# Patient Record
Sex: Female | Born: 1945 | Race: White | Hispanic: No | Marital: Married | Smoking: Never smoker
Health system: Southern US, Community
[De-identification: ages and names within clinical notes are randomized; demographics above are authoritative.]

## PROBLEM LIST (undated history)

## (undated) DIAGNOSIS — M1990 Unspecified osteoarthritis, unspecified site: Secondary | ICD-10-CM

## (undated) DIAGNOSIS — M545 Low back pain, unspecified: Secondary | ICD-10-CM

## (undated) DIAGNOSIS — T4145XA Adverse effect of unspecified anesthetic, initial encounter: Secondary | ICD-10-CM

## (undated) DIAGNOSIS — I10 Essential (primary) hypertension: Secondary | ICD-10-CM

## (undated) DIAGNOSIS — N19 Unspecified kidney failure: Secondary | ICD-10-CM

## (undated) DIAGNOSIS — Z8709 Personal history of other diseases of the respiratory system: Secondary | ICD-10-CM

## (undated) DIAGNOSIS — K429 Umbilical hernia without obstruction or gangrene: Secondary | ICD-10-CM

## (undated) DIAGNOSIS — R600 Localized edema: Secondary | ICD-10-CM

## (undated) DIAGNOSIS — T8859XA Other complications of anesthesia, initial encounter: Secondary | ICD-10-CM

## (undated) DIAGNOSIS — Z8674 Personal history of sudden cardiac arrest: Secondary | ICD-10-CM

## (undated) DIAGNOSIS — K219 Gastro-esophageal reflux disease without esophagitis: Secondary | ICD-10-CM

## (undated) DIAGNOSIS — T849XXA Unspecified complication of internal orthopedic prosthetic device, implant and graft, initial encounter: Secondary | ICD-10-CM

## (undated) DIAGNOSIS — J189 Pneumonia, unspecified organism: Secondary | ICD-10-CM

## (undated) DIAGNOSIS — G629 Polyneuropathy, unspecified: Secondary | ICD-10-CM

## (undated) DIAGNOSIS — Z8719 Personal history of other diseases of the digestive system: Secondary | ICD-10-CM

## (undated) DIAGNOSIS — F32A Depression, unspecified: Secondary | ICD-10-CM

## (undated) DIAGNOSIS — F329 Major depressive disorder, single episode, unspecified: Secondary | ICD-10-CM

## (undated) DIAGNOSIS — I639 Cerebral infarction, unspecified: Secondary | ICD-10-CM

## (undated) DIAGNOSIS — Z8619 Personal history of other infectious and parasitic diseases: Secondary | ICD-10-CM

## (undated) HISTORY — DX: Depression, unspecified: F32.A

## (undated) HISTORY — DX: Essential (primary) hypertension: I10

## (undated) HISTORY — PX: KNEE ARTHROSCOPY: SUR90

## (undated) HISTORY — DX: Unspecified osteoarthritis, unspecified site: M19.90

## (undated) HISTORY — DX: Localized edema: R60.0

## (undated) HISTORY — DX: Unspecified kidney failure: N19

## (undated) HISTORY — DX: Gastro-esophageal reflux disease without esophagitis: K21.9

## (undated) HISTORY — DX: Major depressive disorder, single episode, unspecified: F32.9

## (undated) HISTORY — PX: TONSILLECTOMY: SUR1361

## (undated) HISTORY — PX: GASTRIC BYPASS: SHX52

---

## 1991-01-30 HISTORY — PX: ABDOMINAL HYSTERECTOMY: SHX81

## 1999-05-09 ENCOUNTER — Other Ambulatory Visit: Admission: RE | Admit: 1999-05-09 | Discharge: 1999-05-09 | Payer: Self-pay | Admitting: Obstetrics & Gynecology

## 1999-05-10 ENCOUNTER — Encounter: Payer: Self-pay | Admitting: Obstetrics & Gynecology

## 1999-05-10 ENCOUNTER — Ambulatory Visit (HOSPITAL_COMMUNITY): Admission: RE | Admit: 1999-05-10 | Discharge: 1999-05-10 | Payer: Self-pay | Admitting: Obstetrics & Gynecology

## 2000-07-04 ENCOUNTER — Encounter: Admission: RE | Admit: 2000-07-04 | Discharge: 2000-10-02 | Payer: Self-pay | Admitting: Internal Medicine

## 2000-10-10 ENCOUNTER — Encounter: Payer: Self-pay | Admitting: Internal Medicine

## 2000-10-10 ENCOUNTER — Ambulatory Visit (HOSPITAL_COMMUNITY): Admission: RE | Admit: 2000-10-10 | Discharge: 2000-10-10 | Payer: Self-pay | Admitting: Internal Medicine

## 2001-01-20 ENCOUNTER — Ambulatory Visit (HOSPITAL_BASED_OUTPATIENT_CLINIC_OR_DEPARTMENT_OTHER): Admission: RE | Admit: 2001-01-20 | Discharge: 2001-01-20 | Payer: Self-pay | Admitting: *Deleted

## 2001-06-02 ENCOUNTER — Ambulatory Visit (HOSPITAL_BASED_OUTPATIENT_CLINIC_OR_DEPARTMENT_OTHER): Admission: RE | Admit: 2001-06-02 | Discharge: 2001-06-02 | Payer: Self-pay | Admitting: Orthopedic Surgery

## 2001-10-22 ENCOUNTER — Encounter: Payer: Self-pay | Admitting: Internal Medicine

## 2001-10-22 ENCOUNTER — Ambulatory Visit (HOSPITAL_COMMUNITY): Admission: RE | Admit: 2001-10-22 | Discharge: 2001-10-22 | Payer: Self-pay | Admitting: Internal Medicine

## 2003-09-08 ENCOUNTER — Encounter: Admission: RE | Admit: 2003-09-08 | Discharge: 2003-09-08 | Payer: Self-pay | Admitting: Internal Medicine

## 2004-03-22 ENCOUNTER — Encounter: Admission: RE | Admit: 2004-03-22 | Discharge: 2004-03-22 | Payer: Self-pay | Admitting: Internal Medicine

## 2004-03-22 ENCOUNTER — Ambulatory Visit: Payer: Self-pay | Admitting: Internal Medicine

## 2004-04-15 ENCOUNTER — Ambulatory Visit: Payer: Self-pay | Admitting: Family Medicine

## 2004-05-24 ENCOUNTER — Ambulatory Visit: Payer: Self-pay | Admitting: Internal Medicine

## 2004-05-29 HISTORY — PX: INCONTINENCE SURGERY: SHX676

## 2004-05-31 ENCOUNTER — Inpatient Hospital Stay (HOSPITAL_COMMUNITY): Admission: RE | Admit: 2004-05-31 | Discharge: 2004-06-02 | Payer: Self-pay | Admitting: Urology

## 2004-07-06 ENCOUNTER — Ambulatory Visit: Payer: Self-pay | Admitting: Internal Medicine

## 2004-09-08 ENCOUNTER — Ambulatory Visit: Payer: Self-pay | Admitting: Internal Medicine

## 2004-10-07 ENCOUNTER — Ambulatory Visit: Payer: Self-pay | Admitting: Endocrinology

## 2004-10-07 ENCOUNTER — Inpatient Hospital Stay (HOSPITAL_COMMUNITY): Admission: EM | Admit: 2004-10-07 | Discharge: 2004-10-11 | Payer: Self-pay | Admitting: Emergency Medicine

## 2004-10-10 ENCOUNTER — Ambulatory Visit: Payer: Self-pay | Admitting: Internal Medicine

## 2004-10-16 ENCOUNTER — Ambulatory Visit: Payer: Self-pay | Admitting: Internal Medicine

## 2004-10-29 HISTORY — PX: REPLACEMENT TOTAL KNEE: SUR1224

## 2004-11-27 ENCOUNTER — Inpatient Hospital Stay (HOSPITAL_COMMUNITY): Admission: RE | Admit: 2004-11-27 | Discharge: 2004-12-12 | Payer: Self-pay | Admitting: Orthopedic Surgery

## 2004-11-27 ENCOUNTER — Ambulatory Visit: Payer: Self-pay | Admitting: Internal Medicine

## 2004-11-29 ENCOUNTER — Ambulatory Visit: Payer: Self-pay | Admitting: Pulmonary Disease

## 2004-11-29 ENCOUNTER — Ambulatory Visit: Payer: Self-pay | Admitting: Cardiology

## 2004-11-29 ENCOUNTER — Encounter: Payer: Self-pay | Admitting: Cardiology

## 2004-12-01 ENCOUNTER — Ambulatory Visit: Payer: Self-pay | Admitting: Physical Medicine & Rehabilitation

## 2004-12-05 ENCOUNTER — Encounter: Payer: Self-pay | Admitting: Nephrology

## 2004-12-18 ENCOUNTER — Ambulatory Visit: Payer: Self-pay | Admitting: Internal Medicine

## 2004-12-22 ENCOUNTER — Ambulatory Visit: Payer: Self-pay | Admitting: Internal Medicine

## 2004-12-25 ENCOUNTER — Ambulatory Visit: Payer: Self-pay | Admitting: Internal Medicine

## 2004-12-29 ENCOUNTER — Ambulatory Visit: Payer: Self-pay | Admitting: Internal Medicine

## 2005-01-09 ENCOUNTER — Ambulatory Visit: Payer: Self-pay | Admitting: Internal Medicine

## 2005-01-18 ENCOUNTER — Ambulatory Visit: Payer: Self-pay | Admitting: Internal Medicine

## 2005-02-09 ENCOUNTER — Ambulatory Visit: Payer: Self-pay | Admitting: Internal Medicine

## 2005-02-16 ENCOUNTER — Ambulatory Visit: Payer: Self-pay | Admitting: Internal Medicine

## 2005-02-19 ENCOUNTER — Ambulatory Visit: Payer: Self-pay | Admitting: Internal Medicine

## 2005-04-12 ENCOUNTER — Ambulatory Visit: Payer: Self-pay | Admitting: Internal Medicine

## 2005-05-15 ENCOUNTER — Ambulatory Visit: Payer: Self-pay | Admitting: Internal Medicine

## 2005-07-03 ENCOUNTER — Ambulatory Visit: Payer: Self-pay | Admitting: Internal Medicine

## 2005-08-13 ENCOUNTER — Encounter: Admission: RE | Admit: 2005-08-13 | Discharge: 2005-08-13 | Payer: Self-pay | Admitting: Internal Medicine

## 2005-08-13 ENCOUNTER — Ambulatory Visit: Payer: Self-pay | Admitting: Internal Medicine

## 2005-11-01 ENCOUNTER — Ambulatory Visit: Payer: Self-pay | Admitting: Internal Medicine

## 2005-11-01 ENCOUNTER — Encounter: Payer: Self-pay | Admitting: Internal Medicine

## 2005-11-01 DIAGNOSIS — I10 Essential (primary) hypertension: Secondary | ICD-10-CM

## 2005-11-01 DIAGNOSIS — F329 Major depressive disorder, single episode, unspecified: Secondary | ICD-10-CM

## 2005-11-01 DIAGNOSIS — E1149 Type 2 diabetes mellitus with other diabetic neurological complication: Secondary | ICD-10-CM | POA: Insufficient documentation

## 2005-11-06 ENCOUNTER — Ambulatory Visit: Payer: Self-pay | Admitting: Internal Medicine

## 2006-01-29 DIAGNOSIS — I639 Cerebral infarction, unspecified: Secondary | ICD-10-CM

## 2006-01-29 HISTORY — DX: Cerebral infarction, unspecified: I63.9

## 2006-02-20 ENCOUNTER — Ambulatory Visit: Payer: Self-pay | Admitting: Internal Medicine

## 2006-02-20 LAB — CONVERTED CEMR LAB
ALT: 44 units/L — ABNORMAL HIGH (ref 0–40)
AST: 53 units/L — ABNORMAL HIGH (ref 0–37)
Albumin: 3.5 g/dL (ref 3.5–5.2)
Alkaline Phosphatase: 97 units/L (ref 39–117)
BUN: 14 mg/dL (ref 6–23)
Bilirubin, Direct: 0.2 mg/dL (ref 0.0–0.3)
CO2: 30 meq/L (ref 19–32)
Calcium: 9.3 mg/dL (ref 8.4–10.5)
Chloride: 103 meq/L (ref 96–112)
Cholesterol: 93 mg/dL (ref 0–200)
Creatinine, Ser: 1.1 mg/dL (ref 0.4–1.2)
GFR calc Af Amer: 65 mL/min
GFR calc non Af Amer: 54 mL/min
Glucose, Bld: 261 mg/dL — ABNORMAL HIGH (ref 70–99)
HDL: 31 mg/dL — ABNORMAL LOW (ref >39.0)
Hgb A1c MFr Bld: 8.6 % — ABNORMAL HIGH (ref 4.6–6.0)
LDL Cholesterol: 45 mg/dL (ref 0–99)
Potassium: 5.2 meq/L — ABNORMAL HIGH (ref 3.5–5.1)
Sodium: 137 meq/L (ref 135–145)
Total Bilirubin: 0.6 mg/dL (ref 0.3–1.2)
Total CHOL/HDL Ratio: 3
Total Protein: 6.7 g/dL (ref 6.0–8.3)
Triglycerides: 84 mg/dL (ref 0–149)
VLDL: 17 mg/dL (ref 0–40)

## 2006-04-16 ENCOUNTER — Ambulatory Visit: Payer: Self-pay | Admitting: Internal Medicine

## 2006-05-06 ENCOUNTER — Ambulatory Visit: Payer: Self-pay | Admitting: Internal Medicine

## 2006-05-28 ENCOUNTER — Ambulatory Visit: Payer: Self-pay | Admitting: Internal Medicine

## 2006-07-03 ENCOUNTER — Ambulatory Visit: Payer: Self-pay | Admitting: Internal Medicine

## 2006-07-03 LAB — CONVERTED CEMR LAB
ALT: 38 units/L (ref 0–40)
AST: 45 units/L — ABNORMAL HIGH (ref 0–37)
Albumin: 3.4 g/dL — ABNORMAL LOW (ref 3.5–5.2)
Alkaline Phosphatase: 104 units/L (ref 39–117)
BUN: 16 mg/dL (ref 6–23)
Bilirubin, Direct: 0.2 mg/dL (ref 0.0–0.3)
CO2: 30 meq/L (ref 19–32)
Calcium: 9.5 mg/dL (ref 8.4–10.5)
Chloride: 100 meq/L (ref 96–112)
Creatinine, Ser: 0.9 mg/dL (ref 0.4–1.2)
GFR calc Af Amer: 82 mL/min
GFR calc non Af Amer: 68 mL/min
Glucose, Bld: 245 mg/dL — ABNORMAL HIGH (ref 70–99)
Potassium: 4.4 meq/L (ref 3.5–5.1)
Sodium: 137 meq/L (ref 135–145)
Total Bilirubin: 0.8 mg/dL (ref 0.3–1.2)
Total Protein: 7 g/dL (ref 6.0–8.3)

## 2006-07-27 ENCOUNTER — Ambulatory Visit: Payer: Self-pay | Admitting: Internal Medicine

## 2006-08-05 ENCOUNTER — Ambulatory Visit: Payer: Self-pay | Admitting: Internal Medicine

## 2006-10-23 ENCOUNTER — Ambulatory Visit: Payer: Self-pay | Admitting: Internal Medicine

## 2006-10-24 LAB — CONVERTED CEMR LAB
ALT: 45 units/L — ABNORMAL HIGH (ref 0–35)
AST: 55 units/L — ABNORMAL HIGH (ref 0–37)
Albumin: 3.2 g/dL — ABNORMAL LOW (ref 3.5–5.2)
Alkaline Phosphatase: 122 units/L — ABNORMAL HIGH (ref 39–117)
BUN: 14 mg/dL (ref 6–23)
Bilirubin, Direct: 0.2 mg/dL (ref 0.0–0.3)
CO2: 31 meq/L (ref 19–32)
Calcium: 8.9 mg/dL (ref 8.4–10.5)
Chloride: 100 meq/L (ref 96–112)
Cholesterol: 89 mg/dL (ref 0–200)
Creatinine, Ser: 1 mg/dL (ref 0.4–1.2)
GFR calc Af Amer: 73 mL/min
GFR calc non Af Amer: 60 mL/min
Glucose, Bld: 493 mg/dL — ABNORMAL HIGH (ref 70–99)
HDL: 24.1 mg/dL — ABNORMAL LOW (ref >39.0)
Hgb A1c MFr Bld: 11.4 % — ABNORMAL HIGH (ref 4.6–6.0)
LDL Cholesterol: 37 mg/dL (ref 0–99)
Potassium: 4.6 meq/L (ref 3.5–5.1)
Sodium: 136 meq/L (ref 135–145)
Total Bilirubin: 0.7 mg/dL (ref 0.3–1.2)
Total CHOL/HDL Ratio: 3.7
Total Protein: 6.9 g/dL (ref 6.0–8.3)
Triglycerides: 139 mg/dL (ref 0–149)
VLDL: 28 mg/dL (ref 0–40)

## 2006-10-29 ENCOUNTER — Telehealth: Payer: Self-pay | Admitting: Internal Medicine

## 2006-10-29 ENCOUNTER — Ambulatory Visit: Payer: Self-pay | Admitting: Internal Medicine

## 2006-11-12 ENCOUNTER — Ambulatory Visit: Payer: Self-pay | Admitting: Internal Medicine

## 2006-11-27 ENCOUNTER — Ambulatory Visit: Payer: Self-pay | Admitting: Internal Medicine

## 2007-01-09 ENCOUNTER — Ambulatory Visit: Payer: Self-pay | Admitting: Internal Medicine

## 2007-01-24 ENCOUNTER — Telehealth: Payer: Self-pay | Admitting: Internal Medicine

## 2007-02-11 ENCOUNTER — Encounter: Payer: Self-pay | Admitting: Internal Medicine

## 2007-02-28 ENCOUNTER — Ambulatory Visit: Payer: Self-pay | Admitting: Internal Medicine

## 2007-03-03 LAB — CONVERTED CEMR LAB
ALT: 50 units/L — ABNORMAL HIGH (ref 0–35)
AST: 60 units/L — ABNORMAL HIGH (ref 0–37)
Albumin: 3.5 g/dL (ref 3.5–5.2)
BUN: 13 mg/dL (ref 6–23)
Bilirubin, Direct: 0.3 mg/dL (ref 0.0–0.3)
CO2: 29 meq/L (ref 19–32)
Calcium: 9.5 mg/dL (ref 8.4–10.5)
Chloride: 101 meq/L (ref 96–112)
Cholesterol: 92 mg/dL (ref 0–200)
GFR calc Af Amer: 82 mL/min
GFR calc non Af Amer: 68 mL/min
Glucose, Bld: 177 mg/dL — ABNORMAL HIGH (ref 70–99)
HDL: 26.6 mg/dL — ABNORMAL LOW (ref >39.0)
Hgb A1c MFr Bld: 9.4 % — ABNORMAL HIGH (ref 4.6–6.0)
LDL Cholesterol: 52 mg/dL (ref 0–99)
Potassium: 4.2 meq/L (ref 3.5–5.1)
Sodium: 138 meq/L (ref 135–145)
Total Bilirubin: 0.9 mg/dL (ref 0.3–1.2)
Total CHOL/HDL Ratio: 3.5
Total Protein: 7.1 g/dL (ref 6.0–8.3)
Triglycerides: 65 mg/dL (ref 0–149)
VLDL: 13 mg/dL (ref 0–40)

## 2007-03-07 ENCOUNTER — Ambulatory Visit: Payer: Self-pay | Admitting: Internal Medicine

## 2007-05-06 ENCOUNTER — Ambulatory Visit: Payer: Self-pay | Admitting: Internal Medicine

## 2007-06-03 ENCOUNTER — Ambulatory Visit: Payer: Self-pay | Admitting: Internal Medicine

## 2007-06-05 LAB — CONVERTED CEMR LAB
BUN: 13 mg/dL (ref 6–23)
Basophils Absolute: 0 10*3/uL (ref 0.0–0.1)
Basophils Relative: 0.2 % (ref 0.0–1.0)
CO2: 31 meq/L (ref 19–32)
Calcium: 9 mg/dL (ref 8.4–10.5)
Chloride: 103 meq/L (ref 96–112)
Cholesterol: 90 mg/dL (ref 0–200)
Creatinine, Ser: 1 mg/dL (ref 0.4–1.2)
Eosinophils Absolute: 0.3 10*3/uL (ref 0.0–0.7)
Eosinophils Relative: 2.8 % (ref 0.0–5.0)
GFR calc Af Amer: 73 mL/min
GFR calc non Af Amer: 60 mL/min
Glucose, Bld: 153 mg/dL — ABNORMAL HIGH (ref 70–99)
HCT: 36.6 % (ref 36.0–46.0)
HDL: 26.1 mg/dL — ABNORMAL LOW (ref >39.0)
Hemoglobin: 12.5 g/dL (ref 12.0–15.0)
Hgb A1c MFr Bld: 8 % — ABNORMAL HIGH (ref 4.6–6.0)
LDL Cholesterol: 47 mg/dL (ref 0–99)
Lymphocytes Relative: 29.3 % (ref 12.0–46.0)
MCHC: 34.2 g/dL (ref 30.0–36.0)
MCV: 86.2 fL (ref 78.0–100.0)
Monocytes Absolute: 0.4 10*3/uL (ref 0.1–1.0)
Monocytes Relative: 3.6 % (ref 3.0–12.0)
Neutro Abs: 6.2 10*3/uL (ref 1.4–7.7)
Neutrophils Relative %: 64.1 % (ref 43.0–77.0)
Platelets: 430 10*3/uL — ABNORMAL HIGH (ref 150–400)
Potassium: 4.2 meq/L (ref 3.5–5.1)
RBC: 4.24 M/uL (ref 3.87–5.11)
RDW: 13.8 % (ref 11.5–14.6)
Sodium: 139 meq/L (ref 135–145)
Total CHOL/HDL Ratio: 3.4
Triglycerides: 86 mg/dL (ref 0–149)
VLDL: 17 mg/dL (ref 0–40)
WBC: 9.8 10*3/uL (ref 4.5–10.5)

## 2007-06-30 ENCOUNTER — Ambulatory Visit: Payer: Self-pay | Admitting: Internal Medicine

## 2007-07-14 ENCOUNTER — Telehealth: Payer: Self-pay | Admitting: Internal Medicine

## 2007-10-23 ENCOUNTER — Ambulatory Visit: Payer: Self-pay | Admitting: Internal Medicine

## 2007-10-23 DIAGNOSIS — E669 Obesity, unspecified: Secondary | ICD-10-CM

## 2007-10-24 LAB — CONVERTED CEMR LAB
ALT: 42 units/L — ABNORMAL HIGH (ref 0–35)
AST: 44 units/L — ABNORMAL HIGH (ref 0–37)
CO2: 25 meq/L (ref 19–32)
Calcium: 9.6 mg/dL (ref 8.4–10.5)
Chloride: 105 meq/L (ref 96–112)
Cholesterol: 86 mg/dL (ref 0–200)
Creatinine, Ser: 1.1 mg/dL (ref 0.4–1.2)
GFR calc Af Amer: 65 mL/min
GFR calc non Af Amer: 54 mL/min
HDL: 27 mg/dL — ABNORMAL LOW (ref >39.0)
Hgb A1c MFr Bld: 6.7 % — ABNORMAL HIGH (ref 4.6–6.0)
Potassium: 4.1 meq/L (ref 3.5–5.1)
Sodium: 139 meq/L (ref 135–145)
Total CHOL/HDL Ratio: 3.2
Triglycerides: 56 mg/dL (ref 0–149)
VLDL: 11 mg/dL (ref 0–40)

## 2007-10-30 ENCOUNTER — Telehealth: Payer: Self-pay | Admitting: Internal Medicine

## 2008-01-02 ENCOUNTER — Inpatient Hospital Stay (HOSPITAL_COMMUNITY): Admission: EM | Admit: 2008-01-02 | Discharge: 2008-01-03 | Payer: Self-pay | Admitting: Emergency Medicine

## 2008-01-02 ENCOUNTER — Ambulatory Visit: Payer: Self-pay | Admitting: Internal Medicine

## 2008-01-02 ENCOUNTER — Telehealth: Payer: Self-pay | Admitting: Internal Medicine

## 2008-01-05 ENCOUNTER — Encounter (INDEPENDENT_AMBULATORY_CARE_PROVIDER_SITE_OTHER): Payer: Self-pay | Admitting: *Deleted

## 2008-01-05 ENCOUNTER — Ambulatory Visit: Payer: Self-pay | Admitting: Internal Medicine

## 2008-01-05 DIAGNOSIS — I639 Cerebral infarction, unspecified: Secondary | ICD-10-CM | POA: Insufficient documentation

## 2008-01-09 ENCOUNTER — Ambulatory Visit: Payer: Self-pay

## 2008-01-09 ENCOUNTER — Encounter: Payer: Self-pay | Admitting: Internal Medicine

## 2008-02-04 ENCOUNTER — Ambulatory Visit: Payer: Self-pay | Admitting: Internal Medicine

## 2008-02-04 LAB — CONVERTED CEMR LAB
ALT: 36 units/L — ABNORMAL HIGH (ref 0–35)
AST: 44 units/L — ABNORMAL HIGH (ref 0–37)
Albumin: 3.3 g/dL — ABNORMAL LOW (ref 3.5–5.2)
Alkaline Phosphatase: 76 units/L (ref 39–117)
Bilirubin, Direct: 0.2 mg/dL (ref 0.0–0.3)
Cholesterol: 64 mg/dL (ref 0–200)
HDL: 23.1 mg/dL — ABNORMAL LOW (ref >39.0)
Total CHOL/HDL Ratio: 2.8
Total Protein: 6.9 g/dL (ref 6.0–8.3)
Triglycerides: 43 mg/dL (ref 0–149)
VLDL: 9 mg/dL (ref 0–40)

## 2008-02-10 ENCOUNTER — Ambulatory Visit: Payer: Self-pay | Admitting: Internal Medicine

## 2008-02-10 DIAGNOSIS — E785 Hyperlipidemia, unspecified: Secondary | ICD-10-CM

## 2008-03-15 ENCOUNTER — Telehealth: Payer: Self-pay | Admitting: Internal Medicine

## 2008-03-16 ENCOUNTER — Encounter: Payer: Self-pay | Admitting: Internal Medicine

## 2008-04-01 ENCOUNTER — Telehealth: Payer: Self-pay | Admitting: Internal Medicine

## 2008-04-08 ENCOUNTER — Telehealth: Payer: Self-pay | Admitting: Internal Medicine

## 2008-05-18 ENCOUNTER — Ambulatory Visit: Payer: Self-pay | Admitting: Internal Medicine

## 2008-05-18 LAB — CONVERTED CEMR LAB
ALT: 37 units/L — ABNORMAL HIGH (ref 0–35)
Albumin: 3.5 g/dL (ref 3.5–5.2)
BUN: 20 mg/dL (ref 6–23)
Bilirubin, Direct: 0.1 mg/dL (ref 0.0–0.3)
CO2: 28 meq/L (ref 19–32)
Calcium: 9.5 mg/dL (ref 8.4–10.5)
Chloride: 103 meq/L (ref 96–112)
Creatinine, Ser: 1.1 mg/dL (ref 0.4–1.2)
GFR calc non Af Amer: 53.58 mL/min (ref >60)
Glucose, Bld: 172 mg/dL — ABNORMAL HIGH (ref 70–99)
HDL: 30.1 mg/dL — ABNORMAL LOW (ref >39.00)
Potassium: 4.5 meq/L (ref 3.5–5.1)
Total Bilirubin: 0.6 mg/dL (ref 0.3–1.2)
Total CHOL/HDL Ratio: 3
Total Protein: 7.3 g/dL (ref 6.0–8.3)
Triglycerides: 80 mg/dL (ref 0.0–149.0)
VLDL: 16 mg/dL (ref 0.0–40.0)

## 2008-05-25 ENCOUNTER — Ambulatory Visit: Payer: Self-pay | Admitting: Internal Medicine

## 2008-09-15 ENCOUNTER — Ambulatory Visit: Payer: Self-pay | Admitting: Internal Medicine

## 2008-09-15 LAB — CONVERTED CEMR LAB
ALT: 36 units/L — ABNORMAL HIGH (ref 0–35)
AST: 38 units/L — ABNORMAL HIGH (ref 0–37)
Albumin: 3.3 g/dL — ABNORMAL LOW (ref 3.5–5.2)
Alkaline Phosphatase: 78 units/L (ref 39–117)
BUN: 15 mg/dL (ref 6–23)
Bilirubin, Direct: 0.2 mg/dL (ref 0.0–0.3)
CO2: 30 meq/L (ref 19–32)
Calcium: 9.1 mg/dL (ref 8.4–10.5)
Chloride: 108 meq/L (ref 96–112)
Cholesterol: 78 mg/dL (ref 0–200)
GFR calc non Af Amer: 77.29 mL/min (ref >60)
Glucose, Bld: 171 mg/dL — ABNORMAL HIGH (ref 70–99)
HDL: 30.4 mg/dL — ABNORMAL LOW (ref >39.00)
Hgb A1c MFr Bld: 7.5 % — ABNORMAL HIGH (ref 4.6–6.5)
LDL Cholesterol: 33 mg/dL (ref 0–99)
Potassium: 4.4 meq/L (ref 3.5–5.1)
Sodium: 142 meq/L (ref 135–145)
Total Bilirubin: 0.7 mg/dL (ref 0.3–1.2)
Total CHOL/HDL Ratio: 3
Total Protein: 7.1 g/dL (ref 6.0–8.3)
Triglycerides: 75 mg/dL (ref 0.0–149.0)
VLDL: 15 mg/dL (ref 0.0–40.0)

## 2008-09-22 ENCOUNTER — Ambulatory Visit: Payer: Self-pay | Admitting: Internal Medicine

## 2008-10-05 ENCOUNTER — Telehealth: Payer: Self-pay | Admitting: Internal Medicine

## 2008-11-23 ENCOUNTER — Ambulatory Visit: Payer: Self-pay | Admitting: Internal Medicine

## 2008-12-31 ENCOUNTER — Telehealth: Payer: Self-pay | Admitting: Internal Medicine

## 2009-01-03 ENCOUNTER — Ambulatory Visit: Payer: Self-pay | Admitting: Internal Medicine

## 2009-01-05 LAB — CONVERTED CEMR LAB
ALT: 46 units/L — ABNORMAL HIGH (ref 0–35)
AST: 50 units/L — ABNORMAL HIGH (ref 0–37)
Albumin: 3.6 g/dL (ref 3.5–5.2)
Alkaline Phosphatase: 92 units/L (ref 39–117)
BUN: 16 mg/dL (ref 6–23)
Basophils Absolute: 0.1 10*3/uL (ref 0.0–0.1)
Basophils Relative: 0.8 % (ref 0.0–3.0)
Bilirubin, Direct: 0.1 mg/dL (ref 0.0–0.3)
CO2: 29 meq/L (ref 19–32)
Calcium: 9.5 mg/dL (ref 8.4–10.5)
Chloride: 98 meq/L (ref 96–112)
Cholesterol: 91 mg/dL (ref 0–200)
Creatinine, Ser: 1 mg/dL (ref 0.4–1.2)
Eosinophils Absolute: 0.2 10*3/uL (ref 0.0–0.7)
Eosinophils Relative: 1.7 % (ref 0.0–5.0)
GFR calc non Af Amer: 59.69 mL/min (ref >60)
Glucose, Bld: 230 mg/dL — ABNORMAL HIGH (ref 70–99)
HCT: 37.8 % (ref 36.0–46.0)
HDL: 32.2 mg/dL — ABNORMAL LOW (ref >39.00)
Hemoglobin: 12.7 g/dL (ref 12.0–15.0)
Hgb A1c MFr Bld: 10.2 % — ABNORMAL HIGH (ref 4.6–6.5)
LDL Cholesterol: 42 mg/dL (ref 0–99)
Lymphocytes Relative: 29.5 % (ref 12.0–46.0)
Lymphs Abs: 2.8 10*3/uL (ref 0.7–4.0)
MCHC: 33.6 g/dL (ref 30.0–36.0)
MCV: 85.9 fL (ref 78.0–100.0)
Monocytes Absolute: 0.7 10*3/uL (ref 0.1–1.0)
Monocytes Relative: 6.9 % (ref 3.0–12.0)
Neutro Abs: 5.8 10*3/uL (ref 1.4–7.7)
Neutrophils Relative %: 61.1 % (ref 43.0–77.0)
Platelets: 405 10*3/uL — ABNORMAL HIGH (ref 150.0–400.0)
Potassium: 4.4 meq/L (ref 3.5–5.1)
RBC: 4.41 M/uL (ref 3.87–5.11)
RDW: 15.3 % — ABNORMAL HIGH (ref 11.5–14.6)
Sodium: 137 meq/L (ref 135–145)
TSH: 4.08 microintl units/mL (ref 0.35–5.50)
Total Bilirubin: 0.7 mg/dL (ref 0.3–1.2)
Total CHOL/HDL Ratio: 3
Total Protein: 8.3 g/dL (ref 6.0–8.3)
Triglycerides: 84 mg/dL (ref 0.0–149.0)
VLDL: 16.8 mg/dL (ref 0.0–40.0)
WBC: 9.6 10*3/uL (ref 4.5–10.5)

## 2009-01-10 ENCOUNTER — Telehealth: Payer: Self-pay | Admitting: Internal Medicine

## 2009-01-29 LAB — HM DIABETES EYE EXAM: HM Diabetic Eye Exam: NORMAL

## 2009-03-01 ENCOUNTER — Ambulatory Visit: Payer: Self-pay | Admitting: Internal Medicine

## 2009-04-12 DIAGNOSIS — K219 Gastro-esophageal reflux disease without esophagitis: Secondary | ICD-10-CM | POA: Insufficient documentation

## 2009-04-13 ENCOUNTER — Ambulatory Visit: Payer: Self-pay | Admitting: Internal Medicine

## 2009-04-13 LAB — CONVERTED CEMR LAB
ALT: 27 units/L (ref 0–35)
AST: 27 units/L (ref 0–37)
Alkaline Phosphatase: 89 units/L (ref 39–117)
Bilirubin, Direct: 0.2 mg/dL (ref 0.0–0.3)
CO2: 30 meq/L (ref 19–32)
Chloride: 106 meq/L (ref 96–112)
GFR calc non Af Amer: 67.34 mL/min (ref >60)
Glucose, Bld: 227 mg/dL — ABNORMAL HIGH (ref 70–99)
HDL: 37.3 mg/dL — ABNORMAL LOW (ref >39.00)
Hgb A1c MFr Bld: 7.8 % — ABNORMAL HIGH (ref 4.6–6.5)
Sodium: 140 meq/L (ref 135–145)
Total Bilirubin: 0.5 mg/dL (ref 0.3–1.2)
Total CHOL/HDL Ratio: 2
Total Protein: 7.1 g/dL (ref 6.0–8.3)
Triglycerides: 74 mg/dL (ref 0.0–149.0)
VLDL: 14.8 mg/dL (ref 0.0–40.0)

## 2009-04-20 ENCOUNTER — Ambulatory Visit: Payer: Self-pay | Admitting: Internal Medicine

## 2009-04-20 DIAGNOSIS — G894 Chronic pain syndrome: Secondary | ICD-10-CM | POA: Insufficient documentation

## 2009-05-13 ENCOUNTER — Ambulatory Visit: Payer: Self-pay | Admitting: Internal Medicine

## 2009-05-13 DIAGNOSIS — R609 Edema, unspecified: Secondary | ICD-10-CM | POA: Insufficient documentation

## 2009-05-27 ENCOUNTER — Ambulatory Visit: Payer: Self-pay | Admitting: Internal Medicine

## 2009-06-09 ENCOUNTER — Telehealth: Payer: Self-pay | Admitting: Internal Medicine

## 2009-07-15 ENCOUNTER — Ambulatory Visit: Payer: Self-pay | Admitting: Internal Medicine

## 2009-07-26 ENCOUNTER — Telehealth: Payer: Self-pay | Admitting: Internal Medicine

## 2009-08-03 ENCOUNTER — Telehealth: Payer: Self-pay | Admitting: Internal Medicine

## 2009-10-11 ENCOUNTER — Ambulatory Visit: Payer: Self-pay | Admitting: Internal Medicine

## 2009-10-14 LAB — CONVERTED CEMR LAB
AST: 38 units/L — ABNORMAL HIGH (ref 0–37)
Albumin: 3.7 g/dL (ref 3.5–5.2)
Alkaline Phosphatase: 83 units/L (ref 39–117)
BUN: 15 mg/dL (ref 6–23)
Bilirubin, Direct: 0.2 mg/dL (ref 0.0–0.3)
CO2: 29 meq/L (ref 19–32)
Calcium: 9.6 mg/dL (ref 8.4–10.5)
Chloride: 100 meq/L (ref 96–112)
GFR calc non Af Amer: 64.74 mL/min (ref >60)
Glucose, Bld: 180 mg/dL — ABNORMAL HIGH (ref 70–99)
HDL: 30.1 mg/dL — ABNORMAL LOW (ref >39.00)
Hgb A1c MFr Bld: 9.6 % — ABNORMAL HIGH (ref 4.6–6.5)
LDL Cholesterol: 47 mg/dL (ref 0–99)
Potassium: 4.3 meq/L (ref 3.5–5.1)
Sodium: 140 meq/L (ref 135–145)
Total Bilirubin: 0.7 mg/dL (ref 0.3–1.2)
Total CHOL/HDL Ratio: 3
Total CK: 70 units/L (ref 7–177)
Total Protein: 7.3 g/dL (ref 6.0–8.3)
Triglycerides: 94 mg/dL (ref 0.0–149.0)
VLDL: 18.8 mg/dL (ref 0.0–40.0)

## 2009-10-28 ENCOUNTER — Ambulatory Visit: Payer: Self-pay | Admitting: Internal Medicine

## 2010-01-02 ENCOUNTER — Ambulatory Visit: Payer: Self-pay | Admitting: Internal Medicine

## 2010-01-11 DIAGNOSIS — N183 Chronic kidney disease, stage 3 (moderate): Secondary | ICD-10-CM

## 2010-01-17 ENCOUNTER — Ambulatory Visit: Payer: Self-pay | Admitting: Internal Medicine

## 2010-01-17 DIAGNOSIS — K439 Ventral hernia without obstruction or gangrene: Secondary | ICD-10-CM

## 2010-02-10 ENCOUNTER — Ambulatory Visit
Admission: RE | Admit: 2010-02-10 | Discharge: 2010-02-10 | Payer: Self-pay | Source: Home / Self Care | Attending: Internal Medicine | Admitting: Internal Medicine

## 2010-02-10 ENCOUNTER — Other Ambulatory Visit: Payer: Self-pay | Admitting: Internal Medicine

## 2010-02-10 LAB — HEPATIC FUNCTION PANEL
ALT: 39 U/L — ABNORMAL HIGH (ref 0–35)
AST: 43 U/L — ABNORMAL HIGH (ref 0–37)
Albumin: 3.4 g/dL — ABNORMAL LOW (ref 3.5–5.2)
Alkaline Phosphatase: 92 U/L (ref 39–117)
Bilirubin, Direct: 0.1 mg/dL (ref 0.0–0.3)
Total Bilirubin: 0.8 mg/dL (ref 0.3–1.2)
Total Protein: 7.2 g/dL (ref 6.0–8.3)

## 2010-02-10 LAB — BASIC METABOLIC PANEL
BUN: 14 mg/dL (ref 6–23)
CO2: 30 mEq/L (ref 19–32)
Calcium: 9.3 mg/dL (ref 8.4–10.5)
Chloride: 98 mEq/L (ref 96–112)
Creatinine, Ser: 1 mg/dL (ref 0.4–1.2)
GFR: 57.48 mL/min — ABNORMAL LOW (ref >60.00)
Glucose, Bld: 277 mg/dL — ABNORMAL HIGH (ref 70–99)
Potassium: 4.5 mEq/L (ref 3.5–5.1)
Sodium: 138 mEq/L (ref 135–145)

## 2010-02-10 LAB — LIPID PANEL
Cholesterol: 92 mg/dL (ref 0–200)
HDL: 29.8 mg/dL — ABNORMAL LOW (ref >39.00)
LDL Cholesterol: 46 mg/dL (ref 0–99)
Total CHOL/HDL Ratio: 3
Triglycerides: 82 mg/dL (ref 0.0–149.0)
VLDL: 16.4 mg/dL (ref 0.0–40.0)

## 2010-02-10 LAB — HEMOGLOBIN A1C: Hgb A1c MFr Bld: 9.2 % — ABNORMAL HIGH (ref 4.6–6.5)

## 2010-02-17 ENCOUNTER — Ambulatory Visit
Admission: RE | Admit: 2010-02-17 | Discharge: 2010-02-17 | Payer: Self-pay | Source: Home / Self Care | Attending: Internal Medicine | Admitting: Internal Medicine

## 2010-02-19 ENCOUNTER — Encounter: Payer: Self-pay | Admitting: Internal Medicine

## 2010-02-28 NOTE — Assessment & Plan Note (Signed)
Summary: 2 wk rov/njr   Vital Signs:  Patient profile:   65 year old female Pulse rate:   80 / minute Pulse rhythm:   regular Resp:     12 per minute BP sitting:   120 / 76  (left arm) Cuff size:   large  Vitals Entered By: Gladis Riffle, RN (May 27, 2009 11:33 AM)  Procedure Note  Mole Biopsy/Removal:    Size (in cm): 0.5 x 0.5    Location: face    Comment: verbal consent liquid ntrogen    Size (in cm): 0.6 x 0.7    Location: anterior thigh    Comment: liquid nitrogen  Skin Tag Removal:  Procedure # 1: skin tag(s) removed    Region: anterior    Location: axilla    # lesions removed: 1    Comment: verbal consent  CC: skintag removal right chest, also facial lesion left face--has stopped byetta and feeling better--CBGs 100-165 at home--continues edema Is Patient Diabetic? Yes Did you bring your meter with you today? No   CC:  skintag removal right chest and also facial lesion left face--has stopped byetta and feeling better--CBGs 100-165 at home--continues edema.  Preventive Screening-Counseling & Management  Alcohol-Tobacco     Smoking Status: never     Smoking Cessation Counseling: no  Current Medications (verified): 1)  Micardis 80 Mg  Tabs (Telmisartan) .... One By Mouth Daily 2)  Glipizide 10 Mg Tabs (Glipizide) .... Take 1 Tablet By Mouth Once A Day 3)  Potassium Chloride Crys Cr 20 Meq Cr-Tabs (Potassium Chloride Crys Cr) .... Take 1 Tablet By Mouth Two Times A Day 4)  Furosemide 40 Mg Tabs (Furosemide) .... Take 1 Tablet By Mouth Twice A Day 5)  Vicodin 5-500 Mg Tabs (Hydrocodone-Acetaminophen) .... Take 1 Tablet By Mouth Two Times A Day As Needed 6)  Freestyle Test   Strp (Glucose Blood) .... Use Qid or As Directed 7)  Freestyle Lancets   Misc (Lancets) .... Use As Directed 8)  Colace 100 Mg Caps (Docusate Sodium) .... Once Daily 9)  Metronidazole 0.75 % Gel (Metronidazole) .... Apply At Bedtime 10)  Flexeril 10 Mg Tabs (Cyclobenzaprine Hcl) .... Take 1  Tablet By Mouth Two Times A Day As Needed Back Spasms 11)  Anusol-Hc 2.5 % Crea (Hydrocortisone) .... Apply To Rectum As Needed For Hemmorhoidal Discomfort 12)  Humulin 70/30 70-30 % Susp (Insulin Isophane & Regular) .... Inject Subcutaneously 15 Units With Breakfast and 10 Units With Dinner and Increase  As Directed 13)  Bd Insulin Syringe Microfine 28g X 1/2" 0.5 Ml Misc (Insulin Syringe-Needle U-100) .... Use As Directed With Insulin Two Times A Day 14)  Omeprazole 20 Mg Cpdr (Omeprazole) .... One By Mouth Daily  Allergies (verified): No Known Drug Allergies   Complete Medication List: 1)  Micardis 80 Mg Tabs (Telmisartan) .... One by mouth daily 2)  Glipizide 10 Mg Tabs (Glipizide) .... Take 1 tablet by mouth once a day 3)  Potassium Chloride Crys Cr 20 Meq Cr-tabs (Potassium chloride crys cr) .... Take 1 tablet by mouth two times a day 4)  Furosemide 40 Mg Tabs (Furosemide) .... Take 1 tablet by mouth twice a day 5)  Vicodin 5-500 Mg Tabs (Hydrocodone-acetaminophen) .... Take 1 tablet by mouth two times a day as needed 6)  Freestyle Test Strp (Glucose blood) .... Use qid or as directed 7)  Freestyle Lancets Misc (Lancets) .... Use as directed 8)  Colace 100 Mg Caps (Docusate sodium) .... Once  daily 9)  Metronidazole 0.75 % Gel (Metronidazole) .... Apply at bedtime 10)  Flexeril 10 Mg Tabs (Cyclobenzaprine hcl) .... Take 1 tablet by mouth two times a day as needed back spasms 11)  Anusol-hc 2.5 % Crea (Hydrocortisone) .... Apply to rectum as needed for hemmorhoidal discomfort 12)  Humulin 70/30 70-30 % Susp (Insulin isophane & regular) .... Inject subcutaneously 15 units with breakfast and 10 units with dinner and increase  as directed 13)  Bd Insulin Syringe Microfine 28g X 1/2" 0.5 Ml Misc (Insulin syringe-needle u-100) .... Use as directed with insulin two times a day 14)  Omeprazole 20 Mg Cpdr (Omeprazole) .... One by mouth daily  Other Orders: Cryotherapy/Destruction benign or  premalignant lesion (1st lesion)  (17000) Cryotherapy/Destruction benign or premalignant lesion (2nd-14th lesions) (17003) Skin Tags (up to 15) - [facility] (11200)

## 2010-02-28 NOTE — Assessment & Plan Note (Signed)
Summary: CONSULT RE: RIGH KNEE PAIN/CJR   Vital Signs:  Patient profile:   65 year old female Temp:     98.7 degrees F oral BP sitting:   150 / 92  (left arm) Cuff size:   large  Vitals Entered By: Sid Falcon LPN (October 28, 2009 10:52 AM)  Procedure Note Last Tetanus: Historical (06/01/2004)  Injections: Duration of symptoms: 1 week  Procedure # 1: joint aspiration & injection    Location: right knee    Technique: 20 g needle    Medication: 40 mg depomedrol    Anesthesia: 1% lidocaine w/o epinephrine    Comment: verbal consent   History of Present Illness: Knee pain x 8 days woke up with knee pain pain was severe treated with ice and rest gradually improved over 3 days pain recurred yesterday 10/10 pain ice with some relief  All other systems reviewed and were negative   Allergies (verified): 1)  Ibuprofen (Ibuprofen)  Past History:  Past Medical History: Last updated: 04/12/2009 Depression Diabetes mellitus, type II Hypertension Renal failure post op osteo arthritis shingles 2008 Hyperlipidemia GERD  Past Surgical History: Last updated: 11/01/2005 knee arthroscopies (bilateral) gastric banding bladder sling Total knee replacement  Social History: Last updated: 06/30/2007 Married Never Smoked Regular exercise-no a lot of stress related to bipolar daughter  Risk Factors: Exercise: no (05/06/2006)  Risk Factors: Smoking Status: never (07/15/2009)  Physical Exam  General:  morbidly obese female in no acute distress. HEENT exam atraumatic, normocephalic. She is walking with a cane. She has full flexion and extension of both knees. Full flexion causes significant pain of right knee. No obvious effusion but her knees her quite large and an effusion would be difficult to identify.   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 She had significant trouble with knee  replacement of the other knee. Not a surgical candidate at this time.  Discussed risks and benefits of further treatment. She agrees to proceed with aspiration and injection of knee. Side effects discussed. She will watch out for hyperglycemia. Her updated medication list for this problem includes:    Vicodin 5-500 Mg Tabs (Hydrocodone-acetaminophen) .Marland Kitchen... Take 1 tablet by mouth two times a day as needed    Flexeril 10 Mg Tabs (Cyclobenzaprine hcl) .Marland Kitchen... Take 1 tablet by mouth two times a day as needed back spasms  Complete Medication List: 1)  Micardis 80 Mg Tabs (Telmisartan) .... One by mouth daily 2)  Glipizide 10 Mg Tabs (Glipizide) .... Take 1 tablet by mouth once a day 3)  Potassium Chloride Crys Cr 20 Meq Cr-tabs (Potassium chloride crys cr) .... Take 1 tablet by mouth two times a day 4)  Furosemide 40 Mg Tabs (Furosemide) .... Take 1 tablet by mouth twice a day 5)  Vicodin 5-500 Mg Tabs (Hydrocodone-acetaminophen) .... Take 1 tablet by mouth two times a day as needed 6)  Freestyle Test Strp (Glucose blood) .... Use qid or as directed 7)  Freestyle Lancets Misc (Lancets) .... Use as directed 8)  Colace 100 Mg Caps (Docusate sodium) .... Once daily 9)  Metronidazole 0.75 % Gel (Metronidazole) .... Apply at bedtime 10)  Flexeril 10 Mg Tabs (Cyclobenzaprine hcl) .... Take 1 tablet by mouth two times a day as needed back spasms 11)  Anusol-hc 2.5 % Crea (Hydrocortisone) .... Apply to rectum as needed for hemmorhoidal discomfort 12)  Humulin 70/30 70-30 % Susp (Insulin isophane & regular) .... Inject  subcutaneously 15 units with breakfast and 10 units with dinner and increase  as directed 13)  Bd Insulin Syringe Microfine 28g X 1/2" 0.5 Ml Misc (Insulin syringe-needle u-100) .... Use as directed with insulin two times a day 14)  Prilosec Otc 20 Mg Tbec (Omeprazole magnesium) .... Once daily  Other Orders: Depo- Medrol 40mg  (J1030) Joint Aspirate / Injection, Large (20610)

## 2010-02-28 NOTE — Progress Notes (Signed)
Summary: bronchitis?  Phone Note Call from Patient   Caller: Patient Call For: Birdie Sons MD Summary of Call: Son is being treated with antibiotics by Dr. Fabian Sharp for sore throat......? strept.  Pt is running fever 100.2, coughing up (green), extremely weak, and feels like bronchitis.  Would like antibiotic called to [facility]. [phone] Initial call taken by: Lynann Beaver CMA,  Jun 09, 2009 10:51 AM  Follow-up for Phone Call        see Rx Follow-up by: Birdie Sons MD,  Jun 09, 2009 12:26 PM    New/Updated Medications: DOXYCYCLINE HYCLATE 100 MG CAPS (DOXYCYCLINE HYCLATE) Take 1 tab twice a day Prescriptions: DOXYCYCLINE HYCLATE 100 MG CAPS (DOXYCYCLINE HYCLATE) Take 1 tab twice a day  #20 x 0   Entered and Authorized by:   Birdie Sons MD   Signed by:   Birdie Sons MD on 06/09/2009   Method used:   Electronically to        [facility]* (retail)       [address]       [place], [place]  [id number]       Ph: [phone]       Fax: [phone]   RxID:   [id number]  Pt. notified.

## 2010-02-28 NOTE — Assessment & Plan Note (Signed)
Summary: fup//ccm   Vital Signs:  Patient profile:   65 year old female Pulse rate:   76 / minute Pulse rhythm:   regular Resp:     12 per minute BP sitting:   148 / 74  (left arm) Cuff size:   large  Vitals Entered By: Gladis Riffle, RN (July 15, 2009 11:48 AM) CC: FU-- Is Patient Diabetic? Yes Did you bring your meter with you today? No  Does patient need assistance? Functional Status Cook/clean, Shopping Comments CBGs 140-185 in AM, >100 in afternoon   CC:  FU--.  History of Present Illness:  Follow-Up Visit      This is a 65 year old woman who presents for Follow-up visit.  The patient denies chest pain and palpitations.  Since the last visit the patient notes no new problems or concerns except chronic back pain---takes hydrocodone one daily.  The patient reports taking meds as prescribed.  When questioned about possible medication side effects, the patient notes none.    All other systems reviewed and were negative   Preventive Screening-Counseling & Management  Alcohol-Tobacco     Smoking Status: never     Smoking Cessation Counseling: no  Current Medications (verified): 1)  Micardis 80 Mg  Tabs (Telmisartan) .... One By Mouth Daily 2)  Glipizide 10 Mg Tabs (Glipizide) .... Take 1 Tablet By Mouth Once A Day 3)  Potassium Chloride Crys Cr 20 Meq Cr-Tabs (Potassium Chloride Crys Cr) .... Take 1 Tablet By Mouth Two Times A Day 4)  Furosemide 40 Mg Tabs (Furosemide) .... Take 1 Tablet By Mouth Twice A Day 5)  Vicodin 5-500 Mg Tabs (Hydrocodone-Acetaminophen) .... Take 1 Tablet By Mouth Two Times A Day As Needed 6)  Freestyle Test   Strp (Glucose Blood) .... Use Qid or As Directed 7)  Freestyle Lancets   Misc (Lancets) .... Use As Directed 8)  Colace 100 Mg Caps (Docusate Sodium) .... Once Daily 9)  Metronidazole 0.75 % Gel (Metronidazole) .... Apply At Bedtime 10)  Flexeril 10 Mg Tabs (Cyclobenzaprine Hcl) .... Take 1 Tablet By Mouth Two Times A Day As Needed Back  Spasms 11)  Anusol-Hc 2.5 % Crea (Hydrocortisone) .... Apply To Rectum As Needed For Hemmorhoidal Discomfort 12)  Humulin 70/30 70-30 % Susp (Insulin Isophane & Regular) .... Inject Subcutaneously 15 Units With Breakfast and 10 Units With Dinner and Increase  As Directed 13)  Bd Insulin Syringe Microfine 28g X 1/2" 0.5 Ml Misc (Insulin Syringe-Needle U-100) .... Use As Directed With Insulin Two Times A Day 14)  Prilosec Otc 20 Mg Tbec (Omeprazole Magnesium) .... 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   Head:  normocephalic and atraumatic.   Eyes:  pupils equal and pupils round.   Neck:  No deformities, masses, or tenderness noted. Lungs:  Normal respiratory effort, chest expands symmetrically. Lungs are clear to auscultation, no crackles or wheezes. Heart:  normal rate and regular rhythm.   Abdomen:  Bowel sounds positive,abdomen soft and non-tender  without masses, organomegaly or hernias noted.  obese Extremities:  trace left pedal edema and trace right pedal edema.   Neurologic:  cranial nerves II-XII intact.  walks with a cane   Impression & Recommendations:  Problem # 1:  OBESITY (ICD-278.00) this is clearly her main problem advised aggressive weight loss  Problem # 2:  OTHER CHRONIC PAIN (ICD-338.29) especially back pain ok to use as needed  hydrocodone  Problem # 3:  DIABETES MELLITUS, TYPE II (ICD-250.00) needs labs  Her updated medication list for this problem includes:    Micardis 80 Mg Tabs (Telmisartan) ..... One by mouth daily    Glipizide 10 Mg Tabs (Glipizide) .Marland Kitchen... Take 1 tablet by mouth once a day    Humulin 70/30 70-30 % Susp (Insulin isophane & regular) ..... Inject subcutaneously 15 units with breakfast and 10 units with dinner and increase  as directed  Labs Reviewed: Creat: 0.9 (04/13/2009)     Last Eye Exam: normal-pt's report (01/29/2009) Reviewed HgBA1c results: 7.8 (04/13/2009)  10.2 (01/03/2009)  Problem # 4:  HYPERLIPIDEMIA (ICD-272.4) she has a dylipidemia---note low cholesterol no need for statin Labs Reviewed: SGOT: 27 (04/13/2009)   SGPT: 27 (04/13/2009)  Prior 10 Yr Risk Heart Disease: Not enough information (11/01/2005)   HDL:37.30 (04/13/2009), 32.20 (01/03/2009)  LDL:30 (04/13/2009), 42 (01/03/2009)  Chol:82 (04/13/2009), 91 (01/03/2009)  Trig:74.0 (04/13/2009), 84.0 (01/03/2009)  Problem # 5:  DEPRESSION (ICD-311) doing reasonably well and not on any medications.   Complete Medication List: 1)  Micardis 80 Mg Tabs (Telmisartan) .... One by mouth daily 2)  Glipizide 10 Mg Tabs (Glipizide) .... Take 1 tablet by mouth once a day 3)  Potassium Chloride Crys Cr 20 Meq Cr-tabs (Potassium chloride crys cr) .... Take 1 tablet by mouth two times a day 4)  Furosemide 40 Mg Tabs (Furosemide) .... Take 1 tablet by mouth twice a day 5)  Vicodin 5-500 Mg Tabs (Hydrocodone-acetaminophen) .... Take 1 tablet by mouth two times a day as needed 6)  Freestyle Test Strp (Glucose blood) .... Use qid or as directed 7)  Freestyle Lancets Misc (Lancets) .... Use as directed 8)  Colace 100 Mg Caps (Docusate sodium) .... Once daily 9)  Metronidazole 0.75 % Gel (Metronidazole) .... Apply at bedtime 10)  Flexeril 10 Mg Tabs (Cyclobenzaprine hcl) .... Take 1 tablet by mouth two times a day as needed back  spasms 11)  Anusol-hc 2.5 % Crea (Hydrocortisone) .... Apply to rectum as needed for hemmorhoidal discomfort 12)  Humulin 70/30 70-30 % Susp (Insulin isophane & regular) .... Inject subcutaneously 15 units with breakfast and 10 units with dinner and increase  as directed 13)  Bd Insulin Syringe Microfine 28g X 1/2" 0.5 Ml Misc (Insulin syringe-needle u-100) .... Use as directed with insulin two times a day 14)  Prilosec Otc 20 Mg Tbec (Omeprazole magnesium) .... Once daily  Other Orders: Radiology Referral (Radiology)  Patient Instructions: 1)  make appt for July--labs at that time Prescriptions: POTASSIUM CHLORIDE CRYS CR 20 MEQ CR-TABS (POTASSIUM CHLORIDE CRYS CR) Take 1 tablet by mouth two times a day  #60 x 6   Entered by:   Gladis Riffle, RN   Authorized by:   Birdie Sons MD   Signed by:   Gladis Riffle, RN on 07/15/2009   Method used:   Electronically to        [facility]* (retail)       [address]       [place], [place]  [id number]       Ph: [phone]       Fax: [phone]   RxID:   [id number]

## 2010-02-28 NOTE — Progress Notes (Signed)
Summary: Precertification Required  Phone Note From Other Clinic Call back at [phone]   Caller: Receptionist - Alvino Chapel @ [facility] Call For: Terri Summary of Call: Received order for virtual colonoscopy, however pre cert is required through [employer], CPT code is [phone] Initial call taken by: Trixie Dredge,  July 26, 2009 4:09 PM  Follow-up for Phone Call        No pre-cert required ... per Dara H. @ [employer].  Faxed info to [facility]. Follow-up by: Corky Mull,  July 27, 2009 8:33 AM

## 2010-02-28 NOTE — Progress Notes (Signed)
Summary: FYI  Phone Note Call from Patient   Caller: Patient live phone Call For: Birdie Sons MD Summary of Call: Insurance has changed what they will cover and colonoscopy now has to be paid up front by pt.  She has cancelled for time being and wanted you to know. Wants to be sure this was routine order and not emergent.  Is in [place] at present.  Also last tetanus was May 2006. Initial call taken by: Gladis Riffle, RN,  August 03, 2009 3:54 PM      Immunization History:  Tetanus/Td Immunization History:    Tetanus/Td:  historical (06/01/2004)

## 2010-02-28 NOTE — Assessment & Plan Note (Signed)
Summary: fu on dm/njr   Vital Signs:  Patient profile:   65 year old female Temp:     99 degrees F Pulse rhythm:   regular Resp:     16 per minute BP sitting:   128 / 80  (left arm) Cuff size:   large  Vitals Entered By: Kern Reap CMA Duncan Dull) (April 20, 2009 12:12 PM)  CC: follow-up visit Is Patient Diabetic? Yes Did you bring your meter with you today? No   CC:  follow-up visit.  History of Present Illness:  Follow-Up Visit      This is a 65 year old woman who presents for Follow-up visit.  The patient denies chest pain and palpitations.  Since the last visit the patient notes no new problems or concerns except noted some dizziness and nausea with cymbalta (increased dose). She reports having a SEIZURE on the cymbalta---actually whateer happened occured 2 days after stopping cymbalta (she reports having bit her tongue at night, also urinary incontinence---unwitnessed). She was able to do all of her normal activities that same day.   The patient reports taking meds as prescribed.  When questioned about possible medication side effects, the patient notes none.    insurance will not pay for protonix (changed to omeprazole)  All other systems reviewed and were negative   Current Problems (verified): 1)  Gerd  (ICD-530.81) 2)  Hyperlipidemia  (ICD-272.4) 3)  Cva  (ICD-434.91) 4)  Obesity  (ICD-278.00) 5)  Postherpetic Neuralgia  (ICD-053.19) 6)  Renal Failure  (ICD-586) 7)  Hypertension  (ICD-401.9) 8)  Diabetes Mellitus, Type II  (ICD-250.00) 9)  Depression  (ICD-311)  Current Medications (verified): 1)  Micardis 80 Mg  Tabs (Telmisartan) .... One By Mouth Daily 2)  Glipizide 10 Mg Tabs (Glipizide) .... Take 1 Tablet By Mouth Once A Day 3)  Potassium Chloride Crys Cr 20 Meq Cr-Tabs (Potassium Chloride Crys Cr) .... Take 1 Tablet By Mouth Two Times A Day 4)  Furosemide 40 Mg Tabs (Furosemide) .... Take 1 Tablet By Mouth Twice A Day 5)  Vicodin 5-500 Mg Tabs  (Hydrocodone-Acetaminophen) .... Take 1 Tablet By Mouth Twice A Day As Needed 6)  Freestyle Test   Strp (Glucose Blood) .... Use Qid or As Directed 7)  Freestyle Lancets   Misc (Lancets) .... Use As Directed 8)  Byetta 5 Mcg Pen 5 Mcg/0.45ml  Soln (Exenatide) .... Inject 5 Mcg Subcutaneously Two Times A Day 9)  Colace 100 Mg Caps (Docusate Sodium) .... Once Daily 10)  Metronidazole 0.75 % Gel (Metronidazole) .... Apply At Bedtime 11)  Flexeril 10 Mg Tabs (Cyclobenzaprine Hcl) .... Take 1 Tablet By Mouth Two Times A Day As Needed Back Spasms 12)  Anusol-Hc 2.5 % Crea (Hydrocortisone) .... Apply To Rectum As Needed For Hemmorhoidal Discomfort 13)  Pen Needles 31g X 8 Mm Misc (Insulin Pen Needle) .... Use As Directed 14)  Humulin 70/30 70-30 % Susp (Insulin Isophane & Regular) .... Inject Subcutaneously 15 Units With Breakfast and 10 Units With Dinner and Increase  As Directed 15)  Bd Insulin Syringe Microfine 28g X 1/2" 0.5 Ml Misc (Insulin Syringe-Needle U-100) .... Use As Directed With Insulin Two Times A Day  Allergies (verified): No Known Drug Allergies  Past History:  Past Medical History: Last updated: 04/12/2009 Depression Diabetes mellitus, type II Hypertension Renal failure post op osteo arthritis shingles 2008 Hyperlipidemia GERD  Past Surgical History: Last updated: 11/01/2005 knee arthroscopies (bilateral) gastric banding bladder sling Total knee replacement  Social  History: Last updated: 06/30/2007 Married Never Smoked Regular exercise-no a lot of stress related to bipolar daughter  Risk Factors: Exercise: no (05/06/2006)  Risk Factors: Smoking Status: never (03/01/2009)  Review of Systems       All other systems reviewed and were negative   Physical Exam  General:  alert and well-developed.   Head:  normocephalic and atraumatic.   Eyes:  pupils equal and pupils round.   Neck:  No deformities, masses, or tenderness noted. Chest Wall:  No  deformities, masses, or tenderness noted. Lungs:  Normal respiratory effort, chest expands symmetrically. Lungs are clear to auscultation, no crackles or wheezes. Heart:  Normal rate and regular rhythm. S1 and S2 normal without gallop, murmur, click, rub or other extra sounds. Abdomen:  Bowel sounds positive,abdomen soft and non-tender without masses, organomegaly or hernias noted.  obese Msk:  normal ROM and no joint tenderness.   Neurologic:  cranial nerves II-XII intact and gait normal.     Impression & Recommendations:  Problem # 1:  OTHER CHRONIC PAIN (ICD-338.29) unclear syndrome---? related to postherpetic neuralgia trial cymbalta---self dc cymbalta 60---  Problem # 2:  OBESITY (ICD-278.00) this is clearly her main problem have encouraged her to stop over eating she needs to eat 1200 calories daily and exercise every day at least for one hour.   Problem # 3:  POSTHERPETIC NEURALGIA (ICD-053.19) ? related to chronic pain  Problem # 4:  HYPERTENSION (ICD-401.9) reasonble control Her updated medication list for this problem includes:    Micardis 80 Mg Tabs (Telmisartan) ..... One by mouth daily    Furosemide 40 Mg Tabs (Furosemide) .Marland Kitchen... Take 1 tablet by mouth twice a day  BP today: 128/80 Prior BP: 132/72 (03/01/2009)  Prior 10 Yr Risk Heart Disease: Not enough information (11/01/2005)  Labs Reviewed: K+: 4.1 (04/13/2009) Creat: : 0.9 (04/13/2009)   Chol: 82 (04/13/2009)   HDL: 37.30 (04/13/2009)   LDL: 30 (04/13/2009)   TG: 74.0 (04/13/2009)  Problem # 5:  DIABETES MELLITUS, TYPE II (ICD-250.00) not controlled but much improved ---clearly related to obesity.  diet exercise and weight loss Her updated medication list for this problem includes:    Micardis 80 Mg Tabs (Telmisartan) ..... One by mouth daily    Glipizide 10 Mg Tabs (Glipizide) .Marland Kitchen... Take 1 tablet by mouth once a day    Byetta 5 Mcg Pen 5 Mcg/0.29ml Soln (Exenatide) ..... Inject 5 mcg subcutaneously two  times a day    Humulin 70/30 70-30 % Susp (Insulin isophane & regular) ..... Inject subcutaneously 15 units with breakfast and 10 units with dinner and increase  as directed  Labs Reviewed: Creat: 0.9 (04/13/2009)     Last Eye Exam: normal-pt's report (01/29/2009) Reviewed HgBA1c results: 7.8 (04/13/2009)  10.2 (01/03/2009)  Complete Medication List: 1)  Micardis 80 Mg Tabs (Telmisartan) .... One by mouth daily 2)  Glipizide 10 Mg Tabs (Glipizide) .... Take 1 tablet by mouth once a day 3)  Potassium Chloride Crys Cr 20 Meq Cr-tabs (Potassium chloride crys cr) .... Take 1 tablet by mouth two times a day 4)  Furosemide 40 Mg Tabs (Furosemide) .... Take 1 tablet by mouth twice a day 5)  Vicodin 5-500 Mg Tabs (Hydrocodone-acetaminophen) .... Take 1 tablet by mouth twice a day as needed 6)  Freestyle Test Strp (Glucose blood) .... Use qid or as directed 7)  Freestyle Lancets Misc (Lancets) .... Use as directed 8)  Byetta 5 Mcg Pen 5 Mcg/0.84ml Soln (Exenatide) .... Inject 5  mcg subcutaneously two times a day 9)  Colace 100 Mg Caps (Docusate sodium) .... Once daily 10)  Metronidazole 0.75 % Gel (Metronidazole) .... Apply at bedtime 11)  Flexeril 10 Mg Tabs (Cyclobenzaprine hcl) .... Take 1 tablet by mouth two times a day as needed back spasms 12)  Anusol-hc 2.5 % Crea (Hydrocortisone) .... Apply to rectum as needed for hemmorhoidal discomfort 13)  Pen Needles 31g X 8 Mm Misc (Insulin pen needle) .... Use as directed 14)  Humulin 70/30 70-30 % Susp (Insulin isophane & regular) .... Inject subcutaneously 15 units with breakfast and 10 units with dinner and increase  as directed 15)  Bd Insulin Syringe Microfine 28g X 1/2" 0.5 Ml Misc (Insulin syringe-needle u-100) .... Use as directed with insulin two times a day 16)  Omeprazole 20 Mg Cpdr (Omeprazole) .... One by mouth daily  Patient Instructions: 1)     2)  Please schedule a follow-up appointment in 1 month. Prescriptions: OMEPRAZOLE 20 MG  CPDR (OMEPRAZOLE) one by mouth daily  #90 x 3   Entered and Authorized by:   Birdie Sons MD   Signed by:   Birdie Sons MD on 04/20/2009   Method used:   Electronically to        [facility]* (retail)       [address]       [place], [place]  [id number]       Ph: [phone]       Fax: [phone]   RxID:   [id number]

## 2010-02-28 NOTE — Assessment & Plan Note (Signed)
Summary: 1 MONTH ROV/NJR/pt rsc/cjr/pt rsc/cjr   Vital Signs:  Patient profile:   65 year old female Height:      64.5 inches Weight:      270 pounds BMI:     45.79 Temp:     98.4 degrees F Pulse rate:   76 / minute Resp:     12 per minute BP sitting:   132 / 72  (left arm) Cuff size:   large  Vitals Entered By: Gladis Riffle, RN (March 01, 2009 11:21 AM)  Procedure Note  Skin Tag Removal: Onset of lesion: years Indication: enlarging, irritating  Procedure # 1: skin tag(s) removed    Location: neck-anterior    # lesions removed: 6    Instrument used: scissors    Anesthesia: 1% lidocaine w/epinephrine    Comment: verbal consent after risks discussed  CC: 1 month rov; CBGs 90-145 at home since on insulin Is Patient Diabetic? Yes Did you bring your meter with you today? No   CC:  1 month rov; CBGs 90-145 at home since on insulin.  History of Present Illness:  Follow-Up Visit      This is a 65 year old woman who presents for Follow-up visit.  The patient denies chest pain and palpitations.  Since the last visit the patient notes no new problems or concerns.  The patient reports taking meds as prescribed.  When questioned about possible medication side effects, the patient notes none.  she is absolutely noncompliant with exercise/diet she is taking insulin: tolerating meds  All other systems reviewed and were negative   Preventive Screening-Counseling & Management  Alcohol-Tobacco     Smoking Status: never     Smoking Cessation Counseling: no  Current Problems (verified): 1)  Hyperlipidemia  (ICD-272.4) 2)  Cva  (ICD-434.91) 3)  Obesity  (ICD-278.00) 4)  Postherpetic Neuralgia  (ICD-053.19) 5)  Renal Failure  (ICD-586) 6)  Hypertension  (ICD-401.9) 7)  Diabetes Mellitus, Type II  (ICD-250.00) 8)  Depression  (ICD-311)  Medications Prior to Update: 1)  Protonix 40 Mg Tbec (Pantoprazole Sodium) .Marland Kitchen.. 1 By Mouth Once Daily 2)  Micardis 80 Mg  Tabs (Telmisartan)  .... One By Mouth Daily 3)  Glipizide 10 Mg Tabs (Glipizide) .... One By Mouth Bid 4)  Potassium Chloride Crys Cr 20 Meq Cr-Tabs (Potassium Chloride Crys Cr) .... Take 1 Tablet By Mouth Two Times A Day 5)  Furosemide 40 Mg Tabs (Furosemide) .... Take 1 Tablet By Mouth Twice A Day 6)  Vicodin 5-500 Mg Tabs (Hydrocodone-Acetaminophen) .... Take 1 Tablet By Mouth Twice A Day As Needed 7)  Freestyle Test   Strp (Glucose Blood) .... Use Qid or As Directed 8)  Freestyle Lancets   Misc (Lancets) .... Use As Directed 9)  Byetta 5 Mcg Pen 5 Mcg/0.39ml  Soln (Exenatide) .... Inject 5 Mcg Subcutaneously Two Times A Day 10)  Colace 100 Mg Caps (Docusate Sodium) .... Once Daily 11)  Metronidazole 0.75 % Gel (Metronidazole) .... Apply At Bedtime 12)  Flexeril 10 Mg Tabs (Cyclobenzaprine Hcl) .... Take 1 Tablet By Mouth Two Times A Day As Needed Back Spasms 13)  Anusol-Hc 2.5 % Crea (Hydrocortisone) .... Apply To Rectum As Needed For Hemmorhoidal Discomfort 14)  Pen Needles 31g X 8 Mm Misc (Insulin Pen Needle) .... Use As Directed 15)  Humulin 70/30 70-30 % Susp (Insulin Isophane & Regular) .... Inject Subcutaneously 15 Units With Breakfast and 10 Units With Dinner and Increase  As Directed 16)  Bd Insulin Syringe Microfine 28g X 1/2" 0.5 Ml Misc (Insulin Syringe-Needle U-100) .... Use As Directed With Insulin Two Times A Day 17)  Cymbalta 30 Mg Cpep (Duloxetine Hcl) .... Take 1 Tablet By Mouth Once A Day  Current Medications (verified): 1)  Protonix 40 Mg Tbec (Pantoprazole Sodium) .Marland Kitchen.. 1 By Mouth Once Daily 2)  Micardis 80 Mg  Tabs (Telmisartan) .... One By Mouth Daily 3)  Glipizide 10 Mg Tabs (Glipizide) .... One By Mouth Bid 4)  Potassium Chloride Crys Cr 20 Meq Cr-Tabs (Potassium Chloride Crys Cr) .... Take 1 Tablet By Mouth Two Times A Day 5)  Furosemide 40 Mg Tabs (Furosemide) .... Take 1 Tablet By Mouth Twice A Day 6)  Vicodin 5-500 Mg Tabs (Hydrocodone-Acetaminophen) .... Take 1 Tablet By Mouth  Twice A Day As Needed 7)  Freestyle Test   Strp (Glucose Blood) .... Use Qid or As Directed 8)  Freestyle Lancets   Misc (Lancets) .... Use As Directed 9)  Byetta 5 Mcg Pen 5 Mcg/0.75ml  Soln (Exenatide) .... Inject 5 Mcg Subcutaneously Two Times A Day 10)  Colace 100 Mg Caps (Docusate Sodium) .... Once Daily 11)  Metronidazole 0.75 % Gel (Metronidazole) .... Apply At Bedtime 12)  Flexeril 10 Mg Tabs (Cyclobenzaprine Hcl) .... Take 1 Tablet By Mouth Two Times A Day As Needed Back Spasms 13)  Anusol-Hc 2.5 % Crea (Hydrocortisone) .... Apply To Rectum As Needed For Hemmorhoidal Discomfort 14)  Pen Needles 31g X 8 Mm Misc (Insulin Pen Needle) .... Use As Directed 15)  Humulin 70/30 70-30 % Susp (Insulin Isophane & Regular) .... Inject Subcutaneously 15 Units With Breakfast and 10 Units With Dinner and Increase  As Directed 16)  Bd Insulin Syringe Microfine 28g X 1/2" 0.5 Ml Misc (Insulin Syringe-Needle U-100) .... Use As Directed With Insulin Two Times A Day 17)  Cymbalta 30 Mg Cpep (Duloxetine Hcl) .... Take 1 Tablet By Mouth Once A Day  Allergies (verified): No Known Drug Allergies  Comments:  Nurse/Medical Assistant: 1 month rov, CBGs 90-145 at home--c/o shingles return right upper arm  The patient's medications and allergies were reviewed with the patient and were updated in the Medication and Allergy Lists. Gladis Riffle, RN (March 01, 2009 11:25 AM)  Past History:  Past Medical History: Last updated: 02/10/2008 Depression Diabetes mellitus, type II Hypertension Renal failure post op osteo arthritis shingles 2008 Hyperlipidemia  Past Surgical History: Last updated: 11/01/2005 knee arthroscopies (bilateral) gastric banding bladder sling Total knee replacement  Social History: Last updated: 06/30/2007 Married Never Smoked Regular exercise-no a lot of stress related to bipolar daughter  Risk Factors: Exercise: no (05/06/2006)  Risk Factors: Smoking Status: never  (03/01/2009)  Physical Exam  General:  Well-developed,well-nourished,in no acute distress; alert,appropriate and cooperative throughout examinationalert and well-developed.   Head:  normocephalic and atraumatic.   Eyes:  pupils equal and pupils round.   Ears:  R ear normal and L ear normal.   Nose:  no external deformity and no external erythema.   Neck:  No deformities, masses, or tenderness noted. Chest Wall:  No deformities, masses, or tenderness noted. Lungs:  Normal respiratory effort, chest expands symmetrically. Lungs are clear to auscultation, no crackles or wheezes. Heart:  Normal rate and regular rhythm. S1 and S2 normal without gallop, murmur, click, rub or other extra sounds. Abdomen:  Bowel sounds positive,abdomen soft and non-tender without masses, organomegaly or hernias noted.  obese Msk:  normal ROM and no joint tenderness.   Pulses:  R radial normal and L radial normal.   Neurologic:  cranial nerves II-XII intact and gait normal.   strength in all extremities normal using cane for ambulations Skin:  turgor normal and color normal.   Psych:  good eye contact and not anxious appearing.    Diabetes Management Exam:    Eye Exam:       Eye Exam done elsewhere          Date: 01/29/2009          Results: normal-pt's report          Done by: Manning Charity   Impression & Recommendations:  Problem # 1:  POSTHERPETIC NEURALGIA (ICD-053.19) on cymbalta---increse to 60 mg side effects discussed  Problem # 2:  CVA (ICD-434.91) no recurrence need to address risk factors: obesity, DM, lipids, htn  daily exercise  Problem # 3:  HYPERTENSION (ICD-401.9) reasonable control given weight Her updated medication list for this problem includes:    Micardis 80 Mg Tabs (Telmisartan) ..... One by mouth daily    Furosemide 40 Mg Tabs (Furosemide) .Marland Kitchen... Take 1 tablet by mouth twice a day  BP today: 132/72 Prior BP: 124/70 (01/03/2009)  Prior 10 Yr Risk Heart Disease: Not  enough information (11/01/2005)  Labs Reviewed: K+: 4.4 (01/03/2009) Creat: : 1.0 (01/03/2009)   Chol: 91 (01/03/2009)   HDL: 32.20 (01/03/2009)   LDL: 42 (01/03/2009)   TG: 84.0 (01/03/2009)  Problem # 4:  HYPERLIPIDEMIA (ICD-272.4)  Labs Reviewed: SGOT: 50 (01/03/2009)   SGPT: 46 (01/03/2009)  Prior 10 Yr Risk Heart Disease: Not enough information (11/01/2005)   HDL:32.20 (01/03/2009), 30.40 (09/15/2008)  LDL:42 (01/03/2009), 33 (09/15/2008)  Chol:91 (01/03/2009), 78 (09/15/2008)  Trig:84.0 (01/03/2009), 75.0 (09/15/2008)  Problem # 5:  DEPRESSION (ICD-311) reasonable control continue current medications  Her updated medication list for this problem includes:    Cymbalta 60 Mg Cpep (Duloxetine hcl) .Marland Kitchen... Take one capsule daily  Complete Medication List: 1)  Protonix 40 Mg Tbec (Pantoprazole sodium) .Marland Kitchen.. 1 by mouth once daily 2)  Micardis 80 Mg Tabs (Telmisartan) .... One by mouth daily 3)  Glipizide 10 Mg Tabs (Glipizide) .... Take 1 tablet by mouth once a day 4)  Potassium Chloride Crys Cr 20 Meq Cr-tabs (Potassium chloride crys cr) .... Take 1 tablet by mouth two times a day 5)  Furosemide 40 Mg Tabs (Furosemide) .... Take 1 tablet by mouth twice a day 6)  Vicodin 5-500 Mg Tabs (Hydrocodone-acetaminophen) .... Take 1 tablet by mouth twice a day as needed 7)  Freestyle Test Strp (Glucose blood) .... Use qid or as directed 8)  Freestyle Lancets Misc (Lancets) .... Use as directed 9)  Byetta 5 Mcg Pen 5 Mcg/0.56ml Soln (Exenatide) .... Inject 5 mcg subcutaneously two times a day 10)  Colace 100 Mg Caps (Docusate sodium) .... Once daily 11)  Metronidazole 0.75 % Gel (Metronidazole) .... Apply at bedtime 12)  Flexeril 10 Mg Tabs (Cyclobenzaprine hcl) .... Take 1 tablet by mouth two times a day as needed back spasms 13)  Anusol-hc 2.5 % Crea (Hydrocortisone) .... Apply to rectum as needed for hemmorhoidal discomfort 14)  Pen Needles 31g X 8 Mm Misc (Insulin pen needle) .... Use as  directed 15)  Humulin 70/30 70-30 % Susp (Insulin isophane & regular) .... Inject subcutaneously 15 units with breakfast and 10 units with dinner and increase  as directed 16)  Bd Insulin Syringe Microfine 28g X 1/2" 0.5 Ml Misc (Insulin syringe-needle u-100) .... Use as directed with  insulin two times a day 17)  Cymbalta 60 Mg Cpep (Duloxetine hcl) .... Take one capsule daily  Other Orders: Removal of Skin Tags up to 15 Lesions (11200)  Patient Instructions: 1)  Please schedule a follow-up appointment in 2 months. 2)  labs one week prior to visit 3)  lipids---272.4 4)  lfts-995.2 5)  bmet-995.2 6)  A1C-250.02 7)     Prescriptions: CYMBALTA 60 MG CPEP (DULOXETINE HCL) take one capsule daily  #90 x 3   Entered and Authorized by:   Birdie Sons MD   Signed by:   Birdie Sons MD on 03/01/2009   Method used:   Electronically to        [facility]* (retail)       [address]       [place], [place]  [id number]       Ph: [phone]       Fax: [phone]   RxID:   [id number]

## 2010-02-28 NOTE — Assessment & Plan Note (Signed)
Summary: 3 MTH ROV // RS   Vital Signs:  Patient profile:   65 year old female BP sitting:   130 / 80  (left arm) Cuff size:   large  Vitals Entered By: Kern Reap CMA Duncan Dull) (October 11, 2009 11:31 AM) CC: follow-up visit Is Patient Diabetic? Yes Did you bring your meter with you today? No Pain Assessment Patient in pain? yes         CC:  follow-up visit.  History of Present Illness:  Follow-Up Visit      This is a 66 year old woman who presents for Follow-up visit.  The patient denies chest pain and palpitations.  Since the last visit the patient notes no new problems or concerns.  The patient reports not taking meds as prescribed, not monitoring BP, and monitoring blood sugars.  When questioned about possible medication side effects, the patient notes none.  she is self adjusting insulin because of a.m. sugars being > 200  All other systems reviewed and were negative except she notes some dysphagia---dificulty swallowing at times says she had shingles last week----vesicles and some nerve pain---much better also complains of generalized muscle weakness--"i just feel weak" . no specific complaints.   All other systems reviewed and were negative   Current Problems (verified): 1)  Preventive Health Care  (ICD-V70.0) 2)  Edema  (ICD-782.3) 3)  Other Chronic Pain  (ICD-338.29) 4)  Gerd  (ICD-530.81) 5)  Hyperlipidemia  (ICD-272.4) 6)  Cva  (ICD-434.91) 7)  Obesity  (ICD-278.00) 8)  Postherpetic Neuralgia  (ICD-053.19) 9)  Renal Failure  (ICD-586) 10)  Hypertension  (ICD-401.9) 11)  Diabetes Mellitus, Type II  (ICD-250.00) 12)  Depression  (ICD-311)  Current Medications (verified): 1)  Micardis 80 Mg  Tabs (Telmisartan) .... One By Mouth Daily 2)  Glipizide 10 Mg Tabs (Glipizide) .... Take 1 Tablet By Mouth Once A Day 3)  Potassium Chloride Crys Cr 20 Meq Cr-Tabs (Potassium Chloride Crys Cr) .... Take 1 Tablet By Mouth Two Times A Day 4)  Furosemide 40 Mg Tabs  (Furosemide) .... Take 1 Tablet By Mouth Twice A Day 5)  Vicodin 5-500 Mg Tabs (Hydrocodone-Acetaminophen) .... Take 1 Tablet By Mouth Two Times A Day As Needed 6)  Freestyle Test   Strp (Glucose Blood) .... Use Qid or As Directed 7)  Freestyle Lancets   Misc (Lancets) .... Use As Directed 8)  Colace 100 Mg Caps (Docusate Sodium) .... Once Daily 9)  Metronidazole 0.75 % Gel (Metronidazole) .... Apply At Bedtime 10)  Flexeril 10 Mg Tabs (Cyclobenzaprine Hcl) .... Take 1 Tablet By Mouth Two Times A Day As Needed Back Spasms 11)  Anusol-Hc 2.5 % Crea (Hydrocortisone) .... Apply To Rectum As Needed For Hemmorhoidal Discomfort 12)  Humulin 70/30 70-30 % Susp (Insulin Isophane & Regular) .... Inject Subcutaneously 15 Units With Breakfast and 10 Units With Dinner and Increase  As Directed 13)  Bd Insulin Syringe Microfine 28g X 1/2" 0.5 Ml Misc (Insulin Syringe-Needle U-100) .... Use As Directed With Insulin Two Times A Day 14)  Prilosec Otc 20 Mg Tbec (Omeprazole Magnesium) .... 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 no    Any prior history of allergic reactions to egg and/or gelatin? no    Do you have a sensitivity to the preservative Thimersol? no    Do you have a past history of Guillan-Barre  Syndrome? no    Do you currently have an acute febrile illness? no    Have you ever had a severe reaction to latex? no    Vaccine information given and explained to patient? yes    Are you currently pregnant? no    Lot Number:[id number]   Exp Date:07/29/2010   Site Given  Right  Deltoid IM   Physical Exam  General:  alert and well-developed.   Head:  normocephalic and atraumatic.   Eyes:  pupils equal and pupils round.   Ears:  R ear normal and L ear normal.   Neck:  No deformities, masses, or tenderness noted. Chest Wall:  No deformities, masses, or tenderness noted. Lungs:  normal respiratory effort and no intercostal retractions.   Heart:  normal rate and regular rhythm.   Abdomen:  soft and non-tender.  overweight Neurologic:  she has give-away weakness in all muscle groups uper and lower extremities  DTRs at knees are normal---absent ankles DTRs at biceps and brachioradialis are normal Skin:  turgor normal and color normal.   Cervical Nodes:  no anterior cervical adenopathy and no posterior cervical adenopathy.   Psych:  normally interactive and good eye contact.     Impression & Recommendations:  Problem # 1:  OTHER CHRONIC PAIN (ICD-338.29) doing reasonably well  Problem # 2:  DIABETES MELLITUS, TYPE II (ICD-250.00) This is clearly related to weight she needs desperate weight loss  Her updated medication list for this problem includes:    Micardis 80 Mg Tabs (Telmisartan) ..... One by mouth daily    Glipizide 10 Mg Tabs (Glipizide) .Marland Kitchen... Take 1 tablet by mouth once a day    Humulin 70/30 70-30 % Susp (Insulin isophane & regular) ..... Inject subcutaneously 15 units with breakfast and 10 units with dinner and increase  as directed  Labs Reviewed: Creat: 0.9 (04/13/2009)     Last Eye Exam: normal-pt's report (01/29/2009) Reviewed HgBA1c results: 7.8 (04/13/2009)  10.2 (01/03/2009)  Orders: Specimen Handling (14782) TLB-Lipid Panel (80061-LIPID) TLB-BMP (Basic  Metabolic Panel-BMET) (80048-METABOL) TLB-Hepatic/Liver Function Pnl (80076-HEPATIC) TLB-A1C / Hgb A1C (Glycohemoglobin) (83036-A1C)  Problem # 3:  MUSCLE WEAKNESS (GENERALIZED) (ICD-728.87) suspcet related to deconditioning advised daily exercise Orders: Venipuncture (95621) T-Aldolase (30865-78469) Specimen Handling (62952) TLB-CK Total Only(Creatine Kinase/CPK) (82550-CK) TLB-Sedimentation Rate (ESR) (85652-ESR)  Complete Medication List: 1)  Micardis 80 Mg Tabs (Telmisartan) .... One by mouth daily 2)  Glipizide 10 Mg Tabs (Glipizide) .... Take 1 tablet by mouth once a day 3)  Potassium Chloride Crys Cr 20 Meq Cr-tabs (Potassium chloride crys cr) .... Take 1 tablet by mouth two times a day 4)  Furosemide 40 Mg Tabs (Furosemide) .... Take 1 tablet by mouth twice a day 5)  Vicodin 5-500 Mg Tabs (Hydrocodone-acetaminophen) .... Take 1 tablet by mouth two times a day as needed 6)  Freestyle Test Strp (Glucose blood) .... Use qid or as directed 7)  Freestyle Lancets Misc (Lancets) .... Use as directed 8)  Colace 100 Mg  Caps (Docusate sodium) .... Once daily 9)  Metronidazole 0.75 % Gel (Metronidazole) .... Apply at bedtime 10)  Flexeril 10 Mg Tabs (Cyclobenzaprine hcl) .... Take 1 tablet by mouth two times a day as needed back spasms 11)  Anusol-hc 2.5 % Crea (Hydrocortisone) .... Apply to rectum as needed for hemmorhoidal discomfort 12)  Humulin 70/30 70-30 % Susp (Insulin isophane & regular) .... Inject subcutaneously 15 units with breakfast and 10 units with dinner and increase  as directed 13)  Bd Insulin Syringe Microfine 28g X 1/2" 0.5 Ml Misc (Insulin syringe-needle u-100) .... Use as directed with insulin two times a day 14)  Prilosec Otc 20 Mg Tbec (Omeprazole magnesium) .... Once daily  Other Orders: Admin 1st Vaccine (01027) Flu Vaccine 41yrs + [phone])

## 2010-02-28 NOTE — Assessment & Plan Note (Signed)
Summary: fu per pt/njr   Vital Signs:  Patient profile:   65 year old female Pulse rate:   80 / minute Pulse rhythm:   regular Resp:     14 per minute BP sitting:   122 / 78  (left arm) Cuff size:   large  Vitals Entered By: Gladis Riffle, RN (May 13, 2009 10:04 AM) CC: c/o edema--CBGs fasting 164 at highest at home Is Patient Diabetic? Yes Did you bring your meter with you today? No   CC:  c/o edema--CBGs fasting 164 at highest at home.  History of Present Illness: she is concerned with edema: she reports dependent edema sxs resolved in a.m., worse in the afternoon she eats minimal salt she is taking a lot of herbal supplements  All other systems reviewed and were negative   Preventive Screening-Counseling & Management  Alcohol-Tobacco     Smoking Status: never     Smoking Cessation Counseling: no  Current Problems (verified): 1)  Other Chronic Pain  (ICD-338.29) 2)  Gerd  (ICD-530.81) 3)  Hyperlipidemia  (ICD-272.4) 4)  Cva  (ICD-434.91) 5)  Obesity  (ICD-278.00) 6)  Postherpetic Neuralgia  (ICD-053.19) 7)  Renal Failure  (ICD-586) 8)  Hypertension  (ICD-401.9) 9)  Diabetes Mellitus, Type II  (ICD-250.00) 10)  Depression  (ICD-311)  Current Medications (verified): 1)  Micardis 80 Mg  Tabs (Telmisartan) .... One By Mouth Daily 2)  Glipizide 10 Mg Tabs (Glipizide) .... Take 1 Tablet By Mouth Once A Day 3)  Potassium Chloride Crys Cr 20 Meq Cr-Tabs (Potassium Chloride Crys Cr) .... Take 1 Tablet By Mouth Two Times A Day 4)  Furosemide 40 Mg Tabs (Furosemide) .... Take 1 Tablet By Mouth Twice A Day 5)  Vicodin 5-500 Mg Tabs (Hydrocodone-Acetaminophen) .... Take 1 Tablet By Mouth Twice A Day As Needed 6)  Freestyle Test   Strp (Glucose Blood) .... Use Qid or As Directed 7)  Freestyle Lancets   Misc (Lancets) .... Use As Directed 8)  Byetta 5 Mcg Pen 5 Mcg/0.27ml  Soln (Exenatide) .... Inject 5 Mcg Subcutaneously Two Times A Day 9)  Colace 100 Mg Caps (Docusate  Sodium) .... Once Daily 10)  Metronidazole 0.75 % Gel (Metronidazole) .... Apply At Bedtime 11)  Flexeril 10 Mg Tabs (Cyclobenzaprine Hcl) .... Take 1 Tablet By Mouth Two Times A Day As Needed Back Spasms 12)  Anusol-Hc 2.5 % Crea (Hydrocortisone) .... Apply To Rectum As Needed For Hemmorhoidal Discomfort 13)  Pen Needles 31g X 8 Mm Misc (Insulin Pen Needle) .... Use As Directed 14)  Humulin 70/30 70-30 % Susp (Insulin Isophane & Regular) .... Inject Subcutaneously 15 Units With Breakfast and 10 Units With Dinner and Increase  As Directed 15)  Bd Insulin Syringe Microfine 28g X 1/2" 0.5 Ml Misc (Insulin Syringe-Needle U-100) .... Use As Directed With Insulin Two Times A Day 16)  Omeprazole 20 Mg Cpdr (Omeprazole) .... 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   Pulses:  R radial  normal and L radial normal.   Extremities:  trace left pedal edema and trace right pedal edema.     Impression & Recommendations:  Problem # 1:  EDEMA (ICD-782.3) related to obesity most likely will try to stop byetta for 2 weeks. (will not remove from med list yet) Her updated medication list for this problem includes:    Furosemide 40 Mg Tabs (Furosemide) .Marland Kitchen... Take 1 tablet by mouth twice a day  Complete Medication List: 1)  Micardis 80 Mg Tabs (Telmisartan) .... One by mouth  daily 2)  Glipizide 10 Mg Tabs (Glipizide) .... Take 1 tablet by mouth once a day 3)  Potassium Chloride Crys Cr 20 Meq Cr-tabs (Potassium chloride crys cr) .... Take 1 tablet by mouth two times a day 4)  Furosemide 40 Mg Tabs (Furosemide) .... Take 1 tablet by mouth twice a day 5)  Vicodin 5-500 Mg Tabs (Hydrocodone-acetaminophen) .... Take 1 tablet by mouth twice a day as needed 6)  Freestyle Test Strp (Glucose blood) .... Use qid or as directed 7)  Freestyle Lancets Misc (Lancets) .... Use as directed 8)  Byetta 5 Mcg Pen 5 Mcg/0.64ml Soln (Exenatide) .... Inject 5 mcg subcutaneously two times a day 9)  Colace 100 Mg Caps (Docusate sodium) .... Once daily 10)  Metronidazole 0.75 % Gel (Metronidazole) .... Apply at bedtime 11)  Flexeril 10 Mg Tabs (Cyclobenzaprine hcl) .... Take 1 tablet by mouth two times a day as needed back spasms 12)  Anusol-hc 2.5 % Crea (Hydrocortisone) .... Apply to rectum as needed for hemmorhoidal discomfort 13)  Pen Needles 31g X 8 Mm Misc (Insulin pen needle) .... Use as directed 14)  Humulin 70/30 70-30 % Susp (Insulin isophane & regular) .... Inject subcutaneously 15 units with breakfast and 10 units with dinner and increase  as directed 15)  Bd Insulin Syringe Microfine 28g X 1/2" 0.5 Ml Misc (Insulin syringe-needle u-100) .... Use as directed with insulin two times a day 16)  Omeprazole 20 Mg Cpdr (Omeprazole) .... One by mouth daily  Patient Instructions: 1)  make appt for mole removal ONLY 2weeks

## 2010-03-01 ENCOUNTER — Other Ambulatory Visit: Payer: Self-pay | Admitting: *Deleted

## 2010-03-01 NOTE — Telephone Encounter (Signed)
She has had poorly controlled DM for years and has had renal failure previously. Stay on insulin

## 2010-03-01 NOTE — Telephone Encounter (Signed)
Pt given Dr. Cato Mulligan recommendations.

## 2010-03-01 NOTE — Telephone Encounter (Signed)
Pt call requesting Dr. Cato [name] to change her Insulin back to Metformin.  Does not like the Insulin, and feels she can control her BS with oral meds.

## 2010-03-02 NOTE — Assessment & Plan Note (Signed)
Summary: 3 MTH ROV // RS   Vital Signs:  Patient profile:   65 year old female Height:      64.5 inches Temp:     98.2 degrees F oral Pulse rate:   80 / minute BP sitting:   160 / 86  (left arm)  Vitals Entered By: Jeremy Johann CMA (January 02, 2010 11:22 AM) CC: 3 month f/u , not fasting Comments decline to weight   CC:  3 month f/u  and not fasting.  History of Present Illness:  Follow-Up Visit      This is a 65 year old woman who presents for Follow-up visit.  The patient denies chest pain and palpitations.  Since the last visit the patient notes no new problems or concerns.  The patient reports taking meds as prescribed, not monitoring BP, monitoring blood sugars, dietary noncompliance, and not exercising.  When questioned about possible medication side effects, the patient notes none.    CBGs can be as high as 250  Current Medications (verified): 1)  Micardis 80 Mg  Tabs (Telmisartan) .... One By Mouth Daily 2)  Glipizide 10 Mg Tabs (Glipizide) .... Take 1 Tablet By Mouth Once A Day 3)  Potassium Chloride Crys Cr 20 Meq Cr-Tabs (Potassium Chloride Crys Cr) .... Take 1 Tablet By Mouth Two Times A Day 4)  Furosemide 40 Mg Tabs (Furosemide) .... Take 1 Tablet By Mouth Twice A Day 5)  Vicodin 5-500 Mg Tabs (Hydrocodone-Acetaminophen) .... Take 1 Tablet By Mouth Two Times A Day As Needed 6)  Freestyle Test   Strp (Glucose Blood) .... Use Qid or As Directed 7)  Freestyle Lancets   Misc (Lancets) .... Use As Directed 8)  Colace 100 Mg Caps (Docusate Sodium) .... Once Daily 9)  Metronidazole 0.75 % Gel (Metronidazole) .... Apply At Bedtime 10)  Flexeril 10 Mg Tabs (Cyclobenzaprine Hcl) .... Take 1 Tablet By Mouth Two Times A Day As Needed Back Spasms 11)  Anusol-Hc 2.5 % Crea (Hydrocortisone) .... Apply To Rectum As Needed For Hemmorhoidal Discomfort 12)  Humulin 70/30 70-30 % Susp (Insulin Isophane & Regular) .... Inject Subcutaneously 15 Units With Breakfast and 10 Units With  Dinner and Increase  As Directed 13)  Bd Insulin Syringe Microfine 28g X 1/2" 0.5 Ml Misc (Insulin Syringe-Needle U-100) .... Use As Directed With Insulin Two Times A Day 14)  Prilosec Otc 20 Mg Tbec (Omeprazole Magnesium) .... 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 HEENT exam atraumatic, normocephalic. She is walking with a cane. 1-2+ edema of LE bilaterally   Impression & Recommendations:  Problem # 1:  OTHER CHRONIC PAIN (ICD-338.29) has had some back pain addressing with back exercises  Problem # 2:  GERD (ICD-530.81) controlled continue current medications  Her updated medication list for this problem includes:    Prilosec Otc 20 Mg Tbec (Omeprazole magnesium) ..... Once daily  Problem # 3:  DIABETES MELLITUS, TYPE II (ICD-250.00)  will check labs in  January Her updated medication list for this problem includes:    Micardis 80 Mg Tabs (Telmisartan) ..... One by mouth daily    Glipizide 10 Mg Tabs (Glipizide) .Marland Kitchen... Take 1 tablet by mouth once a day    Humulin 70/30 70-30 % Susp (Insulin isophane & regular) ..... Inject subcutaneously 15 units with breakfast and 10 units with dinner and increase  as directed  Labs Reviewed: Creat: 0.9 (10/11/2009)     Last Eye Exam: normal-pt's report (01/29/2009) Reviewed HgBA1c results: 9.6  (10/11/2009)  7.8 (04/13/2009)  Complete Medication List: 1)  Micardis 80 Mg Tabs (Telmisartan) .... One by mouth daily 2)  Glipizide 10 Mg Tabs (Glipizide) .... Take 1 tablet by mouth once a day 3)  Potassium Chloride Crys Cr 20 Meq Cr-tabs (Potassium chloride crys cr) .... Take 1 tablet by mouth two times a day 4)  Furosemide 40 Mg Tabs (Furosemide) .... Take 1 tablet by mouth twice a day 5)  Vicodin 5-500 Mg Tabs (Hydrocodone-acetaminophen) .... Take 1 tablet by mouth two times a day as needed 6)  Freestyle Test Strp (Glucose blood) .... Use qid or as directed 7)  Freestyle Lancets Misc (Lancets) .... Use as directed 8)  Colace 100 Mg Caps (Docusate sodium) .... Once daily 9)  Metronidazole 0.75 % Gel (Metronidazole) .... Apply at bedtime 10)  Flexeril 10 Mg Tabs (Cyclobenzaprine hcl) .... Take 1 tablet by mouth two times a day as needed back spasms 11)  Anusol-hc 2.5 % Crea (Hydrocortisone) .... Apply to rectum as needed for hemmorhoidal discomfort 12)  Humulin 70/30 70-30 % Susp (Insulin isophane & regular) .... Inject subcutaneously 15 units with breakfast and 10 units with dinner and increase  as directed 13)  Bd Insulin Syringe Microfine 28g X 1/2" 0.5 Ml Misc (Insulin syringe-needle u-100) .... Use as directed with insulin two times a day 14)  Prilosec Otc 20 Mg Tbec (Omeprazole magnesium) .... Once daily  Patient Instructions: 1)  Please schedule a follow-up appointment in 6 weeks  2)  labs one week prior to visit 3)  lipids---272.4 4)  lfts-995.2 5)  bmet-995.2 6)  A1C-250.02 7)       Orders Added: 1)  Est. Patient Level IV [04540]

## 2010-03-02 NOTE — Assessment & Plan Note (Signed)
Summary: painful umb. hernia/dm   Vital Signs:  Patient profile:   65 year old female Weight:      284 pounds BMI:     48.17 Temp:     98.0 degrees F oral BP sitting:   132 / 84  (left arm) Cuff size:   large  Vitals Entered By: Alfred Levins, CMA (January 17, 2010 10:13 AM) CC: hiatal hernia   CC:  hiatal hernia.  History of Present Illness: has a ventral hernia--thinks a bit more painful and concerned because she is leaving to [facility]. no nausea, vomiting. having normal BMs appetiete normal.   All other systems reviewed and were negative except for usual aches and pain  Current Problems (verified): 1)  Preventive Health Care  (ICD-V70.0) 2)  Edema  (ICD-782.3) 3)  Other Chronic Pain  (ICD-338.29) 4)  Gerd  (ICD-530.81) 5)  Hyperlipidemia  (ICD-272.4) 6)  Cva  (ICD-434.91) 7)  Obesity  (ICD-278.00) 8)  Renal Failure  (ICD-586) 9)  Hypertension  (ICD-401.9) 10)  Diabetes Mellitus, Type II  (ICD-250.00) 11)  Depression  (ICD-311)  Current Medications (verified): 1)  Micardis 80 Mg  Tabs (Telmisartan) .... One By Mouth Daily 2)  Glipizide 10 Mg Tabs (Glipizide) .... Take 1 Tablet By Mouth Once A Day 3)  Potassium Chloride Crys Cr 20 Meq Cr-Tabs (Potassium Chloride Crys Cr) .... Take 1 Tablet By Mouth Two Times A Day 4)  Furosemide 40 Mg Tabs (Furosemide) .... Take 1 Tablet By Mouth Twice A Day 5)  Vicodin 5-500 Mg Tabs (Hydrocodone-Acetaminophen) .... Take 1 Tablet By Mouth Two Times A Day As Needed 6)  Freestyle Test   Strp (Glucose Blood) .... Use Qid or As Directed 7)  Freestyle Lancets   Misc (Lancets) .... Use As Directed 8)  Colace 100 Mg Caps (Docusate Sodium) .... Once Daily 9)  Flexeril 10 Mg Tabs (Cyclobenzaprine Hcl) .... Take 1 Tablet By Mouth Two Times A Day As Needed Back Spasms 10)  Anusol-Hc 2.5 % Crea (Hydrocortisone) .... Apply To Rectum As Needed For Hemmorhoidal Discomfort 11)  Humulin 70/30 70-30 % Susp (Insulin Isophane & Regular) .... Inject  Subcutaneously 15 Units With Breakfast and 10 Units With Dinner and Increase  As Directed 12)  Bd Insulin Syringe Microfine 28g X 1/2" 0.5 Ml Misc (Insulin Syringe-Needle U-100) .... Use As Directed With Insulin Two Times A Day 13)  Prilosec Otc 20 Mg Tbec (Omeprazole Magnesium) .... Once Daily  Allergies (verified): 1)  Ibuprofen (Ibuprofen)  Physical Exam  General:  these female in no acute distress. HEENT exam atraumatic, normocephalic no icterus. Abdominal exam orbital obesity, active bowel sounds, soft. She does have a ventral hernia when she stands bruits easily reducible when she lies flat.   Impression & Recommendations:  Problem # 1:  VENTRAL HERNIA (ICD-553.20) large ventral hernia. It's reducible. I don't think she is at any significant risk of strangulation (above-average). She understands the risks of traveling with a ventral hernia. She will be with her daughter. I don't see that there is a significant contraindication to traveling. She will call me if any concerns.  Complete Medication List: 1)  Micardis 80 Mg Tabs (Telmisartan) .... One by mouth daily 2)  Glipizide 10 Mg Tabs (Glipizide) .... Take 1 tablet by mouth once a day 3)  Potassium Chloride Crys Cr 20 Meq Cr-tabs (Potassium chloride crys cr) .... Take 1 tablet by mouth two times a day 4)  Furosemide 40 Mg Tabs (Furosemide) .... Take 1 tablet by  mouth twice a day 5)  Vicodin 5-500 Mg Tabs (Hydrocodone-acetaminophen) .... Take 1 tablet by mouth two times a day as needed 6)  Freestyle Test Strp (Glucose blood) .... Use qid or as directed 7)  Freestyle Lancets Misc (Lancets) .... Use as directed 8)  Colace 100 Mg Caps (Docusate sodium) .... Once daily 9)  Flexeril 10 Mg Tabs (Cyclobenzaprine hcl) .... Take 1 tablet by mouth two times a day as needed back spasms 10)  Anusol-hc 2.5 % Crea (Hydrocortisone) .... Apply to rectum as needed for hemmorhoidal discomfort 11)  Humulin 70/30 70-30 % Susp (Insulin isophane &  regular) .... Inject subcutaneously 15 units with breakfast and 10 units with dinner and increase  as directed 12)  Bd Insulin Syringe Microfine 28g X 1/2" 0.5 Ml Misc (Insulin syringe-needle u-100) .... Use as directed with insulin two times a day 13)  Prilosec Otc 20 Mg Tbec (Omeprazole magnesium) .... Once daily   Orders Added: 1)  Est. Patient Level II [16109]

## 2010-03-07 ENCOUNTER — Other Ambulatory Visit: Payer: Self-pay | Admitting: Internal Medicine

## 2010-03-07 DIAGNOSIS — E119 Type 2 diabetes mellitus without complications: Secondary | ICD-10-CM

## 2010-03-08 NOTE — Assessment & Plan Note (Signed)
Summary: 6 WK ROV//SLM   Vital Signs:  Patient profile:   65 year old female Temp:     98.2 degrees F oral Pulse rate:   94 / minute Pulse rhythm:   regular BP sitting:   146 / 86  (left arm) Cuff size:   large  Vitals Entered By: Alfred Levins, CMA (February 17, 2010 11:50 AM) CC: discuss labs   CC:  discuss labs.  History of Present Illness:  Follow-Up Visit      This is a 65 year old woman who presents for Follow-up visit.  The patient denies chest pain and palpitations.  Since the last visit the patient notes no new problems or concerns.  The patient reports taking meds as prescribed, not monitoring BP, not monitoring blood sugars, dietary noncompliance, and not exercising.  When questioned about possible medication side effects, the patient notes none.    All other systems reviewed and were negative except for chronic fatigue, chronic back pain  Current Medications (verified): 1)  Micardis 80 Mg  Tabs (Telmisartan) .... One By Mouth Daily 2)  Glipizide 10 Mg Tabs (Glipizide) .... Take 1 Tablet By Mouth Once A Day 3)  Potassium Chloride Crys Cr 20 Meq Cr-Tabs (Potassium Chloride Crys Cr) .... Take 1 Tablet By Mouth Two Times A Day 4)  Furosemide 40 Mg Tabs (Furosemide) .... Take 1 Tablet By Mouth Twice A Day 5)  Vicodin 5-500 Mg Tabs (Hydrocodone-Acetaminophen) .... Take 1 Tablet By Mouth Two Times A Day As Needed 6)  Freestyle Test   Strp (Glucose Blood) .... Use Qid or As Directed 7)  Freestyle Lancets   Misc (Lancets) .... Use As Directed 8)  Colace 100 Mg Caps (Docusate Sodium) .... Once Daily 9)  Flexeril 10 Mg Tabs (Cyclobenzaprine Hcl) .... Take 1 Tablet By Mouth Two Times A Day As Needed Back Spasms 10)  Anusol-Hc 2.5 % Crea (Hydrocortisone) .... Apply To Rectum As Needed For Hemmorhoidal Discomfort 11)  Humulin 70/30 70-30 % Susp (Insulin Isophane & Regular) .... Inject Subcutaneously 15 Units With Breakfast and 15 Units With Dinner and Increase  As Directed 12)  Bd  Insulin Syringe Microfine 28g X 1/2" 0.5 Ml Misc (Insulin Syringe-Needle U-100) .... Use As Directed With Insulin Two Times A Day 13)  Prilosec Otc 20 Mg Tbec (Omeprazole Magnesium) .... Once Daily 14)  Multivitamins  Tabs (Multiple Vitamin) .Marland Kitchen.. 1 By Mouth Once Daily  Allergies (verified): 1)  Ibuprofen (Ibuprofen)  Past History:  Past Medical History: Last updated: 04/12/2009 Depression Diabetes mellitus, type II Hypertension Renal failure post op osteo arthritis shingles 2008 Hyperlipidemia GERD  Past Surgical History: Last updated: 11/01/2005 knee arthroscopies (bilateral) gastric banding bladder sling Total knee replacement  Social History: Last updated: 06/30/2007 Married Never Smoked Regular exercise-no a lot of stress related to bipolar daughter  Risk Factors: Exercise: no (05/06/2006)  Risk Factors: Smoking Status: never (07/15/2009)  Physical Exam  General:  alert and well-developed.   Head:  normocephalic and atraumatic.   Eyes:  pupils equal and pupils round.   Neck:  No deformities, masses, or tenderness noted. Chest Wall:  No deformities, masses, or tenderness noted. Lungs:   clear to auscultation Heart:   regular rate Abdomen:   morbidly obese active bowel sounds Extremities:   2+ edema to the midcalf bilaterally.   Impression & Recommendations:  Problem # 1:  DIABETES MELLITUS, TYPE II (ICD-250.00) discussed needs  Her updated medication list for this problem includes:    Micardis 80  Mg Tabs (Telmisartan) ..... One by mouth daily    Glipizide 10 Mg Tabs (Glipizide) .Marland Kitchen... Take 1 tablet by mouth once a day    Humulin 70/30 70-30 % Susp (Insulin isophane & regular) ..... Inject subcutaneously 20units with breakfast and 20 units with dinner and increase  as directed  Problem # 2:  HYPERLIPIDEMIA (ICD-272.4)  Labs Reviewed: SGOT: 43 (02/10/2010)   SGPT: 39 (02/10/2010)  Prior 10 Yr Risk Heart Disease: Not enough information (11/01/2005)    HDL:29.80 (02/10/2010), 30.10 (10/11/2009)  LDL:46 (02/10/2010), 47 (10/11/2009)  Chol:92 (02/10/2010), 96 (10/11/2009)  Trig:82.0 (02/10/2010), 94.0 (10/11/2009)  Problem # 3:  HYPERTENSION (ICD-401.9) not as well controlled desperately needs to lose weight.  Her updated medication list for this problem includes:    Micardis 80 Mg Tabs (Telmisartan) ..... One by mouth daily    Furosemide 40 Mg Tabs (Furosemide) .Marland Kitchen... Take 1 tablet by mouth twice a day  BP today: 146/86 Prior BP: 132/84 (01/17/2010)  Prior 10 Yr Risk Heart Disease: Not enough information (11/01/2005)  Labs Reviewed: K+: 4.5 (02/10/2010) Creat: : 1.0 (02/10/2010)   Chol: 92 (02/10/2010)   HDL: 29.80 (02/10/2010)   LDL: 46 (02/10/2010)   TG: 82.0 (02/10/2010)  Problem # 4:  OBESITY (ICD-278.00)  I have again discussed with her that her obesity is the cause of essentially all of her medical problems. I've advised aggressive weight loss. Low calorie diet. Regular exercise program.  Complete Medication List: 1)  Micardis 80 Mg Tabs (Telmisartan) .... One by mouth daily 2)  Glipizide 10 Mg Tabs (Glipizide) .... Take 1 tablet by mouth once a day 3)  Potassium Chloride Crys Cr 20 Meq Cr-tabs (Potassium chloride crys cr) .... Take 1 tablet by mouth two times a day 4)  Furosemide 40 Mg Tabs (Furosemide) .... Take 1 tablet by mouth twice a day 5)  Vicodin 5-500 Mg Tabs (Hydrocodone-acetaminophen) .... Take 1 tablet by mouth two times a day as needed 6)  Freestyle Test Strp (Glucose blood) .... Use qid or as directed 7)  Freestyle Lancets Misc (Lancets) .... Use as directed 8)  Colace 100 Mg Caps (Docusate sodium) .... Once daily 9)  Flexeril 10 Mg Tabs (Cyclobenzaprine hcl) .... Take 1 tablet by mouth two times a day as needed back spasms 10)  Anusol-hc 2.5 % Crea (Hydrocortisone) .... Apply to rectum as needed for hemmorhoidal discomfort 11)  Humulin 70/30 70-30 % Susp (Insulin isophane & regular) .... Inject subcutaneously  20units with breakfast and 20 units with dinner and increase  as directed 12)  Bd Insulin Syringe Microfine 28g X 1/2" 0.5 Ml Misc (Insulin syringe-needle u-100) .... Use as directed with insulin two times a day 13)  Prilosec Otc 20 Mg Tbec (Omeprazole magnesium) .... Once daily 14)  Multivitamins Tabs (Multiple vitamin) .Marland Kitchen.. 1 by mouth once daily  Patient Instructions: 1)  Please schedule a follow-up appointment in 4 months. 2)  labs one week prior to visit 3)  lipids---272.4 4)  lfts-995.2 5)  bmet-995.2 6)  A1C-250.02 7)       Orders Added: 1)  Est. Patient Level IV [16109]

## 2010-03-10 ENCOUNTER — Other Ambulatory Visit: Payer: Self-pay | Admitting: *Deleted

## 2010-03-10 MED ORDER — INSULIN NPH ISOPHANE & REGULAR (70-30) 100 UNIT/ML ~~LOC~~ SUSP: SUBCUTANEOUS | Status: DC

## 2010-06-09 ENCOUNTER — Other Ambulatory Visit (INDEPENDENT_AMBULATORY_CARE_PROVIDER_SITE_OTHER): Payer: BC Managed Care – PPO | Admitting: Internal Medicine

## 2010-06-09 DIAGNOSIS — T887XXA Unspecified adverse effect of drug or medicament, initial encounter: Secondary | ICD-10-CM

## 2010-06-09 LAB — HEPATIC FUNCTION PANEL
ALT: 35 U/L (ref 0–35)
Alkaline Phosphatase: 81 U/L (ref 39–117)
Bilirubin, Direct: 0.2 mg/dL (ref 0.0–0.3)
Total Bilirubin: 0.5 mg/dL (ref 0.3–1.2)

## 2010-06-09 LAB — BASIC METABOLIC PANEL
BUN: 14 mg/dL (ref 6–23)
CO2: 27 mEq/L (ref 19–32)
Chloride: 101 mEq/L (ref 96–112)
GFR: 67.96 mL/min (ref >60.00)
Glucose, Bld: 238 mg/dL — ABNORMAL HIGH (ref 70–99)
Potassium: 4.5 mEq/L (ref 3.5–5.1)

## 2010-06-09 LAB — LIPID PANEL: VLDL: 15.8 mg/dL (ref 0.0–40.0)

## 2010-06-13 NOTE — Discharge Summary (Signed)
NAMECORDELL, COKE                 ACCOUNT NO.:  [id number]   MEDICAL RECORD NO.:  [id number]          PATIENT TYPE:  INP   LOCATION:  3706                         FACILITY:  [facility]   PHYSICIAN:  Gordy Savers, MDDATE OF BIRTH:  1945-11-18   DATE OF ADMISSION:  01/02/2008  DATE OF DISCHARGE:  01/03/2008                               DISCHARGE SUMMARY   FINAL DIAGNOSIS:  Right-sided transient ischemic attack.   ADDITIONAL DIAGNOSES:  1. Diabetes mellitus 2.  2. Hypertension.  3. Gastroesophageal reflux disease.   DISCHARGE MEDICATIONS:  1. Aspirin 325 mg daily.  2. Simvastatin 20 mg daily.  3. Protonix 40 mg daily.  4. Micardis 80 mg daily.  5. Glipizide 5 mg b.i.d.  6. Potassium chloride 20 mEq b.i.d.  7. Metformin 1000 mg b.i.d.  8. Lasix 40 mg b.i.d.  9. Byetta 5 mcg subcutaneously b.i.d.   HISTORY OF PRESENT ILLNESS:  The patient is a 65 year old female with a  history of hypertension and diabetes.  The patient presented to the  emergency room with chief complaint of dizziness, left-sided weakness,  and a special heaviness involving her left leg when ambulating.  She  also noted a possible left-sided facial droop.  Symptoms began 2 days  prior to admission and initially started with dizziness that improved.  On the day prior to admission, the patient had some mild dysarthria,  left facial drooping, and left-sided weakness.  On the day of admission,  her only complaint was a slight heaviness involving her left leg when  ambulating.  The patient was evaluated in the emergency department where  a head CT revealed no acute abnormalities.  Laboratory studies were  noncontributory.  Blood sugar 139 on admission.  CT of the head without  contrast revealed normal ventricular size.  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  No other white matter  lesions were noted.  There was no evidence of hemorrhage.   The patient was admitted to our  telemetry setting and observed for 24  hours.  On the second hospital day, the patient had no new neurological  symptoms.  Clinical exam remained unremarkable.  Subjectively, she felt  some heaviness involving the left leg, but there was no demonstrable  weakness.  There was a suggestion of a slight limp with ambulation.  During the period of observation, her heart rhythm remained normal.  The  patient has had prior Lap-Band surgery with metallic prosthesis in place  and is unable to have MRI scanning.  During the hospital period, she was  maintained on her diabetic medications as well as her antihypertensive.  Blood pressure and blood sugars remained normal.   DISPOSITION:  The patient was discharged today on the medical regimen  listed above.  She will continue her home blood pressure medications.  Additionally, we will take aspirin 325 mg daily as well as simvastatin  20 mg daily.  The patient has been asked to follow up with her primary care Eljay Lave  within the next week and will be scheduled for outpatient carotid  Doppler evaluation.  The patient will report any new neurological  findings.   CONDITION ON DISCHARGE:  Stable.      Gordy Savers, MD  Electronically Signed     PFK/MEDQ  D:  01/03/2008  T:  01/03/2008  Job:  [id number]   cc:   Valetta Mole. Swords, MD

## 2010-06-15 ENCOUNTER — Encounter: Payer: Self-pay | Admitting: Internal Medicine

## 2010-06-16 ENCOUNTER — Ambulatory Visit (INDEPENDENT_AMBULATORY_CARE_PROVIDER_SITE_OTHER): Payer: BC Managed Care – PPO | Admitting: Internal Medicine

## 2010-06-16 VITALS — BP 120/80 | Temp 98.2°F | Wt 278.0 lb

## 2010-06-16 DIAGNOSIS — E119 Type 2 diabetes mellitus without complications: Secondary | ICD-10-CM

## 2010-06-16 DIAGNOSIS — N19 Unspecified kidney failure: Secondary | ICD-10-CM

## 2010-06-16 DIAGNOSIS — E785 Hyperlipidemia, unspecified: Secondary | ICD-10-CM

## 2010-06-16 DIAGNOSIS — E669 Obesity, unspecified: Secondary | ICD-10-CM

## 2010-06-16 DIAGNOSIS — M545 Low back pain: Secondary | ICD-10-CM

## 2010-06-16 DIAGNOSIS — I10 Essential (primary) hypertension: Secondary | ICD-10-CM

## 2010-06-16 LAB — POCT URINALYSIS DIPSTICK
Bilirubin, UA: NEGATIVE
Nitrite, UA: POSITIVE

## 2010-06-16 MED ORDER — METFORMIN HCL 500 MG PO TABS: 500.0000 mg | ORAL_TABLET | Freq: Two times a day (BID) | ORAL | Status: DC

## 2010-06-16 MED ORDER — CIPROFLOXACIN HCL 250 MG PO TABS: 250.0000 mg | ORAL_TABLET | Freq: Two times a day (BID) | ORAL | Status: AC

## 2010-06-16 NOTE — Discharge Summary (Signed)
[name], [name]                 ACCOUNT NO.:  [id number]   MEDICAL RECORD NO.:  [id number]          PATIENT TYPE:  INP   LOCATION:  0361                         [facility]ACILITY:  [facility]   PHYSICIAN:  Richardean Sale, M.D.   DATE OF BIRTH:  1945-09-03   DATE OF ADMISSION:  05/31/2004  DATE OF DISCHARGE:  06/02/2004                                 DISCHARGE SUMMARY   ADMITTING DIAGNOSES:  Symptomatic cystocele, rectocele, and stress urinary  incontinence.   DISCHARGE DIAGNOSES:  Symptomatic cystocele, rectocele, and stress urinary  incontinence.   PROCEDURE:  Anterior posterior colporrhaphy performed by Dr. Richardean Sale  on May 31, 2004, and suburethral sling placed and performed on May 31, 2004,  by Dr. Marcine Matar.   HOSPITAL COURSE/HISTORY OF PRESENT ILLNESS:  This is a 65 year old white  female, who presented on May 31, 2004, for surgical repair of symptomatic  cystocele, rectocele, and stress urinary incontinence.  The patient  underwent uncomplicated procedure on May 31, 2004.  On postoperative day #0,  the patient had a single episode of transient hypoxia with an oxygen  saturation down to 77 while she was using a PCA for medication shortly after  procedure.  The patient received 1dose of intravenous Narcan with prompt  reversal of the hypoxia.  The patient's medical history is significant for  hypertension and diet controlled diabetes.  During her hospital stay, blood  pressure decreased to the 90s/50s.  The patient maintained a normal pulse  and normal oxygenation, and her blood pressure responded to IV fluids.  The  patient's urinary output did decrease to 20-30 mL/hr but responded to IV  fluids as well.  The patient's blood sugars were found to be elevated on  admission with a sugar of approximately 200.  The patient was given sliding-  scale insulin during her hospital stay.  On postoperative day #2, the  patient was hemodynamically stable, was ambulating and voiding  without  difficulty, passing flatus, was tolerating a regular diet, and her pain was  adequately controlled with oral pain medication, and she was discharged to  home on Jun 02, 2004, in good condition.   DISPOSITION:  To home.   CONDITION:  Stable.   FOLLOW UP:  The patient will follow up in 4 weeks with Dr. Richardean Sale  for a routine postoperative visit and again in 2 weeks with Dr. Retta Diones  for a postoperative visit.  In addition, I have instructed the patient to  contact her primary care physician, Dr. Birdie Sons, for further evaluation  and management suggestions of her diabetes, as she did require insulin while  she was in the hospital.   MEDICATIONS:  1.  Percocet 1-2 tabs p.o. q.4-6h. p.r.n. pain.  2.  Tylenol and ibuprofen p.r.n.  3.  The patient is to continue on her lisinopril as well as her Paxil which      was prescribed for her prior to this procedure.   INSTRUCTIONS:  The patient is to call for any fever greater than 100.4,  vomiting, difficulty with bowel movement, difficulty with urination, pain  not  relieved with pain medications, to avoid placing anything in the vagina  (no intercourse, tampons, or douche) for 8 weeks, and she is to avoid any  heavy lifting (less than 10 pounds) for 8 weeks.   LABORATORY STUDIES:  Hemoglobin postop day #1 was 11.3, postop day #2 stable  at 11.1, white count was 10.7.  Complete metabolic panel on postop day #1  showed a creatinine of 1.4.  Repeat on postop day #2 showed creatinine  decreased to 1.1.      JW/MEDQ  D:  06/02/2004  T:  06/03/2004  Job:  [id number]   cc:   Bertram Millard. Dahlstedt, M.D.  [address]  [place]  [place] [address]  Fax: [phone]   Paula Mole. Swords, M.D. [facility]

## 2010-06-16 NOTE — Consult Note (Signed)
[name], [name]                 ACCOUNT NO.:  [id number]   MEDICAL RECORD NO.:  [id number]          PATIENT TYPE:  INP   LOCATION:  0151                         [facility]ACILITY:  [facility]   PHYSICIAN:  Cynthia B. Eliott Nine, M.D.DATE OF BIRTH:  1945/07/28   DATE OF CONSULTATION:  DATE OF DISCHARGE:                                   CONSULTATION   REASON FOR CONSULTATION:  Acute renal failure.   We are asked to see this 65 year old white female with a history of  hypertension, diabetes, and obesity secondary to postop acute renal failure  following left total knee replacement.  Preoperative labs on November 22, 2004 revealed a baseline serum creatinine of 0.9 and urinalysis which was  negative for protein.  She has a prior history of an ultrasound in September  showing normal sized kidneys.  She has had hypotension into the 80s since  surgery and at least for the last 36 hours.  A drop in hemoglobin from 13.3  to 9.7 and no urine output for 24 hours.  Creatinine has gone from 0.9 to  2.4 to 4.4.  She was on an ACE inhibitor prior to the time of surgery.   She is presently being transferred to the ICU for central line placement and  closer monitoring.  She has received Lasix 20 and 40 mg with no response.  She is presently getting IV fluids at 500 cc/hr.  Blood pressure remains  low.  She has been on an ACE inhibitor which is presently on hold.   PAST MEDICAL HISTORY:  1.  Diabetes.  2.  Hypertension.  3.  By her history, an episode of postoperative acute renal failure      following a gastric banding for obesity at [facility] several years ago.  She      responded to volume replacement and high dose diuretics.  She did not      require hemodialysis.  4.  History of laparoscopic gastric banding for obesity.  5.  Gallstones.  6.  History of cystocele and rectocele repair.  7.  History of reflux.   CURRENT MEDICATIONS:  Albuterol, HCTZ, insulin, Zestril, which is on hold,  Glucophage, which is on  hold, Paxil, Protonix, and Coumadin.  Her p.r.n.  medicines include morphine, Robaxin, insulin, Atrovent, albuterol,  Phenergan, Percocet, Reglan, Tylenol, Compazine, Zofran, Xanax, Ativan, and  Xopenex.   She is allergic to or intolerant of CONTRAST, NONSTEROIDAL ANTI-INFLAMMATORY  DRUGS, IODINE, and HYDROMORPHONE.   FAMILY HISTORY:  Noncontributory.   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  She is married and has three children.  She  does not use alcohol or tobacco.   REVIEW OF SYSTEMS:  Positive for shortness of breath, left knee pain.  No  nausea, vomiting, or diarrhea.   PHYSICAL EXAMINATION:  VITAL SIGNS:  Blood pressure 109/52, receiving 500  cc/hr of normal saline.  O2 sats are 95% on 2 liters.  Heart rate is 95-111.  GENERAL:  She is a very pleasant but overweight woman.  No  obvious distress.  I cannot visualize her neck veins due to her obesity.  LUNGS:  Decreased breath sounds bilaterally without crackles, wheezes, or  other adventitious sounds.  HEART:  Normal S1 and S2.  No S3.  ABDOMEN:  She has an obese abdomen which is nontender.  Bowel sounds are  present.  I cannot hear any abdominal bruits.  EXTREMITIES:  She has no definite edema of the lower extremities.  Her left  knee is immobilized.   Pertinent laboratory includes a hemoglobin of 9.7, down from 13.2 on  admission.  Creatinine 4.4, up from 0.9 preoperatively and a potassium of  5.4.  Total CPK is 5406.   IMPRESSION:  A 65 year old white female with diabetes, hypertension, morbid  obesity, chronic ACE inhibitor therapy, and status post total knee  replacement, who has developed acute renal failure in the setting of  surgery, hypotension, and hemoglobin variation.  Her acute renal failure is  most likely hemodynamic in origin.  I would doubt a component of  rhabdomyolysis, as usually CPKs are much higher for this diagnosis.   RECOMMENDATIONS:   1.  Agree with ICU transfer, PICC line, and CVP monitoring.  2.  Would challenge with high dose Lasix, 160 to 200 mg IV if her CVP is      adequate (greater than or equal to 15).  If CVP is low, would continue      giving her volume.  3.  Recheck potassium and if it comes down, could give her packed cells for      blood pressure and volume support.  4.  Check urine sodium and creatinine if and when she makes urine.  5.  Would get a bladder scan to make certain the Foley is in the correct      position, since she has had prior bladder surgery and the anatomy may be      abnormal.  6.  DC ACE inhibitor and Glucophage as you have done.  7.  Needs better blood pressure.  If volume is adequate, would consider      pressor support.   Thanks for asking [place] to see her.  Will follow closely with you.           ______________________________  Duke Salvia. Eliott Nine, M.D.     CBD/MEDQ  D:  11/29/2004  T:  11/29/2004  Job:  [id number]

## 2010-06-16 NOTE — Op Note (Signed)
. [facility]  Patient:    [name], SPIKES Visit Number: [id number] MRN: [id number]          Service Type: DSU Location: [facility] Attending Physician:  Twana First Dictated by:   Elana Alm Thurston Hole, M.D. Proc. Date: 01/20/01 Admit Date:  01/20/2001                             Operative Report  PREOPERATIVE DIAGNOSIS:  Left knee medial meniscus tear.  POSTOPERATIVE DIAGNOSIS:  Left knee medial meniscus tear.  PROCEDURES: 1. 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 2. Left knee chondroplasty.  SURGEON:  Elana Alm. Thurston Hole, M.D.  ASSISTANT:  Julien Girt, P.A.  ANESTHESIA:  General.  Operative time 30 minutes.  No complications.  INDICATIONS FOR PROCEDURE:  [name] is a 65 year old woman who has had significant left knee pain for the past six months increasing in nature with signs and symptoms and MRI documenting a medial meniscus tear, who has failed conservative care and is now to undergo an arthroscopy.  DESCRIPTION OF PROCEDURE:  This lady was brought to the operating room on January 20, 2001, placed on the operative table in the supine position. After an adequate level of general anesthesia was obtained, her left knee was examined under anesthesia.  She had range of motion from 0 to 125 degrees, 1-2+ crepitation, knee stable.  Ligamentous exam with normal patellar tracking.  Left leg was prepped using sterile Betadine and draped using sterile technique.  Originally through an inferolateral portal, the arthroscope with a pump attached was placed into an inferior medial portal and arthroscopic probe was placed.  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  Medial tibial plateau showed 25-30% grade 3 changes as was debrided.  Medial meniscus showed tearing of the posterior horn of which  50% was resected back to a stable rim. The intercondylar notch was inspected and the anterior/posterior cruciate ligaments were normal.  Lateral compartment inspected.  Articular cartilage, lateral femoral condyle revealed plateau shift, mild grade 1-2 chondromalacia.  Lateral meniscus was probed and this was found to be normal.  Patellofemoral joint inspected.  Mild grade 1-2 chondromalacia noted.  Patella tracked normally.  Moderate synovitis and the medial lateral gutters were debrided, otherwise they were free of pathology.  After this was done, it was felt that all pathology had been satisfactorily addressed.  The instruments were removed.  Portals were closed with 3-0 nylon suture and injected with 0.25% Marcaine with epinephrine and 4 mg of morphine.  Sterile dressing was applied and the patient awakened and taken to the recovery room in stable condition.  FOLLOW-UP CARE:  Ms. Kirtley will be followed as an outpatient on [place] NX for pain and Celebrex.  We will see her back in the office in a week for sutures out and follow-up. Dictated by:   Elana Alm Thurston Hole, M.D. Attending Physician:  Twana First DD:  01/20/01 TD:  01/20/01 Job: [id number]

## 2010-06-16 NOTE — H&P (Signed)
NAMEELIVIA, ROBOTHAM                 ACCOUNT NO.:  [id number]   MEDICAL RECORD NO.:  [id number]          PATIENT TYPE:  INP   LOCATION:  NA                           FACILITY:  [facility]   PHYSICIAN:  Richardean Sale, M.D.   DATE OF BIRTH:  13-Aug-1945   DATE OF ADMISSION:  05/31/2004  DATE OF DISCHARGE:                                HISTORY & PHYSICAL   PREOPERATIVE DIAGNOSIS:  Symptomatic cystocele, rectocele, and stress  urinary incontinence.   HISTORY OF PRESENT ILLNESS:  This is a 65 year old gravida 3, para 2, 0-1-2  obese white female who has had progressively worsening stress urinary  incontinence symptoms as well as complaints of pressure in the vagina.  The  patient was told she had a cystocele and rectocele a few years ago, and her  symptoms have progressively worsened.  She is status post abdominal  hysterectomy in 1994 and had a Burch procedure performed at the same time,  but she has continued to have frequent loss of urine with Valsalva and  cough.  She also complains of having to splint to move her bowels on  occasion, but she denies any significant constipation and denies any  involuntary loss of stool or flatus.  Patient presents today for anterior  and posterior colporrhaphy, possible perineorrhaphy, and placement of a  suburethral sling for control of stress urinary incontinence.  The  suburethral sling is to be placed by Dr. Retta Diones.   REVIEW OF SYSTEMS:  Denies chest pain, shortness of breath, abdominal pain.  Positive for pressure in the vagina.  Denies anything protruding from the  vagina.  Denies any unusual discharge, vaginal bleeding.  Positive for loss  of urine with cough or Valsalva.   PAST MEDICAL HISTORY:  1.  Hypertension.  2.  Diet-controlled diabetes.  3.  Depression.  4.  Obesity.   PAST SURGICAL HISTORY:  1.  Total abdominal hysterectomy with Burch procedure in 1994.  2.  Tonsillectomy.  3.  Bilateral knee arthroscopies.  4.  Lap band surgery  at [facility] in 2004.   OBSTETRICAL HISTORY:  Vaginal deliveries x2.  Miscarriage x1.   GYNECOLOGIC HISTORY:  Status post TAH.  Currently not on hormone replacement  therapy.  Does not recall any abnormal Pap smears or sexually transmitted  infections.  Last Pap smear less than one year ago was reported as normal,  per the patient.   SOCIAL HISTORY:  She is married.  Denies tobacco or alcohol use.  Is a  retired Engineer, civil (consulting).   FAMILY HISTORY:  Positive for stroke.  Positive for ovarian cancer in her  mother at age 32.  Positive for hypertension and heart disease, diabetes.   MEDICATIONS:  Lisinopril, daily multivitamin, paxil   ALLERGIES:  CT CONTRAST.   PHYSICAL EXAMINATION:  VITAL SIGNS:  Blood pressure 126/76.  Weight 252  pounds.  Height 5 foot 4.  GENERAL:  She is a well-developed, well-nourished, obese white female who is  in no apparent distress.  NECK:  Supple without thyromegaly or adenopathy.  LUNGS:  Clear to auscultation bilaterally.  HEART:  Regular rate  and rhythm.  ABDOMEN:  Obese, soft, nontender, nondistended with no palpable masses.  PELVIC:  External genitalia are normal.  The vagina is pink, moist, and  rugated with a moderate cystocele and a moderate rectocele.  There is good  vaginal cuff support.  There is significant loss of urine with cough or  Valsalva.  RECTAL:  There are no masses, and there is good rectal tone.  There is no  enterocele.  Adnexa are not palpable.  EXTREMITIES:  Trace edema bilaterally.  Nontender.   ASSESSMENT:  A 65 year old white female, gravida 3, para 2, 0-1-2 with  symptomatic stress urinary incontinence and symptomatic cystocele and  rectocele.   PLAN:  Will proceed with anterior and posterior repair as well as  suburethral sling placement by Dr. Retta Diones.  The risks, benefits and  alternatives have been reviewed with the patient in detail.  I reviewed the  risks of the surgery, which include but are not limited to, bleeding,   infection, fistula formation, which could require additional surgeries in  the future, the possibility of recurrent prolapse, particularly given her  morbid obesity, and the possibility of discomfort with intercourse after  procedure.  The risks of anesthesia were reviewed.  We reviewed the risk of  DVT, pulmonary embolus, and cardiac events.  The postoperative course has  been reviewed with the patient.  I reviewed that she will need a catheter in  place and have vaginal packing in place on the night after the procedure.  The patient voices understanding  of all of these above risks and desires to  proceed.  She has seen her primary physician, Dr. Cato Mulligan, preoperatively and  has been medically cleared for surgery.  We will proceed with anterior and  posterior repair at the time of her suburethral sling placement.  All  questions have been answered, and informed consent obtained.      JW/MEDQ  D:  05/30/2004  T:  05/30/2004  Job:  [id number]

## 2010-06-16 NOTE — Consult Note (Signed)
NAMEDEVINN, [name]                 ACCOUNT NO.:  [id number]   MEDICAL RECORD NO.:  [id number]          PATIENT TYPE:  INP   LOCATION:  0155                         FACILITY:  [facility]   PHYSICIAN:  Adolph Pollack, M.D.DATE OF BIRTH:  [birth year]   DATE OF CONSULTATION:  10/09/2004  DATE OF DISCHARGE:                                   CONSULTATION   REQUESTING PHYSICIAN:  Rene Paci, M.D.   REASON FOR CONSULTATION:  Probable choledocholithiasis.   HISTORY OF PRESENT ILLNESS:  [name] is a 65 year old female whose past  medical history is remarkable for having a laparoscopic gastric band placed  for obesity in March, 2003.  She has missed her followup at [facility] for it this past summer.  Over several weeks, she had some  intermittent chest pain, not necessarily exertional; however, she had a  significant episode on October 07, 2004 with some sharp substernal pain.  Was unrelenting.  Diaphoresis.  Shortness of breath.  Some nausea.  Radiation to her jaw.  She presented to the emergency department.  She was  given some morphine and nitroglycerin in the ED, and she says her pain  improved with that.  She subsequently was admitted to have a myocardial  infarction ruled out and was noted to mild elevation of SGOT and SGPT.  She  did rule out for myocardial infarction.  On an intra-abdominal ultrasound,  no obvious cholelithiasis was seen but what was seen was a borderline  dilated common bile duct and a strong suggestion of distal common bile duct  stone.  The pain now has migrated more to the right upper quadrant area and  into the subscapular region and continues to be intermittent.  I have been  asked to see her because of the choledocholithiasis and the concern that  ERCP cannot be done.  Also, because she has had laparoscopic gastric  banding.  She states that there is some metal in her gastric band, and she  has been told she can never had an MRI.   Morphine does control her pain.   PAST MEDICAL HISTORY:  1.  Type 2 diabetes.  2.  Morbid obesity.  3.  Hypertension.  4.  Degenerative joint disease.   PAST SURGICAL HISTORY:  1.  Bilateral knee arthroscopies.  2.  Hysterectomy.  3.  Laparoscopic gastric band.   DRUG ALLERGIES:  1.  DILAUDID.  2.  IV DYE.  3.  NSAIDS.   HOME MEDICATIONS:  Lisinopril, Glucophage, stool softener.  Took Paxil in  the past but no longer.  Also takes vitamins.  She is on Unasyn here in the  hospital as well.  Is N.P.O.   REVIEW OF SYSTEMS:  She is a Engineer, civil (consulting).  Denies any tobacco or alcohol use at  the current time.  She is married.   FAMILY HISTORY:  Positive for heart disease.   REVIEW OF SYSTEMS:  CARDIOVASCULAR:  She denies a myocardial infarction.  NEUROLOGIC:  Denies stroke or seizures.  PULMONARY:  Denies acute or chronic  lung disease.  HEMATOLOGIC:  She states  that she has had thrombocytosis at  times.  No known bleeding disorders or DVT.  GI:  She denies any hepatitis,  peptic ulcer disease, or diverticulitis.  GU:  She denies any kidney stones.  ENDOCRINE:  No thyroid disease or hypercholesterolemia.   PHYSICAL EXAMINATION:  VITAL SIGNS:  Temperature 97.8, blood pressure  134/70, pulse 92.  Room air saturation is 98%.  GENERAL:  An obese female being 5 feet 4 inches tall, weighing about 260  pounds.  HEENT:  Extraocular movements are intact.  No icterus.  RESPIRATORY:  Breath sounds are clear and equal.  Respirations are  unlabored.  CARDIOVASCULAR:  Regular rate and rhythm.  I do not hear a murmur.  No JVD.  ABDOMEN:  Soft.  There is a lower transverse scar present.  Multiple small  upper abdominal scars with a palpable port in the epigastric region.  There  is some mild-to-moderate right upper quadrant tenderness with slight  guarding.  She appears to be distended.  Hypoactive bowel sounds noted.  SKIN:  No jaundice.   LABORATORY DATA:  White cell count today is 10,400, down from  11,000 range.  Her SGOT and SGPT have normalized, but now her total bilirubin is up to 1.3  with a normal alkaline phosphatase.   IMPRESSION:  1.  Probable choledocholithiasis with a defect seen in the common bile duct      on ultrasound as well as borderline upper limits of normal size common      bile duct and elevation of total bilirubin.  2.  Morbid obesity, status post laparoscopic banding procedure.  I have      spoken with one of the bariatric surgeons at [facility] about this.      He sees no contraindication to deflating the band and performing an      upper endoscopy/ERCP.   RECOMMENDATIONS:  I will have one of my partners see her regarding letting  the band down and then would suggest obtaining a GI consultation and  performing an ERCP, either at the same hospitalization or later she could  have laparoscopic cholecystectomy performed.  We discussed the plan, and I  have discussed the procedure and the risks of cholecystectomy.  The risks  include but are not limited to bleeding, infection, wound healing problems,  general anesthesia problems, problems with thick band, gastric band or the  port, accidental injury of the common  bile duct/liver/intestines, common bile leak.  She seems to understand this  and is agreeable with this.  I offered to try to get her back to [facility], and they were glad to take her; however, she  preferred to try to stay here.      Adolph Pollack, M.D.  Electronically Signed     TJR/MEDQ  D:  10/09/2004  T:  10/09/2004  Job:  [id number]   cc:   [name], M.D. [facility]   Rene Paci, M.D. [facility]  [address] [place], [place] [address]

## 2010-06-16 NOTE — Assessment & Plan Note (Signed)
She has lost a few pounds-congratulated She needs to lose about 100 pounds She understands that this will be a slow process.

## 2010-06-16 NOTE — Progress Notes (Signed)
  Subjective:    Patient ID: [name], female    DOB: October 29, 1945, 65 y.o.   MRN: [id number]  HPI   patient comes in for followup of multiple medical problems including type 2 diabetes, hyperlipidemia, hypertension. The patient does not check blood sugar or blood pressure at home. The patetient does not follow an exercise or diet program. The patient denies any polyuria, polydipsia.  In the past the patient has gone to diabetic treatment center. The patient is tolerating medications  Without difficulty. The patient does admit to medication compliance. (rarely rides a stationary bike)  sxs of UTI for 3 days Past Medical History  Diagnosis Date  . Depression   . Diabetes mellitus   . Hypertension   . Arthritis   . Renal failure   . Hyperlipidemia   . GERD (gastroesophageal reflux disease)    Past Surgical History  Procedure Date  . Knee arthroscopy     bilateral  . Gastric bypass   . Incontinence surgery   . Replacement total knee     reports that she has never smoked. She does not have any smokeless tobacco history on file. Her alcohol and drug histories not on file. family history is not on file. Allergies  Allergen Reactions  . Ibuprofen     REACTION: SOB, severe swelling  . Iohexol      Desc: pt. codes, no pre meds 10/27/04      Review of Systems  patient denies chest pain, shortness of breath, orthopnea. Denies lower extremity edema, abdominal pain, change in appetite, change in bowel movements. Patient denies rashes, musculoskeletal complaints. No other specific complaints in a complete review of systems.      Objective:   Physical Exam  Well-developed well-nourished female in no acute distress. HEENT exam atraumatic, normocephalic, extraocular muscles are intact. Neck is supple. No jugular venous distention no thyromegaly. Chest clear to auscultation without increased work of breathing. Cardiac exam S1 and S2 are regular. Abdominal exam active bowel sounds, soft,  nontender. Extremities no edema. Neurologic exam she is alert without any motor sensory deficits. Gait is normal.        Assessment & Plan:

## 2010-06-16 NOTE — Op Note (Signed)
NAMESRAH, AKE                 ACCOUNT NO.:  [id number]   MEDICAL RECORD NO.:  [id number]          PATIENT TYPE:  INP   LOCATION:  0361                         [facility]ACILITY:  [facility]   PHYSICIAN:  Richardean Sale, M.D.   DATE OF BIRTH:  [birth year]   DATE OF PROCEDURE:  05/31/2004  DATE OF DISCHARGE:                                 OPERATIVE REPORT   PREOPERATIVE DIAGNOSIS:  Symptomatic cystocele, rectocele, and stress  urinary incontinence.   POSTOPERATIVE DIAGNOSIS:  Symptomatic cystocele, rectocele, and stress  urinary incontinence.   PROCEDURE:  Anterior and posterior colporrhaphy and suburethral sling  placement by Dr. Retta Diones.   SURGEONS:  1.  Richardean Sale, M.D. for the anterior/posterior colporrhaphy.  2.  Bertram Millard. Dahlstedt, M.D. for the sling placement.   ASSISTANT:  Lenoard Aden, M.D. for the anterior/posterior colporrhaphy.   COMPLICATIONS:  None.   ESTIMATED BLOOD LOSS:  250 cc.   URINE OUTPUT:  Greater than 400 cc.   SURGICAL FINDINGS:  Moderate cystocele, moderate rectocele.  Good vaginal  apex support.  Slightly attenuated vagina.   PATHOLOGY:  Specimens none.   INDICATIONS:  This is a 65 year old gravida 3, para 2, 0-2-1 white female  with a symptomatic cystocele and rectocele, who presents for  anterior/posterior colporrhaphy at the time of suburethral sling placement,  given the patient's significant stress urinary incontinence.  The patient's  symptoms of pressure in the vagina as well as her stress urinary  incontinence have worsened over the past few years.  The patient has  frequent loss of urine with Valsalva and cough and also complains of having  to split in order to move her bowels.  She denies any involuntary loss of  stool or flatus.  Given her symptoms, she desires surgical management for  cystocele/rectocele and her stress urinary incontinence.   Prior to the procedure, the risks, benefits and alternatives of the  procedure were  reviewed with the patient in detail.  We reviewed the risks,  which include but are not limited to, hemorrhage requiring transfusion,  infection, injury to the bladder, the ureters, and the rectum, which may  have required additional surgery, either at the time of this procedure or in  the future.  We also reviewed the possibility of fistula formation, which  could require additional surgeries in the future.  Reviewed the possibility  of painful intercourse following the procedure secondary to scarring.  Reviewed the possibility of recurrent pelvic support defects in the future.  Reviewed the risk of DVT, pulmonary embolus, and cardiac events.  Patient  voiced understanding of all of the above risks and agreed to proceed to the  operating room.  Informed consent obtained.   PROCEDURE:  The patient was taken to the operating room, where she was given  a general anesthetic.  She was then prepped and draped in the usual sterile  fashion with Betadine and placed in the dorsal lithotomy position.  A Foley  catheter was placed in the vagina.  Bimanual exam and rectovaginal exam were  performed, which confirmed the presence of a moderate cystocele and  rectocele with no evidence of an enterocele or vaginal vault prolapse.  The  patient's vagina was noted to be slightly attenuated and shortened.  A  posterior weighted speculum was then placed into the vagina, and the apex of  the vagina was grasped with Allis clamps.  Then 5 cc of 1% lidocaine with  epinephrine was injected into the anterior wall of the vagina just beneath  the mucosa for hemostasis and to facilitate dissection.  A vertical incision  was then made in the anterior vaginal mucosa along the length of the  cystocele to approximately 1 cm proximal to the urethra.  The edges of the  mucosa were then held on tension with Allis clamps, and the bladder was then  dissected off from the vaginal mucosa.  The fascia was then identified at  the  edges and was reapproximated in the midline using interrupted Vicryl  suture with good reduction of the cystocele.  Prior to closing the anterior  incision, Dr. Retta Diones entered the room and performed his procedure to  place the suburethral sling.  Once Dr. Retta Diones was finished, I completed  closure of the anterior vaginal mucosa.  Given that the patient's vagina was  already slightly shortened, the vaginal mucosa was not trimmed, and the  edges were brought together in the midline with interrupted Vicryl suture  with good hemostasis.  At this point, attention was then turned to the  rectocele.  The edges of the hymenal ring were grasped with Allis clamps,  and a transverse incision was then made along the posterior fourchette.  There was good support of the perineal body; therefore, a perineorrhaphy was  not performed.  Then 5 cc of 1% lidocaine with epinephrine was then injected  along the posterior wall of the vagina and the mucosa, and Metzenbaum  scissors were then used to dissect the mucosa off the underlying rectum.  The edges of the incisions were then grasped with Allis clamps and held in  tension.  Using sharp and blunt dissection, the vaginal mucosa was  adequately dissected off from the rectum, and the edges of the fascia  identified.  The fascia was then brought together in the midline using  interrupted Vicryl sutures with good reduction of the rectocele and with  good hemostasis.  Again, given that the vagina was already attenuated, the  edges of the vaginal mucosa incision were not trimmed, and the vaginal  mucosa was brought together in the midline with interrupted Vicryl suture  with good hemostasis.  Once the repair was complete, the incisions were  inspected.  They were hemostatic.  A rectovaginal exam was performed, which  revealed good support of the rectovaginal septum and good reduction of the rectocele and cystocele.  At this point, the vagina was packed with 1  inch  plain gauze soaked in Estrace cream.  The Foley catheter was already in  place.  The patient was taken out of the dorsal lithotomy position and was  awakened from general anesthesia and taken to the recovery room in good  condition.  There were no complications.  All sponge, lap, needle, and  instrument counts were correct x2.      JW/MEDQ  D:  05/31/2004  T:  05/31/2004  Job:  [id number]

## 2010-06-16 NOTE — Consult Note (Signed)
[name], [name]                 ACCOUNT NO.:  [id number]   MEDICAL RECORD NO.:  [id number]          PATIENT TYPE:  INP   LOCATION:  0151                         FACILITY:  [facility]   PHYSICIAN:  Ok Anis, NPDATE OF BIRTH:  Aug 30, 1945   DATE OF CONSULTATION:  11/29/2004  DATE OF DISCHARGE:                                   CONSULTATION   PRIMARY CARE PHYSICIAN:  Valetta Mole. Swords, M.D.   PATIENT NAME:  The patient is a 65 year old white female who is status post  left total knee arthroplasty complicated by acute renal failure and chest  pain with elevated cardiac markers.   PROBLEM LIST:  1.  Chest pain. On January 12, 2002 adenosine Myoview.  Minor apical      thinning without ischemia or infarct.  EF 70%.  2.  Type 2 diabetes mellitus.  Hemoglobin A1C 9.4 in September 2006.  3.  GERD.  4.  Cholelithiasis.  5.  History of acute renal failure in postoperative settings.  6.  Asthma.  7.  Osteoarthritis, status post bilateral knee arthroscopies in the past      with left total knee arthroplasty November 27, 2004 by Dr. Lequita Halt.  8.  Status post TAH.  9.  Status post bladder tack.  10. Obesity, status post lap banding in 2004 with let down in October 10, 2004.   HISTORY OF PRESENT ILLNESS:  The patient is a 65 year old white female with  no prior history of CAD.  She does have a history of chest pain with  negative functional study in December 2003.  She was evaluated for chest  pain in September 2006 at [facility] and this was felt to be GI in  nature.  She came in on November 27, 2004 for elective left total knee  arthroplasty with Dr. Lequita Halt.  Postoperative course has been complicated by  acute renal failure and anuria with elevation in creatinine to 4.4 from 0.9  at baseline despite IV fluids and Lasix challenges.  Yesterday morning she  also complained of sudden onset of left chest pressure and heaviness  associated with shortness of breath,  nausea, diaphoresis, lasting less than  five minutes and then resolving spontaneously.  She has continued to have  some left hand and right fingertip numbness.  She has never had pain like  this before.  She was seen by Dr. Felicity Coyer and cardiac markers were sent  with a CK of 5406 and an MB of 106.7 for an index of 2 with normal troponin  of 0.04. We are asked to evaluate her for chest pain in light of these  cardiac markers.   ALLERGIES:  1.  IV CONTRAST.  2.  NSAID (ADVIL) cause edema.  3.  DILAUDID causes over sedation.   CURRENT MEDICATIONS:  1.  Coumadin.  2.  Colace 100 mg twice a day.  3.  Paxil 20 mg daily.  4.  Metformin 500 mg twice a day (on hold).  5.  Lisinopril 20 mg daily (on hold).  6.  Hydrochlorothiazide 25 mg  daily (on hold).  7.  Protonix 40 mg daily.  8.  Lantus 10 units daily.  9.  Sliding scale insulin a.c.  10. Albuterol 2.5 mg three times a day.  11. Atrovent inhaler three times a day  12. Robaxin 500 mg q.6h. p.r.n.   FAMILY HISTORY:  Mother died of cancer and hypertension at 7.  Father died  of CVA and hypertension at 20.   SOCIAL HISTORY:  She lives in [place] with her husband.  She is a retired  Engineer, civil (consulting).  She is married with three children.  She has never smoked.  She does  not drink alcohol or use drugs and does not routinely exercise.   REVIEW OF SYSTEMS:  Positive for fevers, chest pain, shortness of breath,  dyspnea on exertion, orthopnea, anuria, diabetes, mild nausea, and left knee  pain. All other symptoms are reviewed and negative.   PHYSICAL EXAMINATION:  VITAL SIGNS:  Currently her blood pressure is 81/27,  heart rate 106, respirations 22, temperature 98.1.  Pulse oximetry 95% on 2  L oxygen.  GENERAL:  Pleasant white female in no acute distress. Awake, alert and  oriented x3.  NECK:  Normal carotid upstrokes.  She is obese and difficult to judge JVP.  No bruits.  LUNGS:  Respirations regular and unlabored with diminished breath  sounds in  the right base.  Clear to auscultation on the left.  CARDIAC:  Regular S1 and S2. No S3 or S4 or murmurs.  She had distant heart  sounds.  ABDOMEN:  Obese, soft, nontender, nondistended.  Bowel sounds present x4.  EXTREMITIES:  Warm, dry and pink.  No clubbing, cyanosis or edema..  Dorsalis pedis and posterior tibial pulses 2+ and equal bilaterally.  Chest  x-ray on November 1 showed mild atelectasis of the right base.   LABORATORY DATA:  Her EKG shows sinus tachycardia with a rate of 107, poor R  wave progression.  Her labs showed hemoglobin 9.7, hematocrit 28.5, WBC  22.9, platelets 518.  Sodium 126, potassium 5.4, chloride 93, CO2 21, BUN  33, creatinine 4.4, glucose 285, total bilirubin 0.6.  CK 5406, MB 106.7,  index 2, troponin-I 0.04.  INR 1.8, PT 21.  Calcium 6.8.   ASSESSMENT/PLAN:  1.  Chest pain with abnormal cardiac markers with elevation in cardiac      enzymes.  Unlikely to be cardiac with index of 2 and normal troponin.      Will continue to cycle cardiac markers.  The patient will likely benefit      from outpatient functional study given it has been three years since her      last one and she has had some chest pain.  We will follow up on the 2-D      echocardiogram that has been ordered.  2.  Acute renal failure.  Management per internal medicine and renal.  The      patient has been transferred to step-down.  Will have  a central venous      catheter placed with determination of central venous pressure to better      evaluate the patient's volume status.  3.  Lipid status currently unknown.  Given history of diabetes, the patient      should probably be on statin therapy.  Consider adding one once acute      issues have resolved.  4.  Type 2 diabetes mellitus per primary team.  5.  Hyponatremia/hyperkalemia.  Management per renal. 6.  Leukocytosis with  white counts 22.9.  She is otherwise asymptomatic.      Management per primary team and internal  medicine.      Ok Anis, NP     CRB/MEDQ  D:  11/29/2004  T:  11/29/2004  Job:  [id number]

## 2010-06-16 NOTE — Assessment & Plan Note (Signed)
[facility] [place] [facility]                                 ON-CALL NOTE   NAME:[name], Paula ACKROYD                        MRN:          [id number]  DATE:04/20/2006                            DOB:          08-30-1945    ON CALL NOTE: April 20, 2006 at 10:59 a.m.   The caller is [name], telephone [phone].   She sees Dr. Cato Mulligan.   The patient saw Dr. Cato Mulligan on Tuesday of this week and was diagnosed  with shingles. She was given a prescription for Oxycodone to take at  night and during the day to use Lidoderm patches. She continues to be in  a lot of pain and says the patches do not help her during the day  although the Oxycodone does help at night. She is asking that I call in  something stronger for pain. She states that she is ALLERGIC TO ALL  ANTIINFLAMMATORY MEDICATIONS.  My response is, no, I cannot call in a  narcotic over the phone. I suggested she use the Oxycodone both during  the day and during the night until she can speak with Dr. Cato Mulligan next  week.     Tera Mater. Clent Ridges, MD  Electronically Signed    SAF/MedQ  DD: 04/20/2006  DT: 04/20/2006  Job #: [id number]   cc:   Valetta Mole. Swords, MD

## 2010-06-16 NOTE — Assessment & Plan Note (Signed)
Fair control Needs to lose weight

## 2010-06-16 NOTE — Assessment & Plan Note (Signed)
Terrible control. She desperately needs to lose weight She needs to follow a low calorie diet (1200 Cal or less)

## 2010-06-16 NOTE — H&P (Signed)
NAMECAROLJEAN, MONSIVAIS                 ACCOUNT NO.:  [id number]   MEDICAL RECORD NO.:  [id number]          PATIENT TYPE:  INP   LOCATION:  NA                           FACILITY:  [facility]   PHYSICIAN:  Ollen Gross, M.D.    DATE OF BIRTH:  01-Nov-1945   DATE OF ADMISSION:  11/27/2004  DATE OF DISCHARGE:                                HISTORY & PHYSICAL   DATE OF OFFICE VISIT HISTORY AND PHYSICAL:  November 21, 2004.   CHIEF COMPLAINT:  Left knee pain.   HISTORY OF PRESENT ILLNESS:  The patient is a 65 year old female seen by Dr.  Lequita Halt for ongoing left knee pain.  She has known arthritis.  She has pain  in both knees, but the left is more symptomatic than the right.  She has  undergone previous arthroscopies by Elana Alm. Thurston Hole, M.D. and has been told  she had knee arthritis and would need knee replacements.  She follows up  with Dr. Lequita Halt, seen in the office and noted to have bone on bone in the  medial patellofemoral region.  It is felt that she would be a candidate for  surgery.  Risks and benefits were discussed.  The patient accepts and is  going to be admitted to the hospital.   ALLERGIES:  IVP DYES, MULTIPLE NSAIDS, ADVIL cause edema, DILAUDID did cause  some over sedation and drop in her oxygen level.   CURRENT MEDICATIONS:  1.  Stool softener daily.  2.  Aspirin stopped before surgery.  3.  Hydrochlorothiazide p.r.n.  4.  Multivitamin stopped prior to surgery.  5.  Lisinopril 20/25 daily.  6.  Paxil 10 mg daily.  7.  Glucophage 500 mg b.i.d.  8.  Protonix 20 mg daily.  9.  Biotin.  10. Tylenol.   PAST MEDICAL HISTORY:  Hypertension, non-insulin dependent diabetes  mellitus, reflux, cholelithiasis, history of acute renal failure in a  postoperative period, remote history of asthma.   PAST SURGICAL HISTORY:  A&P bladder repair with sling, EGD, laparoscopy band  surgery 2 years ago, total abdominal hysterectomy, and bilateral knee  arthroscopies.   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  Denies use of  tobacco products and alcohol products.  Retired.   FAMILY HISTORY:  Mother deceased at age 35 with ovarian cancer and  hypertension.  Father deceased at age 61 with stroke and hypertension.   REVIEW OF SYSTEMS:  GENERAL:  No fevers, chills, or night sweats.  NEUROLOGY:  No seizures, syncope, or paralysis.  RESPIRATORY:  No small  bowel obstruction, productive cough, or hemoptysis.  CARDIOVASCULAR:  No  chest pain, angina, or orthopnea.  GASTROINTESTINAL:  No nausea, vomiting,  diarrhea, or constipation.  GENITOURINARY:  No dysuria, hematuria, or  discharge.  MUSCULOSKELETAL:  Left knee.   PHYSICAL EXAMINATION:  VITAL SIGNS:  Pulse 72, respirations 14, blood  pressure 158/82.  GENERAL:  A 66 year old white female short in stature, well-developed, well-  nourished, overweight, in no acute distress.  She is alert, oriented, and  cooperative.  She is an excellent historian.  HEENT:  Normocephalic and atraumatic.  Pupils equal, round, and reactive to  light.  Oropharynx clear.  EOM's intact.  NECK:  Supple, no carotid bruits.  CHEST:  Clear anterior and posterior chest walls.  No rhonchi, rales, or  wheezing.  HEART:  Regular rate and rhythm with no murmur.  ABDOMEN:  Soft, round, protuberant abdomen.  Bowel sounds are present.  RECTAL:  BREASTS:  GENITOURINARY:  Not done, not pertinent to present  illness.  EXTREMITIES:  Left knee shows range of motion from 5 to 110 degrees, minor  crepitus, tender more medial than lateral, no instability.   IMPRESSION:  1.  Osteoarthritis, left knee.  2.  Hypertension.  3.  Non-insulin dependent diabetes mellitus.  4.  Reflux disease.  5.  Cholelithiasis.  6.  Past history of acute renal failure in postoperative period.  7.  Remote history of asthma.   PLAN:  The patient will be admitted to [facility] to  undergo a left total knee arthroplasty.  Surgery will be performed  by Ollen Gross, M.D.  Please note due to the laparoscopy band procedures, it is  recommended that the patient not have any NG tubes nor MRI due to the middle  portion of the laparoscopy band from a previous surgery.      Alexzandrew L. Julien Girt, P.A.      Ollen Gross, M.D.  Electronically Signed    ALP/MEDQ  D:  11/26/2004  T:  11/27/2004  Job:  [id number]   cc:   Valetta Mole. Swords, M.D. [facility]  [name] [place]  [place] [address]

## 2010-06-16 NOTE — Assessment & Plan Note (Signed)
Stable   No need for treatment

## 2010-06-16 NOTE — H&P (Signed)
[name], [name]                 ACCOUNT NO.:  [id number]   MEDICAL RECORD NO.:  [id number]          PATIENT TYPE:  EMS   LOCATION:  ED                           FACILITY:  [facility]   PHYSICIAN:  Sean A. Everardo All, M.D. [facility] OF BIRTH:  1945/12/23   DATE OF ADMISSION:  10/07/2004  DATE OF DISCHARGE:                                HISTORY & PHYSICAL   REASON FOR ADMISSION:  Chest pain.   HISTORY OF PRESENT ILLNESS:  A 66 year old woman with several weeks of  intermittent chest pain which is not necessarily related to exertion. She  feels that the pain may be worse in the context of taking a deep breath.  Today, it was severe with radiation to the shoulder and jaw. She also  associated severe dizziness, shortness of breath, and diaphoresis with her  episode today.   In the emergency department, she was given intravenous nitroglycerin which  she feels, along with the morphine, has resolved her chest pain.   PAST MEDICAL HISTORY:  No history of heart disease but she does have  hypertension and diabetes.   MEDICATIONS:  1.  Zestoretic 20/25 mg 1 daily.  2.  Glucophage 500 mg b.i.d.   SOCIAL HISTORY:  She is married. Her husband is at the bedside. The patient  is a retired Designer, jewellery.   FAMILY HISTORY:  Father died of heart disease at 81.   REVIEW OF SYSTEMS:  She has a minimal cough but she denies the following;  nausea, vomiting, loss of consciousness, dysuria, hematuria, rectal  bleeding, weight loss, sore throat, ear pain, skin rash, and abdominal pain.   PHYSICAL EXAMINATION:  VITAL SIGNS:  Blood pressure is 147/68, heart rate is  82, respiratory rate is 20. The patient is afebrile.  GENERAL:  No distress.  SKIN:  Not diaphoretic. I do not see a rash.  HEENT:  No proptosis. No periorbital swelling. No sclerae swelling. No  scleral icterus. Pharynx no erythema.  NECK:  Supple.  CHEST:  Clear to auscultation. It is nontender.  CARDIOVASCULAR:  No JVD. There is trace  bilateral pretibial edema. Regular  rate and rhythm. No murmur. Pedal pulses are intact.  ABDOMEN:  Soft. Minimally tender. It is obese. No hepatosplenomegaly.  BREASTS:  Not done at this time due to the patient's condition.  GYNECOLOGIC:  Not done at this time due to the patient's condition.  RECTAL:  Not done at this time due to the patient's condition.  EXTREMITIES:  No deformities seen.  NEUROLOGICAL:  Alert and oriented. Does not appear anxious nor depressed.  Cranial nerves appear to be intact and she readily moves all fours.   Electrocardiogram shows nonspecific ST and T changes. Initial cardiac  enzymes are negative. D-dimer is normal. CBC remarkable for WBC 11,900. CMET  is normal except for a random glucose of 231.   IMPRESSION:  1.  Chest pain of uncertain etiology.  2.  Type 2 diabetes with uncertain control.  3.  Hypertension which probably has a situational component today.   PLAN:  1.  I am going to admit to  stepdown.  2.  Check cardiac enzymes.  3.  Check hemoglobin A1c.  4.  Finish a set of cardiac enzymes.  5.  Nitroglycerin and morphine as needed for chest pain.  6.  Plan will be to consult cardiology or Cardiolite if she rules out.  7.  Continue the Glucophage and we will give p.r.n. insulin.           ______________________________  Cleophas Dunker. Everardo All, M.D. [facility]     SAE/MEDQ  D:  10/07/2004  T:  10/07/2004  Job:  [id number]   cc:   Valetta Mole. Swords, M.D. [facility]

## 2010-06-16 NOTE — Op Note (Signed)
[name], [name]                 ACCOUNT NO.:  [id number]   MEDICAL RECORD NO.:  [id number]          PATIENT TYPE:  INP   LOCATION:  1619                         FACILITY:  [facility]   PHYSICIAN:  Ollen Gross, M.D.    DATE OF BIRTH:  1945-09-04   DATE OF PROCEDURE:  11/27/2004  DATE OF DISCHARGE:                                 OPERATIVE REPORT   PREOPERATIVE DIAGNOSIS:  Osteoarthritis, left knee.   POSTOPERATIVE DIAGNOSIS:  Osteoarthritis, left knee.   PROCEDURE:  Left total knee arthroplasty.   SURGEON:  Ollen Gross, M.D.   ASSISTANT:  Alexzandrew L. Julien Girt, P.A.   ANESTHESIA:  General with postoperative Marcaine pain pump.   ESTIMATED BLOOD LOSS:  Minimal.   DRAINS:  Hemovac x1.   TOURNIQUET TIME:  60 minutes at 350 mmHg.   COMPLICATIONS:  None.   CONDITION:  Stable to recovery room.   CLINICAL NOTE:  [name] is a 65 year old female with end-stage arthritis of  the left knee with intractable pain.  She has failed nonoperative management  and presents now for total knee arthroplasty.   PROCEDURE IN DETAIL:  After the successful administration of general  anesthetic, the tourniquet was placed high on the left thigh and left lower  extremity prepped and draped in usual sterile fashion. Extremity was wrapped  in Esmarch, knee flexed, tourniquet inflated to 300 mmHg. A midline incision  was made with the 10 blade through the subcutaneous tissue to the level of  the extensor mechanism.  A fresh blade is used to make a medial parapatellar  arthrotomy.  Soft tissue of the proximal medial tibia subperiosteally  elevated the joint line with the knife, and into the semimembranosus bursa  with a Cobb elevator.  Soft tissue of the proximal lateral tibia is elevated  with attention being paid to avoid patellar tendon on the tibial tubercle.  The patella was everted, knee flexed 90 degrees, and ACL and PCL removed.  A  drill was used to create a starting hole and the distal  femur canal  irrigated.  A 5 degree left valgus alignment guide was placed.  Referencing  off the posterior condyles, rotations marked and a block is pinned to remove  10 mm off of the distal femur.  Distal femoral resection is made with an  oscillating saw. Due to the significant nature of the arthritis and the  habitus of the leg, we could not finish the femoral side at this time.  I  subsequently subluxed the tibia forward and removed osteophytes.  The  extramedullary tibial alignment guide was placed referencing proximally at  the medial aspect of the tibial tubercle and distally along the second  metatarsal axis and tibial crest. The block is pinned to remove 10 mm of the  nondeficient lateral side.  Tibial resection is made with an oscillating  saw. We then put the 10 mm spacer block at the knee in full extension and  full extension was achieved with excellent balance.   The femoral sizing blocks were placed, and size 3 is most appropriate.  The  rotations marked  at the epicondylar axis. The anterior and posterior chamfer  cuts were subsequently made through the size 3 cutting block.  We then  placed the 10 mm spacer block at 90 degrees of flexion, and we then  completed the tibial preparation.  Size 2.5 was the most appropriate tibial  component, and the proximal tibia was prepared with the modular drill and  keel punch and femoral preparation is completed with intercondylar cut for a  size 3.   A size 2.5 mobile bearing tibial trial with a size 3 posterior stabilized  femoral trial and a 10 mm posterior stabilized rotating platform insert and  trial were placed.  With the 10,  full extension was achieved with excellent  varus and valgus balance throughout full range of motion.  Patella was then  everted, thickness measured 22 mm.  Freehand resection is taken to 13 mm, 38  template is placed,lug holes are drilled, rial patella is placed and it  tracks normally. The osteophytes  were removed from the posterior femur with  the trial in place.  We basically had to let the tourniquet down and rewrap  the Esmarch.  I then reinflated the tourniquet 350 mmHg at which point we  had much better controlled.  Any cut bone surfaces were prepared with  pulsatile lavage.  Cement was mixed and once ready for implantation, the  size 2.5 mobile bearing tibial tray, size 3 posterior stabilized femur and  38 patella are cemented into place, and the patella was held the clamp.  Trial 10 mm inserts were placed and the knee was held in full extension and  all extruded cement removed.  Once the cement was fully hardened, the  permanent 10 mm posterior stabilized rotating platform insert is placed into  the tibial tray.  The wound was copiously irrigated with saline solution,  and the extensor mechanism closed over Hemovac drain with interrupted #1  PDS.  Flexion against gravity.  The tourniquet was loosened at a total time  of approximately 60 minutes.  The subcutaneous was closed with interrupted 2-  0 Vicryl subcuticular and running 4-0 Monocryl.  The catheter for the  Marcaine pain pump was placed, and the pump was initiated.  Steri-Strips and  a bulky sterile dressing applied.  Hemovac was hooked to suction, and she is  placed into a knee immobilizer, awakened and transferred to recovery in  stable condition.      Ollen Gross, M.D.  Electronically Signed     FA/MEDQ  D:  11/27/2004  T:  11/28/2004  Job:  [id number]

## 2010-06-16 NOTE — Discharge Summary (Signed)
NAMEHALONA, AMSTUTZ                 ACCOUNT NO.:  [id number]   MEDICAL RECORD NO.:  [id number]          PATIENT TYPE:  INP   LOCATION:  5034                         FACILITY:  [facility]   PHYSICIAN:  Alexzandrew L. Perkins, P.A.DATE OF BIRTH:  Aug 05, 1945   DATE OF ADMISSION:  11/27/2004  DATE OF DISCHARGE:  12/12/2004                                 DISCHARGE SUMMARY   ADMITTING DIAGNOSES:  1.  Osteoarthritis of left knee.  2.  Hypertension.  3.  Non-insulin-dependent diabetes mellitus.  4.  Reflux disease.  5.  Cholelithiasis.  6.  Past history of acute renal failure postoperative.  7.  Remote history of asthma.   DISCHARGE DIAGNOSES:  1.  Osteoarthritis of left knee status post left total knee arthroplasty.  2.  Postoperative hypotension.  3.  Postoperative acute renal failure.  4.  Postoperative shock resulting in complicated acute renal failure.  5.  Postoperative chest pain.  6.  Metabolic acidosis.  7.  Acute tubular necrosis secondary to hypotension and shock.  8.  Postoperative leukocytosis.  9.  Postoperative hyponatremia.  10. Postoperative hypokalemia.  11. Postoperative hypoxia.  12. Hypertension.  13. Non-insulin-dependent diabetes mellitus.  14. Reflux disease.  15. Cholelithiasis.  16. Asthma.  17. Postoperative pneumonia.   PROCEDURE:  Date of surgery November 27, 2004, left total knee surgery by Dr.  Lequita Halt and assistant Julien Girt [facility].   ANESTHESIA:  General.   Postop Marcaine pain pump.   TOURNIQUET TIME:  60 minutes at 350 mmHg.   BRIEF HISTORY:  Marcayla is a 65 year old female with end stage arthritis and  left knee with intractable pain. She has failed operative management over a  preoperative period and now presents for total knee arthroplasty.   CONSULTS:  Internal medicine, hospitalists Dr. Rene Paci. Renal  services Dr. Eliott Nine. [facility], Dr. Myrtis Ser. Also pulmonary critical  care medicine, Dr. Delford Field and Dr. Shelle Iron. [facility] rehab  services.   LABORATORY DATA:  Laboratory data section for [facility] stay. Multiple  blood gases. First one November 29, 2004 pH of 7.187, pCO2 of 46.0, pO2 of  149, bicarb 16.8, total CO2 of 16.6. Follow up blood gas on November 29, 2004  pH down to 7.154, pCO2 down to 45.5, pO2 179, bicarb 15.1, total CO2 15.  Follow up on November 30, 2004 pH of 7.338, pCO2 of 44.2, pO2 of 74.0, bicarb  of 23.1, total CO2 of 22. Carboxyhemoglobin was followed, 5 levels starting  at 0.8. It went as high as 0.9. Methemoglobin also 5 checks, 0.8, got as  high as 1.1 and came down to 0.7. CBC preop hemoglobin 13.3, hematocrit of  39.5, white cell count 9.3, red cell count 4.39. Differential was all within  normal limits. Serial CBCs were followed. Postop hemoglobin 11.9 and 34.7.  Hemoglobin drifted at 10.1 on November 29, 2004, drifted down to 8.9 and 26.2  later that evening. On the following day of November 30, 2004 hemoglobin got  as low as 8.7 and 25.1. Given blood. Post transfusion hemoglobin back up to  9.2. Last noted H and H on December 01, 2004 hemoglobin was 9.4, hematocrit  of 26.7. PT and PTT on admission were 13.7 and 29 respectively. INR 1.0.  Serial pro time is followed. By November 29, 2004 PT/INR came up to 21.0 and  1.8 respectively. By December 01, 2004 PT up to 29.4, INR was 2.4. There was  a D-dimer taken on November 30, 2004 which was high at 1.60. Chem panel on  admission sodium low at 133, chloride low at 95, glucose elevated at 225,  albumin low at 3.3. AST elevated at 54, ALT elevated at 52. Remaining chem  panel within normal limits. Serial BMETs were followed. Sodium dropped from  133 down to 126 noted on November 29, 2004. By November 30, 2004 it was back  up to 130. Potassium went up from 4.7 to 5.5 noted on November 29, 2004, back  down to 4.1 noted on November 30, 2004. Glucose came down to 109. BUN started  out at 14 and went up to 40 by November 30, 2004. Creatinine started at 0.9,   went up to 4.4 and got as high as 5.1 on November 30, 2004 and back down to  4.9 at a draw later that day and went back up to 5.3 on December 01, 2004 and  back down to 5.1. Cardiac enzymes taken on November 29, 2004. First set CK  elevated at 5406, CK-MB elevated at 106.7, index normal 2.0, troponin 0.04.  Second set Ck elevated 6017, CK-MB still elevated at 102.2, relative index  normal at 1.7, troponin up a little bit at 0.05. Third set CK 6941, CK-MB  92.8, relative index 1.3, troponin 0.07. Fourth set on the next day of  November 30, 2004 showed CK of 9002, CK-MB down to 81.8, relative index still  normal at 0.9, troponin up a little bit at 0.16. B-type natriuretic peptide  taken December 01, 2004 was 262.0. TSH level taken on November 29, 2004 at  2.253. Anemia taken December 01, 2004 showed iron low at 16, TIBC low at 217,  percent sat 7. Cortisol level on December 01, 2004 taken at 30.6. A vanc  trough on December 01, 2004 was 16.8. A urine creatinine on November 29, 2004  showed a level of 86.8. Urine sodium on November 29, 2004 showed a level of  81. Preop UA negative except cloudy. Follow up UA on November 29, 2004 showed  large hemoglobin, positive protein, large leuk esterase, white cells too  numerous to count, red cells too numerous to count, and many bacteria. Blood  type B positive. Blood cultures taken on November 29, 2004 no growth x2 days.  Sputum evaluation on November 30, 2004 grade 2+. Respiratory culture on  November 30, 2004 normal oropharyngeal flora.   Laboratory data for [facility] stay. CBC on the arrival at [facility]  showed a level of 9.3 and 27.7. White count elevated at 11.5. Differential  showed elevated monos at 12, otherwise normal. Hemoglobin drifted down to  9.0 and came back to 9.6, got as high as 10.2. Last noted H and H noted on  December 10, 2004 was 9.3 and 27.8. Special hematology lab taken December 02, 2004 showed a percent retic at 1.5, RBC of 3.27.  Hematuria retic fraction high at 27.3. Followed with special hematology study followed up on December 09, 2004 showed percent retic up to 3.5, RBC down to 2.96, and immature  retic fraction percent down to 22.7. The PT/INR on arrival at [facility] was  27.0 and 2.5. Serial pro time and INRs were followed throughout  hospital course. Last noted PT/INR 22.1 and 1.9. The BMET on arrival at [facility] the sodium was low at 130. Potassium was normal at 4.3, glucose was  102. BUN was back up to 43. Creatinine had gone back up again to 5.4.  Calcium low at 5.7. Serial BMETs were followed. Sodium dropped down to 130  on December 02, 2004, came back up to 140 and was last noted at 136.  Potassium dropped down to 2.9, noted on December 05, 2004. Gave him potassium  supplements. It came back up and was noted at the last at 3.5. The BUN,  which was elevated again when she came over to [facility], went up as high as 51  noted on December 03, 2004. Came back down to 37, then to 26, then back to a  normal level prior to discharge of 22. The creatinine had gone up to 5.4 on  arrival to [facility]. Got as high as 5.5 on December 02, 2004.  Drifted back down to 4.7, then down to 2.4. Drifted and came back down to  near preop level of 1.9. Calcium low, got as low as 6.0 but then came back  up to 8.4. CK isolated level December 02, 2004 was high at 4246. Follow up  anemia studies on December 02, 2004 showed an iron level of 16, TIBC of 228,  percent sat of 7, B12 level at 612, folate serum level of 17.1, ferritin  level of 250. UIBC was 212. Folate level taken on December 02, 2004 17.1.  Vanc trough taken December 06, 2004 was 19.8. Sputum evaluation December 04, 2004 grade 1+. Respiratory culture on December 04, 2004 showed normal  oropharyngeal flora.   Venous Doppler studies taken on November 30, 2004 technically difficult study  due to patient's body habitus. No obvious evidence of DVT,  superficial  thrombosis, or Baker cyst bilaterally.   Transthoracic echo taken on November 29, 2004. These were limited by  restricted patient mobility. Overall left ventricular systolic function was  normal. Left ventricular traction function was estimated range between 60-  65%. Ratio of early left ventricular filling to atrial contraction was  within normal range. Trivial mitral valvular regurgitation, right  ventricular systolic function normal. Estimated peak pulmonary arterial  systolic pressure was mildly to moderately increased. Mild tricuspid  valvular regurg. No pericardial effusion. EKG dated November 29, 2004 sinus  tachycardia, low voltage QRS, poor R wave progression, cannot rule out  anterior infarct age indeterminate. No significant change since the last  tracing or by __________. Portable chest x-ray taken December 02, 2004,  improved bibasilar atelectasis, persistent pulmonary vascular congestion.  Portable chest on December 04, 2004 bilateral lower lobe infiltrates, right worse than left, consistent with pneumonia. Follow up chest on December 05, 2004 compared with patient's exam from yesterday there is improved aeration  of lung bases, residual patchy atelectasis and infiltrate to left lower lobe  with small left pleural effusion. TP chest on December 06, 2004 improved  aeration to left lung base.   HOSPITAL COURSE:  The patient was admitted to [facility] on  November 27, 2004, taken to the operating room and underwent the above stated  procedure without complication. Patient tolerated the procedure well. Later  transferred to the recovery room and then to the orthopedic floor for  continued postop care. Started on PC and __________ analgesics  postoperatively. On day  1 the patient was doing fairly well. Been seen in  rounds by Dr. Lequita Halt but was noted to have a significant increase in her  creatinine from 0.9 preoperatively up to 2.4. Felt to be instant renal   insufficiency or early failure. She was given fluids. [place] [facility] hospitalist consult was called to Dr. Rene Paci. She was  seen by Dr. Felicity Coyer for the renal insufficiency, treated with aggressive IV  fluids. Also assisted with management of her diabetes and other issues.  Unfortunately she developed some chest pain late that evening. Due to her  low output and renal insufficiency the metformin and lisinopril was held.  The patient underwent a cardiology consult by Dr. Myrtis Ser with [facility]  Cardiology. Cardiac enzymes were ordered. She was noted to have some  abnormal cardiac markers with elevated CK and elevated CK-MB. Serial cardiac  enzymes were followed along with EKG monitoring. Unfortunately by the next  day her creatinine level had increased from a level of 2.4 up to a high  level of 4.4. She was noted to be getting into some progressive renal  failure. She was noted to start to develop some congestion and coughing. She  was treated with Lasix. She did have some low urinary output on day 1;  however, her urinary output had stopped completely and she had no urinary  output on the morning of day 2. Due to the elevated CK and MBs and EKGs she  was transferred to step down. She was started on Dopamine and later started  on levodopa. She was treated with aggressive fluids. The ACE inhibitors and  Glucophage had been held. She was sent to ICU and had a central line  placement. Renal consult was called. The patient was seen in consultation by  Dr. Eliott Nine from renal services. Agree with the ICU and PICC line for CVP  monitoring. Treated with aggressive fluids and also Lasix. Further labs were  ordered for renal services. Blood gases and cardiac enzymes were followed  very closely from a medical and cardiology standpoint. A 2-D echo was  ordered as above. It was felt that the elevated enzymes was not likely to  represent any type of cardiac event but cardiology followed  very closely.  Pulmonary critical care consult was called. Central venous catheter was placed. The patient was seen in consultation by Dr. Shelle Iron and Dr. Danise Mina. On day 2 due to her significant situation the patient started on  vancomycin and cefepime empirically. It was decided that the patient should  be transferred over to [facility] to a [facility] ICU bed for further study and  close monitoring as her condition was quite guarded at this point. By day 3  her creatinine increased further up to 5.1 indicating some acute tubular  necrosis. She was trying to be weaned off the Dopamine. She was in metabolic  acidosis noted by medical services. She was given IV bicarb. The echo had  been performed and it did show that she had normal left ventricular function  with an ejection fraction noted about 60-65%. It was felt that her elevated  enzymes were not cardiac related. She was given blood but felt to be in some  noncardiogenic shock. She was treated with IV pressors, aggressive fluids,  IV bicarb, and fortunately her urinary output started to pick up a little  bit per renal services. By day 4 she had been moved over to [facility] ICU for  further close monitoring and to consider  even possible dialysis if needed  due to the acute renal failure. It was felt that the cardiac enzymes were  more related to the renal failure and hypotension.  Fortunately again the urinary output has started to slowly improve on day 4.  It was noted on day 5 that the creatinine had come down from a level of 5.1  down to 4.7 as her urinary output improved. She was monitored in the unit  for several days and was later transferred out of the unit to the floor  where her PT and OT was resumed along with improved renal function.  Unfortunately on the early morning hours of day 7, about shortly after  midnight, the rapid response team had to be called because of some  difficulty breathing and wheezing. Chest x-ray was ordered  which proved  positive for bilateral infiltrates, indicative more of pneumonia. It was  felt that she had a right lower lobe pneumonia. She was being covered with  antibiotics. She had been treated aggressively by the pulmonary critical  care medicine staff. She had been treated with BiPAP. Unfortunately she had  been off of it for a couple of days and the BiPAP was resumed. She was also  noted to have a little bit of some vocal cord dysfunction, therefore speech  pathology was consulted postop to assist with speech and the vocal cord  dysfunction. She is very slow to progress due to the multiple medical issues  that have been ongoing for a week. Therefore rehab services were consulted.  [facility] rehab service was consulted to see if she would require inpatient rehab  facilities postoperatively. The following day on day 8 postop she was using  oxygen mask and breathing much easier. She had been treated aggressively  with again continued IV fluids. She had been on antibiotic coverage for the  pneumonia and her creatinine had continued to improve along with improved BUN levels. The creatinine was down to 2.7. She continued to slowly progress  with therapy. She was doing a little better. By day 9 her creatinine was  down again to 2.4. It was noted her potassium had dropped, which was felt to  be due to the Lasix and diuresis, down to 3.2. She was given some  supplementation. She had been resumed on the BiPAP. She did not tolerate  that at night although overall her oxygen levels had improved. On day 10 she  was noted to have some ankle pain which was evaluated by Dr. Lequita Halt but she  was nontender about the ankle and had full range of motion and it was felt  to be a transient problem with her ankle which later did not impede her  therapy. She started getting up a little bit more with therapy and she  actually ambulated 200 feet later that day. From a renal standpoint she had  much better fluid,  urinary output, good diuresis. She was improving each  day. Her sugars were running a little high and the insulins were changed.  She had been off of the metformin. It was actually recommended that she not  go back on the metformin and that she follow up on an outpatient basis for  the medications concerning diabetes. By day 12 she was improving with her  therapy. She was actually getting to a point where it was felt that she  would not need inpatient rehab therefore discharge planning started to make  arrangements for home care. She continued her therapy, continued the  Lasix.  Bumex was added for the extra weight and fluid, which was felt to be spaced  from all the aggressive fluid hydration in the beginning of her hospital  stay. By day 13 the previous insulin changes had helped with improved CBG  levels. She was much stable from a pulmonary standpoint. Her leukocytosis,  which was noted earlier in the hospitalization, had resolved. Her white  count was back down. Most of this was felt to be due to obviously stress  from her multiple medical issues. She continued to improve. Creatinine was  down to 1.9, getting closer to more of a preoperative level. By day 14 the  Maxipime and the vancomycin, which she had been receiving for 12 days, was  discontinued. It was noted that she had excellent urinary output. She was  placed on Lantus instead of metformin. Still had a little bit of hypokalemia  so she continued with supplements. It was noted that she would not need any  type of __________ at home. Her O2 sats had improved. She had been weaned  off her oxygen. She was getting up and ambulating very well with her  physical therapy. Renal services signed off due to the improved return to  normal renal function. Pulmonary critical care had signed off earlier once  her O2 sats had improved. Cardiology had signed off earlier in the hospital  course when it was felt to be there were no cardiac events  associated with  the elevated CK, isoenzymes. She had improved from a medical standpoint.  Progressing from an orthopedic standpoint. She was arranged for discharge on the following day. She was seen in rounds on December 12, 2004 by Dr.  Lequita Halt. Arrangements were made and she was discharged home.   DISCHARGE PLAN:  Patient discharged home on December 12, 2004.   DISCHARGE DIAGNOSIS:  Please see above.   DISCHARGE MEDICATIONS:  1.  Coumadin for DVT prophylaxis.  2.  Ventolin nebulizers.  3.  Percocet.  4.  Robaxin.  5.  Xanax.  6.  Lantus insulin 25 units subcu every 24 hours at bedtime.   FOLLOW UP:  The patient is to follow up with Dr. Lequita Halt 2 weeks after  surgery. Call for an appointment at [phone]. She is to follow up with Dr.  Cato Mulligan, her primary care, on December 18, 2004 at 9:15 in the morning.  Contact their office to confirm. She is also noted to call Dr. Cato Mulligan for  blood sugars less than 80 or greater than 300 and she was to remain off her  Glucophage.   DIET:  Cardiac, diabetic diet.   ACTIVITY:  She is weightbearing as tolerated to the left lower extremity.  Gait training ambulation and ADLs as per home PT. Home nursing for continued  Coumadin monitoring.   DISPOSITION:  To home.   CONDITION ON DISCHARGE:  Improved.      Alexzandrew L. Julien Girt, P.A.     ALP/MEDQ  D:  03/14/2005  T:  03/15/2005  Job:  [id number]   cc:   Valetta Mole. Swords, M.D. [facility]  [address] [place]  [place] [address]   Ollen Gross, M.D.  Fax: [phone]   [facility] Eliott Nine, M.D.  Fax: [phone]   Rene Paci, M.D. [facility]  [address] [place], [place] [address]   Shan Levans, M.D. [facility]  [address]  [place]  [place] [address]   Marcelyn Bruins, M.D. [facility]  [address] [facility]  [place]  [place] [address]   Willa Rough, M.D.  [address]  [place]  [place] [address]

## 2010-06-16 NOTE — Op Note (Signed)
NAMEJENTRY, [name]                 ACCOUNT NO.:  [id number]   MEDICAL RECORD NO.:  [id number]          PATIENT TYPE:  INP   LOCATION:  NA                           FACILITY[facility]  [facility]   PHYSICIAN:  Bertram Millard. Dahlstedt, M.D.DATE OF BIRTH:  26-Jun-1945   DATE OF PROCEDURE:  05/31/2004  DATE OF DISCHARGE:                                 OPERATIVE REPORT   PREOPERATIVE DIAGNOSIS:  Stress urinary incontinence.   POSTOPERATIVE DIAGNOSIS:  Stress urinary incontinence.   PRINCIPAL PROCEDURE:  SPARC pubovaginal sling.   SURGEON:  Bertram Millard. Dahlstedt, M.D.   ANESTHESIA:  General endotracheal.   COMPLICATIONS:  None.   BRIEF HISTORY:  A 65 year old female who presents at this time for anterior  and posterior repair, pubovaginal sling. The patient was originally seen by  me just over a month ago. She had a planned A and P repair at that time. She  had significant urinary incontinence. She does not have any symptoms of  bladder instability. Evaluation at the office here revealed the patient had  a fairly open bladder neck with a cough, and a normal-appearing bladder. She  wears six to seven pads per day. The patient has requested surgical  management of her stress incontinence. She was counseled regarding the  procedure, as well as risks and complications. These include but are not  limited to infection, bleeding, perforation of bladder, erosion of the  sling, retention, among others. She desires to proceed.   DESCRIPTION OF PROCEDURE:  The patient was administered a general  endotracheal anesthetic, placed in the dorsal lithotomy position. Bladder  was catheterized. Dr. Richardean Sale commenced with an anterior repair and  left the anterior vaginal wall open. I then presented and examined the  patient. The urethra was easily palpable with the catheter in place. I then  perforated the endopelvic fascia bilaterally just underneath the pubic bone  anteriorly. I then passed two retropubic  needles through puncture sites just  above the pubic symphysis on each side of the midline. Once these were  passed through the small vaginal incisions, cystoscopy revealed an intact  bladder and urethra. The needles were not seen. The ends of the nylon mesh  were then grasped and the sling was then pulled up so that it cradled the  mid urethra. The sling was positioned so that there was approximately a  centimeter and a half of slack  underneath the urethra. The nylon sleeves were then removed. At this point  the sling was then again examined and found to be cradling the urethra  nicely without significant tightness. The mesh was cut right underneath the  skin bilaterally. Dr. Richardean Sale then commenced with the remainder of  the procedure.      SMD/MEDQ  D:  05/31/2004  T:  05/31/2004  Job:  [id number]   cc:   Richardean Sale, M.D.  [address]  [place]  [place] [address]  Fax: [phone]   Valetta Mole. Swords, M.D. [facility]

## 2010-06-16 NOTE — Assessment & Plan Note (Signed)
Controlled Continue meds 

## 2010-06-16 NOTE — Discharge Summary (Signed)
[name], [name]                 ACCOUNT NO.:  [id number]   MEDICAL RECORD NO.:  [id number]          PATIENT TYPE:  INP   LOCATION:  0155                         FACILITY:  [facility]   PHYSICIAN:  Rene Paci, M.D. LHCDATE OF BIRTH:  April 05, 1945   DATE OF ADMISSION:  10/07/2004  DATE OF DISCHARGE:  10/11/2004                                 DISCHARGE SUMMARY   </DISCHARGE DIAGNOSES>   1.  Atypical chest pain/abdominal pain, rule out myocardial infarction      negative, with negative gastrointestinal evaluation, see next.  2.  Fatty liver or with abnormal LFTs.  3.  Minimally dilated common bile duct with negative endoscopic ultrasound.      No evidence of obstructive stones or mass.  4.  Morbid obesity, status post Lap-Band [id number] at [facility]  Band      let down done by Dr. Irving Shows for outpatient follow-up.  5.  Type 2 diabetes.  Suboptimal control.  Hemoglobin A1c this admission  of      9.4.  6.  Hypertension.  7.  Osteoarthritis scheduled for knee replacement, left in coming weeks,      Ollen Gross, M.D.   DISCHARGE MEDICATIONS:  Are as prior to admission and include:  1.  Metformin presumably 500 m p.o. twice daily.  2.  Zestoretic 20/25 p.o. daily.  3.  Multivitamin one daily.  4.  The patient also encouraged to add aspirin 81 mg daily.  5.  Protonix 40 mg daily to her regimen.   HOSPITAL FOLLOW UP:  1.  Will be first with Bruce H. Swords, M.D. [facility], her primary care physician      for further management of all chronic medical issues including tighter      control of diabetes.  2.  With surgery either at [facility] or locally with Dr. Ezzard Standing for      management of her Lap-Band including reinflation and continued diet      control of her obesity, also no contraindications to follow up with      planned knee replacement surgery with Dr. Despina Hick.   CONDITION ON DISCHARGE:  Medically stable, all pain resolved and no nausea,  vomiting or other  symptoms.   TESTS:  1.  Include an abdominal ultrasound done September9 showing fatty liver and      common bile duct at 7.4 mm.  2.  Endoscopic ultrasound by Dr. Christella Hartigan of [facility] on September12.  3.  CT abdomen and pelvis September12 showing only fatty liver, no other      acute changes.   CONSULTATIONS:  1.  Dr. [facility], Rachael Fee, M.D.  2.  [facility], Sandria Bales. Ezzard Standing, M.D. and Adolph Pollack, M.D.   HOSPITAL COURSE BY PROBLEM LIST:  Problem 1.  Atypical chest pain.  The  patient is a overweight, 65 year old woman who came in with sharp stabbing  pain, epigastric to the right rib area causing nausea.  Because of her risk  factors for coronary disease, she was admitted to telemetry for rule out MI.  Her cardiac enzymes  were negative.  The patient reports she had a Cardiolite  in the last 12 months which was negative and symptoms of her pain had  improved overall and seemed more localized with nausea and right upper  quadrant as such there was concern for gallbladder or GI disease and she  underwent ultrasound.  Gallbladder showed no  stones or evidence of  cholecystitis but mild dilation of the common bile duct concerning for a  retained stone.  GI was to be consulted for possible ERCP but as the patient  had a Lap-Band, she was unable to undergo endoscopy or an MRCP without first  being seen by general surgery.  General surgery was contacted and Dr.  Abbey Chatters helped US obtain information regarding her Lap-Band procedure  done on March of 2004 at [facility].  He then deferred to his bariatric  partner, Dr. Ezzard Standing for a Lap-Band let down to be done prior to endoscopic  evaluation which was done on September12 and prior to proceeding with an  ERCP.  Dr. Christella Hartigan of GI performed an endoscopic ultrasound to confirm  question of a mass and no stone could be found.  There was soft tissue  density without features concerning for malignancy likely representing  fat  as the patient had severe fatty infiltration throughout the liver.  To  finish evaluation, the patient underwent a CT abdomen, pelvis which also  confirmed only benign findings and severe fatty liver infiltration.  As the  patient's pain has resolved and there has been no other workup, the patient  is felt stable for discharge home. She will follow up with surgery  preferably at [facility] but locally as she prefers with Dr. Ezzard Standing for  reinflation of her Lap-Band and further management of her obesity.   Problem 2.  Other medical issues. The patient's other medical issues are as  listed above.  No changes have been made in her regimen.  Tighter control of  her diabetes should be pursued and the patient is aware of this.      Rene Paci, M.D. [facility]  Electronically Signed     VL/MEDQ  D:  10/11/2004  T:  10/11/2004  Job:  [id number]   cc:   Valetta Mole. Swords, M.D. [facility]   Rachael Fee, M.D.   Adolph Pollack, M.D.  [address], [address]  [place]  [place] [address]   Ollen Gross, M.D.  Fax: [phone]

## 2010-06-19 ENCOUNTER — Other Ambulatory Visit: Payer: Self-pay | Admitting: Internal Medicine

## 2010-06-23 ENCOUNTER — Other Ambulatory Visit (INDEPENDENT_AMBULATORY_CARE_PROVIDER_SITE_OTHER): Payer: BC Managed Care – PPO

## 2010-06-23 DIAGNOSIS — E119 Type 2 diabetes mellitus without complications: Secondary | ICD-10-CM

## 2010-06-23 LAB — LIPID PANEL
HDL: 37.4 mg/dL — ABNORMAL LOW (ref >39.00)
LDL Cholesterol: 24 mg/dL (ref 0–99)
Total CHOL/HDL Ratio: 2

## 2010-06-23 LAB — HEPATIC FUNCTION PANEL
AST: 36 U/L (ref 0–37)
Alkaline Phosphatase: 83 U/L (ref 39–117)
Total Bilirubin: 0.4 mg/dL (ref 0.3–1.2)

## 2010-06-23 LAB — BASIC METABOLIC PANEL
BUN: 16 mg/dL (ref 6–23)
CO2: 30 mEq/L (ref 19–32)
Calcium: 9.5 mg/dL (ref 8.4–10.5)
GFR: 61.53 mL/min (ref >60.00)
Glucose, Bld: 169 mg/dL — ABNORMAL HIGH (ref 70–99)

## 2010-06-27 ENCOUNTER — Encounter: Payer: Self-pay | Admitting: Internal Medicine

## 2010-06-28 ENCOUNTER — Telehealth: Payer: Self-pay | Admitting: *Deleted

## 2010-06-28 DIAGNOSIS — E1365 Other specified diabetes mellitus with hyperglycemia: Secondary | ICD-10-CM

## 2010-06-28 NOTE — Telephone Encounter (Signed)
Referral to Diabetic Teaching.

## 2010-06-28 NOTE — Progress Notes (Signed)
Quick Note:  Sent referral to DM teaching and pt notified. ______

## 2010-06-29 ENCOUNTER — Other Ambulatory Visit: Payer: Self-pay | Admitting: Internal Medicine

## 2010-07-24 ENCOUNTER — Ambulatory Visit: Payer: BC Managed Care – PPO | Admitting: Endocrinology

## 2010-08-08 IMAGING — CT CT HEAD W/O CM
1 of 2 series · 16 of 30 positions shown, 20 images · non-contrast
Comparison: 03/22/2004.

CLINICAL DATA: Dizziness.  Slurred speech and weakness.

CT HEAD WITHOUT CONTRAST
TECHNIQUE: Contiguous axial images were obtained from the base of
the skull through the vertex without contrast.

[Series 3: recon 2: brain · axial · 0.47mm/px · z∈[+152,+296]mm · 16 of 56 slices shown, 20 images]
[im 3/56  brain]
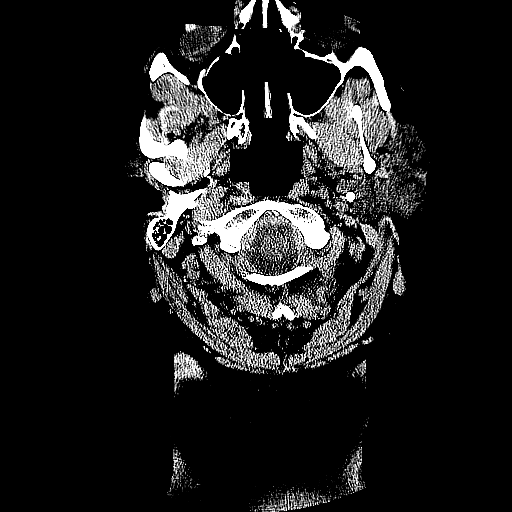
[im 3/56  bone]
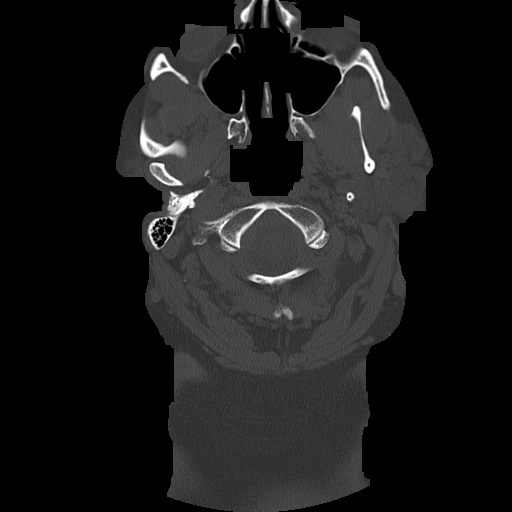
[im 6/56  brain]
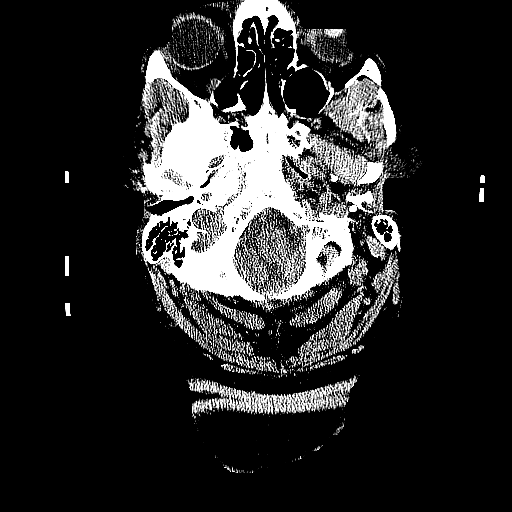
[im 9/56  brain]
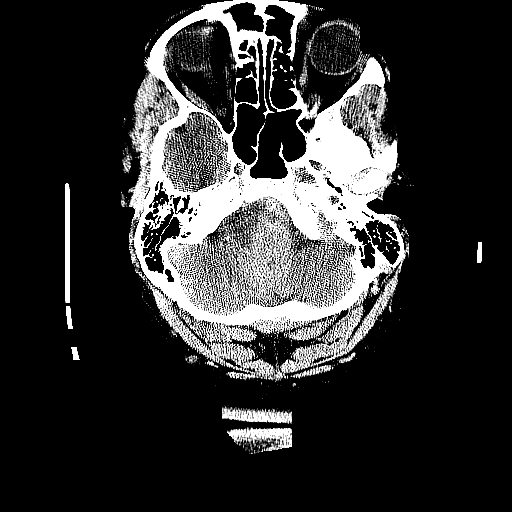
[im 12/56  brain]
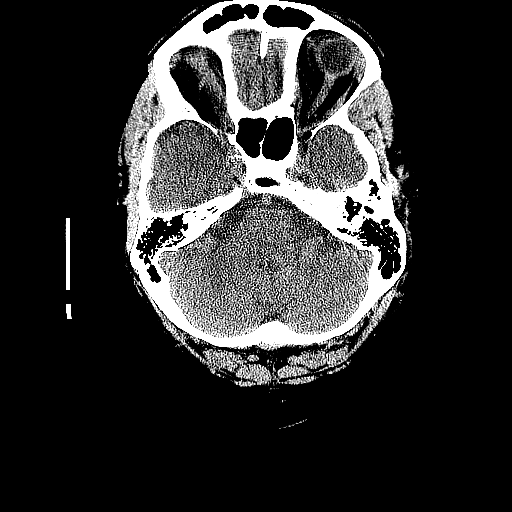
[im 18/56  brain]
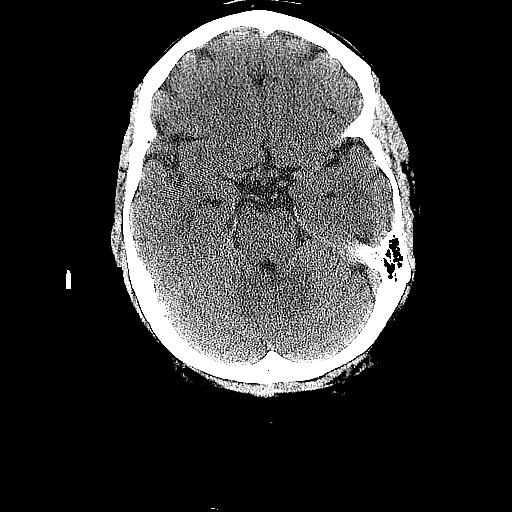
[im 18/56  bone]
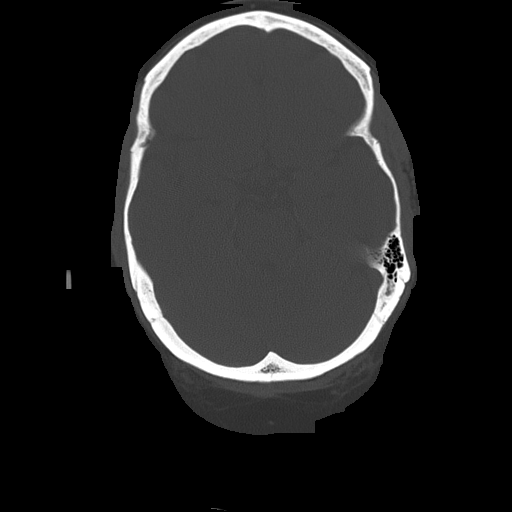
[im 21/56  brain]
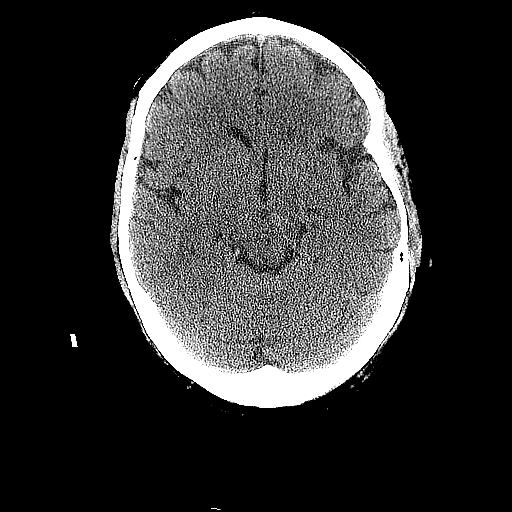
[im 24/56  brain]
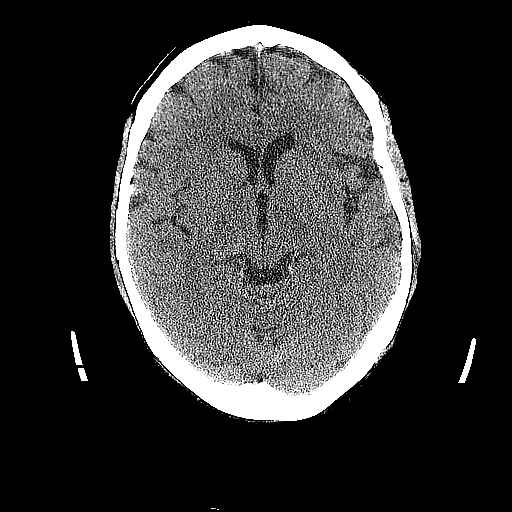
[im 27/56  brain]
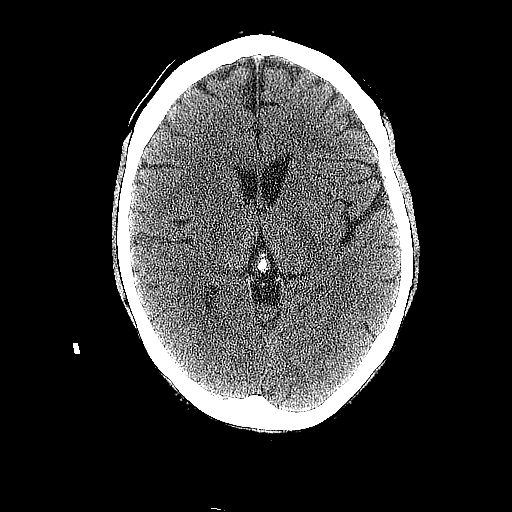
[im 29/56  brain]
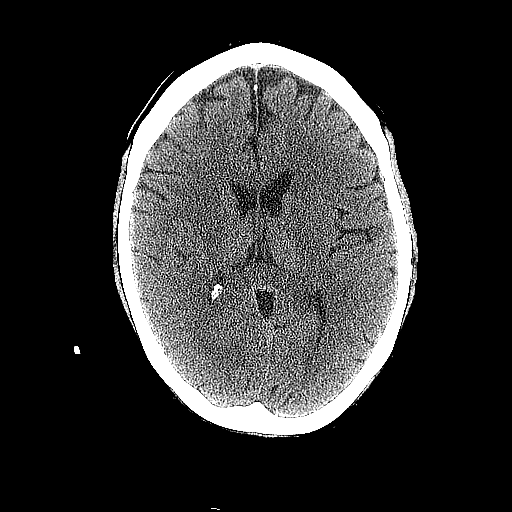
[im 29/56  bone]
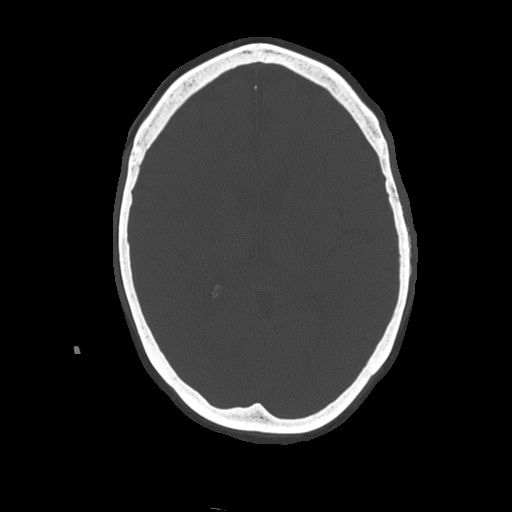
[im 32/56  brain]
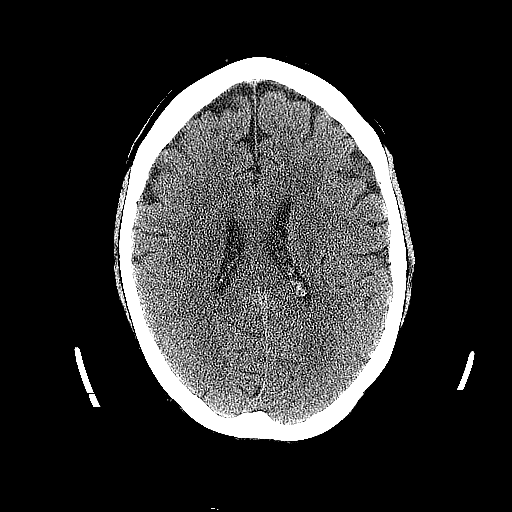
[im 35/56  brain]
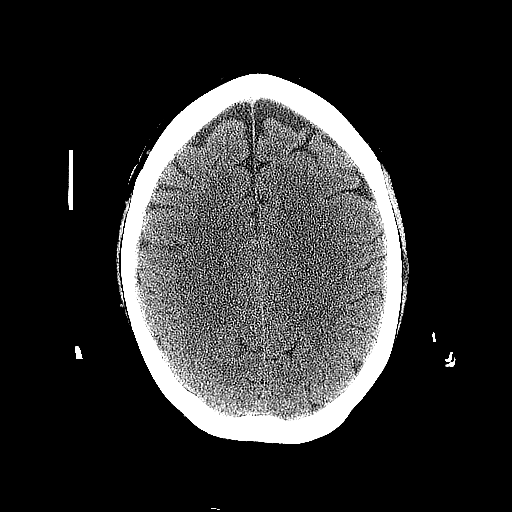
[im 38/56  brain]
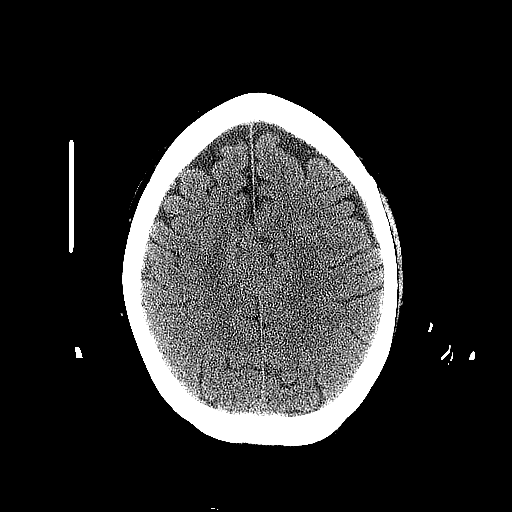
[im 44/56  brain]
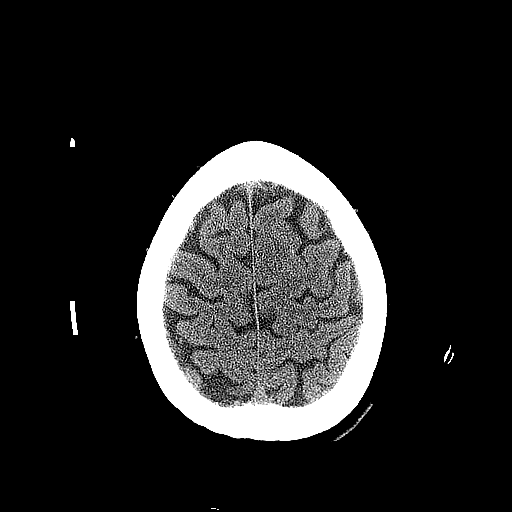
[im 44/56  bone]
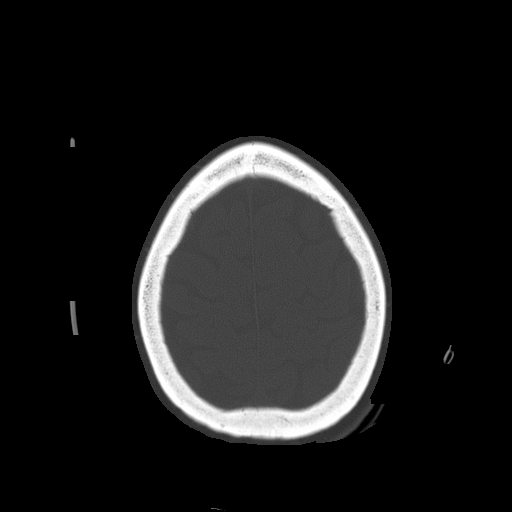
[im 47/56  brain]
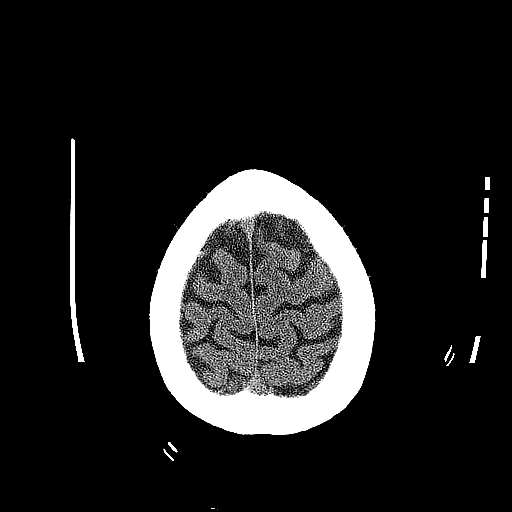
[im 50/56  brain]
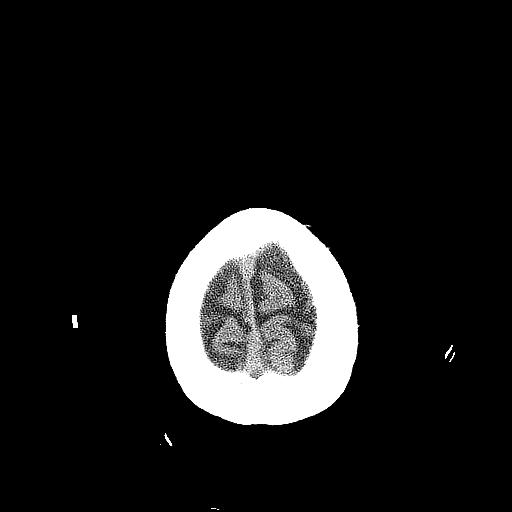
[im 53/56  brain]
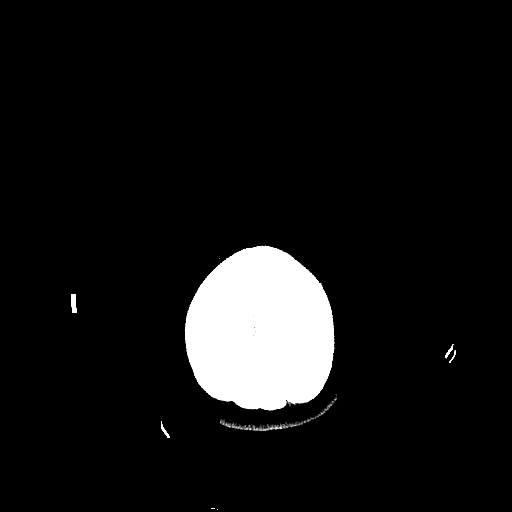

[16 of 30 positions shown; findings below may reference images not displayed]

FINDINGS: Ventricle size is normal.  Small chronic infarct in the
internal capsule anteriorly on the left is noted.  No other white
matter lesions are identified.  There is no acute infarct or mass
lesion.  There is no intracranial hemorrhage.  Negative for skull
lesion.
IMPRESSION: No acute abnormality.

## 2010-09-07 ENCOUNTER — Other Ambulatory Visit: Payer: Self-pay | Admitting: Internal Medicine

## 2010-09-14 ENCOUNTER — Telehealth: Payer: Self-pay | Admitting: Internal Medicine

## 2010-09-14 NOTE — Telephone Encounter (Signed)
[name], this pt was seen by Dr. Cato Mulligan 5/18. At that visit, his plan dictated was:   Return in about 4 months (around 10/17/2010) for labs only---bmet one week and then f/u with me 4 months with all labs.  This doesn't make sense. The pt is scheduled for a f/u on 9/18. Is that supposed to be only labs, no Dr. Cato Mulligan? But then it says see in in 4mos, which is at that time. She called today about labs and I made her a lab appt for 9/11& kept her 9/18 visit with the doc, but I need lab orders as well as clarification of what he wants in terms of seeing this pt. Thanks!

## 2010-09-15 NOTE — Telephone Encounter (Signed)
Pt needs a 4 mth f/u, labs one week prior to OV

## 2010-09-27 ENCOUNTER — Telehealth: Payer: Self-pay | Admitting: Internal Medicine

## 2010-09-27 NOTE — Telephone Encounter (Signed)
Opened in error

## 2010-09-27 NOTE — Telephone Encounter (Signed)
Pt is needing to get a note written stating that her son needs to leave after his last class to avoid the pt having to sit additional time out in car, due to pts medical condition. Pt says that she apologizes having to req this note, but pt was told that this had to be done. Pt needs to pick this note up this week. This is very important.

## 2010-09-29 NOTE — Telephone Encounter (Signed)
Documented in wrong chart

## 2010-10-03 ENCOUNTER — Ambulatory Visit: Payer: BC Managed Care – PPO | Admitting: *Deleted

## 2010-10-09 ENCOUNTER — Telehealth: Payer: Self-pay | Admitting: *Deleted

## 2010-10-09 NOTE — Telephone Encounter (Signed)
Should be ok She should monitor glucose 3 times daily for 3 days after steroid injection

## 2010-10-09 NOTE — Telephone Encounter (Signed)
Pt was seen by Dr. Despina Hick last week and requested a corizone injection.  Would like to make sure this is ok since pt is a diabetic. Please return call to [place].

## 2010-10-10 ENCOUNTER — Other Ambulatory Visit: Payer: Self-pay | Admitting: Internal Medicine

## 2010-10-10 ENCOUNTER — Other Ambulatory Visit: Payer: BC Managed Care – PPO

## 2010-10-11 NOTE — Telephone Encounter (Signed)
Pt aware and [facility] aware, pt wanted Dr Cato Mulligan to know she is going to [employer] because she couldn't get in with you till 10/10

## 2010-10-17 ENCOUNTER — Ambulatory Visit: Payer: BC Managed Care – PPO | Admitting: Internal Medicine

## 2010-10-31 ENCOUNTER — Other Ambulatory Visit (INDEPENDENT_AMBULATORY_CARE_PROVIDER_SITE_OTHER): Payer: BC Managed Care – PPO

## 2010-10-31 DIAGNOSIS — E785 Hyperlipidemia, unspecified: Secondary | ICD-10-CM

## 2010-10-31 DIAGNOSIS — T887XXA Unspecified adverse effect of drug or medicament, initial encounter: Secondary | ICD-10-CM

## 2010-10-31 DIAGNOSIS — E119 Type 2 diabetes mellitus without complications: Secondary | ICD-10-CM

## 2010-10-31 LAB — LIPID PANEL
Cholesterol: 85 mg/dL (ref 0–200)
HDL: 35.2 mg/dL — ABNORMAL LOW (ref >39.00)
LDL Cholesterol: 37 mg/dL (ref 0–99)
VLDL: 13 mg/dL (ref 0.0–40.0)

## 2010-10-31 LAB — BASIC METABOLIC PANEL
Calcium: 8.6 mg/dL (ref 8.4–10.5)
Creatinine, Ser: 1 mg/dL (ref 0.4–1.2)
GFR: 62.95 mL/min (ref >60.00)

## 2010-10-31 LAB — HEPATIC FUNCTION PANEL
ALT: 28 U/L (ref 0–35)
Bilirubin, Direct: 0.1 mg/dL (ref 0.0–0.3)
Total Protein: 7.1 g/dL (ref 6.0–8.3)

## 2010-11-02 LAB — LIPID PANEL
Cholesterol: 79 mg/dL (ref 0–200)
LDL Cholesterol: 43 mg/dL (ref 0–99)

## 2010-11-02 LAB — CBC
MCV: 86 fL (ref 78.0–100.0)
Platelets: 389 10*3/uL (ref 150–400)
WBC: 9.1 10*3/uL (ref 4.0–10.5)

## 2010-11-02 LAB — URINALYSIS, MICROSCOPIC ONLY
Glucose, UA: NEGATIVE mg/dL
Leukocytes, UA: NEGATIVE
Nitrite: NEGATIVE
Specific Gravity, Urine: 1.01 (ref 1.005–1.030)
pH: 6 (ref 5.0–8.0)

## 2010-11-02 LAB — GLUCOSE, CAPILLARY
Glucose-Capillary: 113 mg/dL — ABNORMAL HIGH (ref 70–99)
Glucose-Capillary: 146 mg/dL — ABNORMAL HIGH (ref 70–99)

## 2010-11-02 LAB — COMPREHENSIVE METABOLIC PANEL
AST: 35 U/L (ref 0–37)
Albumin: 3.4 g/dL — ABNORMAL LOW (ref 3.5–5.2)
Chloride: 105 mEq/L (ref 96–112)
Creatinine, Ser: 0.92 mg/dL (ref 0.4–1.2)
GFR calc Af Amer: >60 mL/min (ref >60)
Potassium: 3.8 mEq/L (ref 3.5–5.1)
Sodium: 136 mEq/L (ref 135–145)
Total Bilirubin: 0.5 mg/dL (ref 0.3–1.2)

## 2010-11-02 LAB — HEMOGLOBIN A1C
Hgb A1c MFr Bld: 8.8 % — ABNORMAL HIGH (ref 4.6–6.5)
Hgb A1c MFr Bld: 7 % — ABNORMAL HIGH (ref 4.6–6.1)
Mean Plasma Glucose: 154 mg/dL

## 2010-11-02 LAB — DIFFERENTIAL
Basophils Absolute: 0 10*3/uL (ref 0.0–0.1)
Eosinophils Relative: 2 % (ref 0–5)
Lymphocytes Relative: 28 % (ref 12–46)
Monocytes Absolute: 0.5 10*3/uL (ref 0.1–1.0)

## 2010-11-02 LAB — CARDIAC PANEL(CRET KIN+CKTOT+MB+TROPI): Troponin I: <0.01 ng/mL (ref 0.00–0.06)

## 2010-11-08 ENCOUNTER — Encounter: Payer: Self-pay | Admitting: Internal Medicine

## 2010-11-08 ENCOUNTER — Other Ambulatory Visit: Payer: Self-pay | Admitting: Internal Medicine

## 2010-11-08 ENCOUNTER — Ambulatory Visit (INDEPENDENT_AMBULATORY_CARE_PROVIDER_SITE_OTHER): Payer: BC Managed Care – PPO | Admitting: Internal Medicine

## 2010-11-08 DIAGNOSIS — E119 Type 2 diabetes mellitus without complications: Secondary | ICD-10-CM

## 2010-11-08 DIAGNOSIS — N959 Unspecified menopausal and perimenopausal disorder: Secondary | ICD-10-CM

## 2010-11-08 DIAGNOSIS — K219 Gastro-esophageal reflux disease without esophagitis: Secondary | ICD-10-CM

## 2010-11-08 DIAGNOSIS — Z23 Encounter for immunization: Secondary | ICD-10-CM

## 2010-11-08 DIAGNOSIS — E785 Hyperlipidemia, unspecified: Secondary | ICD-10-CM

## 2010-11-08 DIAGNOSIS — N19 Unspecified kidney failure: Secondary | ICD-10-CM

## 2010-11-08 DIAGNOSIS — R42 Dizziness and giddiness: Secondary | ICD-10-CM

## 2010-11-08 DIAGNOSIS — I10 Essential (primary) hypertension: Secondary | ICD-10-CM

## 2010-11-08 MED ORDER — INSULIN NPH ISOPHANE & REGULAR (70-30) 100 UNIT/ML ~~LOC~~ SUSP: 23.0000 [IU] | Freq: Two times a day (BID) | SUBCUTANEOUS | Status: DC

## 2010-11-08 NOTE — Assessment & Plan Note (Signed)
She is compliant with insulin Increase dose  She needs to lose weight---1200Kcal diet, regular exercise

## 2010-11-08 NOTE — Progress Notes (Signed)
  Subjective:    Patient ID: [name], female    DOB: [birth year], 64 y.o.   MRN: [id number]  HPI  DM--not following a diet or exercise plan. Taking meds  New problem of Vertigo---for 2-3 months. Typically happens when she changes position in bed.  She thinks she has a mass on neck--left sided for several weeks. Seems to have resolved the past several days.   HTN---tolerating meds, no home bps  Chronic back pain---says she uses occasional hydrocodone.   Past Medical History  Diagnosis Date  . Depression   . Diabetes mellitus   . Hypertension   . Arthritis   . Renal failure   . Hyperlipidemia   . GERD (gastroesophageal reflux disease)    Past Surgical History  Procedure Date  . Knee arthroscopy     bilateral  . Gastric bypass   . Incontinence surgery   . Replacement total knee     reports that she has never smoked. She does not have any smokeless tobacco history on file. Her alcohol and drug histories not on file. family history is not on file. Allergies  Allergen Reactions  . Ibuprofen     REACTION: SOB, severe swelling  . Iohexol      Desc: pt. codes, no pre meds 11/07/04      Review of Systems  patient denies chest pain, shortness of breath, orthopnea. Denies lower extremity edema, abdominal pain, change in appetite, change in bowel movements. Patient denies rashes, musculoskeletal complaints. No other specific complaints in a complete review of systems.      Objective:   Physical Exam  Well-developed well-nourished female in no acute distress. HEENT exam atraumatic, normocephalic, extraocular muscles are intact. Neck is supple. No jugular venous distention no thyromegaly. Chest clear to auscultation without increased work of breathing. Cardiac exam S1 and S2 are regular. Abdominal exam active bowel sounds, soft, nontender. Morbidly obese Extremities no edema. Neurologic exam she is alert without any motor sensory deficits. Gait uses cane          Assessment & Plan:

## 2010-11-10 ENCOUNTER — Ambulatory Visit: Payer: BC Managed Care – PPO | Attending: Internal Medicine | Admitting: Physical Therapy

## 2010-11-10 DIAGNOSIS — R42 Dizziness and giddiness: Secondary | ICD-10-CM | POA: Insufficient documentation

## 2010-11-10 DIAGNOSIS — IMO0001 Reserved for inherently not codable concepts without codable children: Secondary | ICD-10-CM | POA: Insufficient documentation

## 2010-11-13 ENCOUNTER — Telehealth: Payer: Self-pay | Admitting: *Deleted

## 2010-11-13 NOTE — Telephone Encounter (Signed)
Pt's FBSs are running 130-215, and before dinner 40-50 points higher than her readings on Glipizide.  Highest was 215. Started back on Glipizide today.

## 2010-11-13 NOTE — Telephone Encounter (Signed)
ok 

## 2010-11-15 ENCOUNTER — Telehealth: Payer: Self-pay | Admitting: *Deleted

## 2010-11-15 NOTE — Telephone Encounter (Signed)
Pt is wanting a new rx for metrocream

## 2010-11-15 NOTE — Telephone Encounter (Signed)
Ok Metrocream, apply to face qhs

## 2010-11-16 MED ORDER — METRONIDAZOLE 0.75 % EX CREA: TOPICAL_CREAM | Freq: Every day | CUTANEOUS | Status: DC

## 2010-11-16 NOTE — Telephone Encounter (Signed)
rx sent in electronically 

## 2010-11-16 NOTE — Telephone Encounter (Signed)
See note

## 2010-11-17 ENCOUNTER — Other Ambulatory Visit: Payer: Self-pay | Admitting: Internal Medicine

## 2010-11-24 ENCOUNTER — Encounter: Payer: BC Managed Care – PPO | Admitting: Physical Therapy

## 2010-11-28 ENCOUNTER — Telehealth: Payer: Self-pay | Admitting: Internal Medicine

## 2010-11-28 DIAGNOSIS — E119 Type 2 diabetes mellitus without complications: Secondary | ICD-10-CM

## 2010-11-28 MED ORDER — INSULIN NPH ISOPHANE & REGULAR (70-30) 100 UNIT/ML ~~LOC~~ SUSP: 23.0000 [IU] | Freq: Two times a day (BID) | SUBCUTANEOUS | Status: DC

## 2010-11-28 NOTE — Telephone Encounter (Signed)
Ok to change sig per Dr Cato Mulligan.  Pharmacy called and updated in med list

## 2010-11-28 NOTE — Telephone Encounter (Signed)
Pharmacist said that pts insurance is charging a double  copay for insulin NPH-insulin regular (HUMULIN 70/30 PEN) (70-30) 100 UNIT/ML injection for 32 day supply. Pls change directions to Increase as Directed.

## 2010-12-09 ENCOUNTER — Other Ambulatory Visit: Payer: Self-pay | Admitting: Internal Medicine

## 2011-02-15 ENCOUNTER — Other Ambulatory Visit: Payer: Self-pay | Admitting: Internal Medicine

## 2011-03-25 ENCOUNTER — Other Ambulatory Visit: Payer: Self-pay | Admitting: Internal Medicine

## 2011-04-13 ENCOUNTER — Other Ambulatory Visit: Payer: BC Managed Care – PPO

## 2011-04-13 LAB — BASIC METABOLIC PANEL
BUN: 15 mg/dL (ref 6–23)
Calcium: 9.9 mg/dL (ref 8.4–10.5)
Creatinine, Ser: 1.1 mg/dL (ref 0.4–1.2)
GFR: 50.94 mL/min — ABNORMAL LOW (ref >60.00)

## 2011-04-13 LAB — LIPID PANEL: VLDL: 21.2 mg/dL (ref 0.0–40.0)

## 2011-04-13 LAB — HEPATIC FUNCTION PANEL
ALT: 39 U/L — ABNORMAL HIGH (ref 0–35)
AST: 43 U/L — ABNORMAL HIGH (ref 0–37)
Alkaline Phosphatase: 85 U/L (ref 39–117)
Bilirubin, Direct: 0.1 mg/dL (ref 0.0–0.3)
Total Bilirubin: 0.3 mg/dL (ref 0.3–1.2)

## 2011-04-13 LAB — HEMOGLOBIN A1C: Hgb A1c MFr Bld: 8.5 % — ABNORMAL HIGH (ref 4.6–6.5)

## 2011-04-18 ENCOUNTER — Telehealth: Payer: Self-pay | Admitting: *Deleted

## 2011-04-18 NOTE — Telephone Encounter (Signed)
Hycodan, 1 tsp q 8 hours prn cough. 120 cc/ 0 refills

## 2011-04-18 NOTE — Telephone Encounter (Addendum)
Pt states she has the flu....body aches, sore throat, fevers up to 102 when not taking Tylenol, coughing so hard she has dry heaves, and productive (green).  Yellow nasal mucus.  Incontinent of Urine when coughing.  Asking for cough meds to [facility] and possibly an antibiotic for sinus symptoms.

## 2011-04-19 MED ORDER — HYDROCODONE-HOMATROPINE 5-1.5 MG/5ML PO SYRP: 5.0000 mL | ORAL_SOLUTION | Freq: Three times a day (TID) | ORAL | Status: DC | PRN

## 2011-04-19 NOTE — Telephone Encounter (Signed)
Notified pt. 

## 2011-04-22 ENCOUNTER — Emergency Department (HOSPITAL_COMMUNITY)
Admission: EM | Admit: 2011-04-22 | Discharge: 2011-04-22 | Disposition: A | Discharge status: Home / Self Care | Payer: BC Managed Care – PPO | Attending: Emergency Medicine | Admitting: Emergency Medicine

## 2011-04-22 ENCOUNTER — Encounter (HOSPITAL_COMMUNITY): Payer: Self-pay | Admitting: *Deleted

## 2011-04-22 ENCOUNTER — Other Ambulatory Visit: Payer: Self-pay

## 2011-04-22 ENCOUNTER — Emergency Department (HOSPITAL_COMMUNITY): Payer: BC Managed Care – PPO

## 2011-04-22 DIAGNOSIS — E119 Type 2 diabetes mellitus without complications: Secondary | ICD-10-CM | POA: Insufficient documentation

## 2011-04-22 DIAGNOSIS — R1110 Vomiting, unspecified: Secondary | ICD-10-CM | POA: Insufficient documentation

## 2011-04-22 DIAGNOSIS — I10 Essential (primary) hypertension: Secondary | ICD-10-CM | POA: Insufficient documentation

## 2011-04-22 DIAGNOSIS — K219 Gastro-esophageal reflux disease without esophagitis: Secondary | ICD-10-CM | POA: Insufficient documentation

## 2011-04-22 DIAGNOSIS — I498 Other specified cardiac arrhythmias: Secondary | ICD-10-CM | POA: Insufficient documentation

## 2011-04-22 DIAGNOSIS — J40 Bronchitis, not specified as acute or chronic: Secondary | ICD-10-CM | POA: Insufficient documentation

## 2011-04-22 DIAGNOSIS — Z794 Long term (current) use of insulin: Secondary | ICD-10-CM | POA: Insufficient documentation

## 2011-04-22 DIAGNOSIS — R5381 Other malaise: Secondary | ICD-10-CM | POA: Insufficient documentation

## 2011-04-22 LAB — CARDIAC PANEL(CRET KIN+CKTOT+MB+TROPI)
CK, MB: 2.3 ng/mL (ref 0.3–4.0)
Relative Index: 1.4 (ref 0.0–2.5)

## 2011-04-22 LAB — CBC
HCT: 39.2 % (ref 36.0–46.0)
Hemoglobin: 12.9 g/dL (ref 12.0–15.0)
MCV: 86.2 fL (ref 78.0–100.0)
RBC: 4.55 MIL/uL (ref 3.87–5.11)
WBC: 13.1 10*3/uL — ABNORMAL HIGH (ref 4.0–10.5)

## 2011-04-22 LAB — BASIC METABOLIC PANEL
CO2: 25 mEq/L (ref 19–32)
Calcium: 9.7 mg/dL (ref 8.4–10.5)
Creatinine, Ser: 0.89 mg/dL (ref 0.50–1.10)
Glucose, Bld: 203 mg/dL — ABNORMAL HIGH (ref 70–99)

## 2011-04-22 LAB — DIFFERENTIAL
Eosinophils Relative: 1 % (ref 0–5)
Lymphocytes Relative: 31 % (ref 12–46)
Lymphs Abs: 4.1 10*3/uL — ABNORMAL HIGH (ref 0.7–4.0)
Monocytes Absolute: 0.9 10*3/uL (ref 0.1–1.0)

## 2011-04-22 MED ORDER — METHYLPREDNISOLONE SODIUM SUCC 125 MG IJ SOLR
125.0000 mg | Freq: Once | INTRAMUSCULAR | Status: AC
  Administered 2011-04-22: 125 mg via INTRAVENOUS
  Filled 2011-04-22: qty 2

## 2011-04-22 MED ORDER — PREDNISONE 10 MG PO TABS: 40.0000 mg | ORAL_TABLET | Freq: Every day | ORAL | Status: DC

## 2011-04-22 MED ORDER — ALBUTEROL SULFATE (5 MG/ML) 0.5% IN NEBU
5.0000 mg | INHALATION_SOLUTION | Freq: Once | RESPIRATORY_TRACT | Status: AC
  Administered 2011-04-22: 5 mg via RESPIRATORY_TRACT
  Filled 2011-04-22: qty 1

## 2011-04-22 MED ORDER — AZITHROMYCIN 250 MG PO TABS
500.0000 mg | ORAL_TABLET | Freq: Once | ORAL | Status: AC
  Administered 2011-04-22: 500 mg via ORAL
  Filled 2011-04-22: qty 2

## 2011-04-22 MED ORDER — AZITHROMYCIN 250 MG PO TABS: 250.0000 mg | ORAL_TABLET | Freq: Every day | ORAL | Status: AC

## 2011-04-22 MED ORDER — ALBUTEROL SULFATE HFA 108 (90 BASE) MCG/ACT IN AERS
2.0000 | INHALATION_SPRAY | RESPIRATORY_TRACT | Status: DC | PRN
  Administered 2011-04-22: 2 via RESPIRATORY_TRACT
  Filled 2011-04-22: qty 6.7

## 2011-04-22 NOTE — ED Notes (Signed)
Pt here for cough and congestion, recent diagnosed with bronchitis and today worsening, dry cough noted. Pt complains of swelling to bilateral lower legs.

## 2011-04-22 NOTE — ED Provider Notes (Signed)
History     CSN: [id number]  Arrival date & time 04/22/11  1347   First MD Initiated Contact with Patient 04/22/11 1501      Chief Complaint  Patient presents with  . Chest Pain  . Shortness of Breath    (Consider location/radiation/quality/duration/timing/severity/associated sxs/prior treatment) HPI Comments: Pt was having cough/bronchitis like symptoms for last week.  Cough is mostly non-productive.  Has fever a week ago at onset, but none now.  For last 2 days, cough has worsened, no more SOB, can't lay flat due to SOB.  Has had increased leg swelling.  No N/V/D other than some post-tussive emesis.  Has been taking rx cough med without relief which she got from her PMD last week.  Has hx of bronchitis with wheezing in the past, but no other episodes of wheezing.  No hx of DVT, no pain to calves.  No recent travel/surgery.  Has had some pain to lower chest and back, worse with coughing spells.  Patient is a 66 y.o. female presenting with shortness of breath. The history is provided by the patient.  Shortness of Breath  The current episode started 2 days ago. The onset was gradual. The problem has been gradually worsening. The problem is moderate. The symptoms are aggravated by a supine position and activity. Associated symptoms include chest pain, cough and shortness of breath. Pertinent negatives include no fever and no rhinorrhea.    Past Medical History  Diagnosis Date  . Depression   . Diabetes mellitus   . Hypertension   . Arthritis   . Renal failure   . Hyperlipidemia   . GERD (gastroesophageal reflux disease)     Past Surgical History  Procedure Date  . Knee arthroscopy     bilateral  . Gastric bypass   . Incontinence surgery   . Replacement total knee     History reviewed. No pertinent family history.  History  Substance Use Topics  . Smoking status: Never Smoker   . Smokeless tobacco: Not on file  . Alcohol Use:     OB History    Grav Para Term Preterm  Abortions TAB SAB Ect Mult Living                  Review of Systems  Constitutional: Positive for fatigue. Negative for fever, chills and diaphoresis.  HENT: Negative for congestion, rhinorrhea and sneezing.   Eyes: Negative.   Respiratory: Positive for cough and shortness of breath. Negative for chest tightness.   Cardiovascular: Positive for chest pain and leg swelling.  Gastrointestinal: Positive for vomiting. Negative for nausea, abdominal pain, diarrhea and blood in stool.  Genitourinary: Negative for frequency, hematuria, flank pain and difficulty urinating.  Musculoskeletal: Negative for back pain and arthralgias.  Skin: Negative for rash.  Neurological: Negative for dizziness, speech difficulty, weakness, numbness and headaches.    Allergies  Ibuprofen; Iohexol; and Nsaids  Home Medications   Current Outpatient Rx  Name Route Sig Dispense Refill  . FUROSEMIDE 40 MG PO TABS Oral Take 40 mg by mouth 2 (two) times daily.    Marland Kitchen HYDROCODONE-ACETAMINOPHEN 5-500 MG PO TABS Oral Take 1 tablet by mouth 2 (two) times daily as needed. For pain.    . INSULIN ISOPHANE & REGULAR (70-30) 100 UNIT/ML [place] SUSP Subcutaneous Inject 23 Units into the skin 2 (two) times daily. Increase as directed    . METFORMIN HCL 500 MG PO TABS Oral Take 500 mg by mouth 2 (two) times daily with  a meal.    . METRONIDAZOLE 1 % EX CREA Topical Apply 1 application topically 2 (two) times daily.    . ADULT MULTIVITAMIN W/MINERALS CH Oral Take 1 tablet by mouth daily.    Marland Kitchen OMEPRAZOLE 20 MG PO CPDR Oral Take 20 mg by mouth daily as needed. For heartburn    . POTASSIUM CHLORIDE CRYS ER 20 MEQ PO TBCR Oral Take 20 mEq by mouth 2 (two) times daily.    . TELMISARTAN 80 MG PO TABS Oral Take 80 mg by mouth daily.    . AZITHROMYCIN 250 MG PO TABS Oral Take 1 tablet (250 mg total) by mouth daily. Take one tablet PO once daily 4 tablet 0  . PREDNISONE 10 MG PO TABS Oral Take 4 tablets (40 mg total) by mouth daily. 20 tablet  0    BP 141/87  Pulse 104  Temp(Src) 97.9 F (36.6 C) (Oral)  Resp 15  SpO2 97%  Physical Exam  Constitutional: She is oriented to person, place, and time. She appears well-developed and well-nourished.  HENT:  Head: Normocephalic and atraumatic.  Right Ear: External ear normal.  Left Ear: External ear normal.  Eyes: Pupils are equal, round, and reactive to light.  Neck: Normal range of motion. Neck supple.  Cardiovascular: Normal rate, regular rhythm and normal heart sounds.   Pulmonary/Chest: Effort normal. No respiratory distress. She has wheezes. She has no rales. She exhibits no tenderness.       Bilateral rhonchi and expiratory wheezes  Abdominal: Soft. Bowel sounds are normal. There is no tenderness. There is no rebound and no guarding.  Musculoskeletal: Normal range of motion. She exhibits no edema.  Lymphadenopathy:    She has no cervical adenopathy.  Neurological: She is alert and oriented to person, place, and time.  Skin: Skin is warm and dry. No rash noted.  Psychiatric: She has a normal mood and affect.    ED Course  Procedures (including critical care time)  Results for orders placed during the hospital encounter of 04/22/11  CBC      Component Value Range   WBC 13.1 (*) 4.0 - 10.5 (K/uL)   RBC 4.55  3.87 - 5.11 (MIL/uL)   Hemoglobin 12.9  12.0 - 15.0 (g/dL)   HCT 16.1  09.6 - 04.5 (%)   MCV 86.2  78.0 - 100.0 (fL)   MCH 28.4  26.0 - 34.0 (pg)   MCHC 32.9  30.0 - 36.0 (g/dL)   RDW 40.9  81.1 - 91.4 (%)   Platelets 406 (*) 150 - 400 (K/uL)  DIFFERENTIAL      Component Value Range   Neutrophils Relative 60  43 - 77 (%)   Neutro Abs 7.9 (*) 1.7 - 7.7 (K/uL)   Lymphocytes Relative 31  12 - 46 (%)   Lymphs Abs 4.1 (*) 0.7 - 4.0 (K/uL)   Monocytes Relative 7  3 - 12 (%)   Monocytes Absolute 0.9  0.1 - 1.0 (K/uL)   Eosinophils Relative 1  0 - 5 (%)   Eosinophils Absolute 0.2  0.0 - 0.7 (K/uL)   Basophils Relative 1  0 - 1 (%)   Basophils Absolute 0.1   0.0 - 0.1 (K/uL)  BASIC METABOLIC PANEL      Component Value Range   Sodium 135  135 - 145 (mEq/L)   Potassium 4.0  3.5 - 5.1 (mEq/L)   Chloride 98  96 - 112 (mEq/L)   CO2 25  19 - 32 (mEq/L)  Glucose, Bld 203 (*) 70 - 99 (mg/dL)   BUN 17  6 - 23 (mg/dL)   Creatinine, Ser 3.97  0.50 - 1.10 (mg/dL)   Calcium 9.7  8.4 - 67.3 (mg/dL)   GFR calc non Af Amer 67 (*) >90 (mL/min)   GFR calc Af Amer 78 (*) >90 (mL/min)  CARDIAC PANEL(CRET KIN+CKTOT+MB+TROPI)      Component Value Range   Total CK 161  7 - 177 (U/L)   CK, MB 2.3  0.3 - 4.0 (ng/mL)   Troponin I <0.30  <0.30 (ng/mL)   Relative Index 1.4  0.0 - 2.5    Dg Chest 2 View  04/22/2011  *RADIOLOGY REPORT*  Clinical Data: Dry cough and shortness of breath.  CHEST - 2 VIEW  Comparison: PA and lateral chest 12/06/2004. CT chest 09/08/2003.  Findings: Calcified granuloma is again seen in the left upper lobe. The lungs are otherwise clear.  Heart size is normal.  No pneumothorax or pleural effusion.  Thoracic degenerative change noted.  IMPRESSION: No acute finding.  Original Report Authenticated By: Bernadene Bell. Maricela Curet, M.D.     Date: 04/22/2011  Rate: 103  Rhythm: sinus tachycardia  QRS Axis: normal  Intervals: normal  ST/T Wave abnormalities: normal  Conduction Disutrbances:none  Narrative Interpretation:   Old EKG Reviewed: unchanged    1. Bronchitis       MDM  Pt with likely bronchitis.  No evidence of CHF, no pneumonia.  No symtpoms to suggest PE.  Have given solumedrol and albuterol nebs, pt feeling much better.  Will d/c to f/u with PMD        Rolan Bucco, MD 04/22/11 [phone]

## 2011-04-22 NOTE — Discharge Instructions (Signed)

## 2011-04-22 NOTE — ED Notes (Signed)
Patient transported to X-ray 

## 2011-04-22 NOTE — ED Notes (Signed)
Reports recently having bronchitis, having cough and now chest pain and sob, swelling to lower legs. spo2 99% on room air, ekg being done at triage.

## 2011-04-23 ENCOUNTER — Telehealth: Payer: Self-pay | Admitting: *Deleted

## 2011-04-23 NOTE — Telephone Encounter (Signed)
Ok to take metformin bid. Check bmet this week

## 2011-04-23 NOTE — Telephone Encounter (Signed)
Pt is taking Metformin 500mg , Glipizide 10 mg, 23 unit Humalin Insulin in the am.  Evening : repeat. Now is taking an extra 500 mg. Metformin while on steroids.  Was seen in the hospital and given Solu Medrol, and oral Prednisone 40 mg q day x 4 for bronchitis.  Is this ok with Dr. Cato Mulligan?  Only takes the extra Metformin if BS above 200.  Has appt on May 09, 2011.

## 2011-04-24 ENCOUNTER — Other Ambulatory Visit (INDEPENDENT_AMBULATORY_CARE_PROVIDER_SITE_OTHER): Payer: BC Managed Care – PPO

## 2011-04-24 DIAGNOSIS — N19 Unspecified kidney failure: Secondary | ICD-10-CM

## 2011-04-24 DIAGNOSIS — R7309 Other abnormal glucose: Secondary | ICD-10-CM

## 2011-04-24 LAB — BASIC METABOLIC PANEL
CO2: 27 mEq/L (ref 19–32)
GFR: 61.37 mL/min (ref >60.00)
Glucose, Bld: 285 mg/dL — ABNORMAL HIGH (ref 70–99)
Potassium: 5 mEq/L (ref 3.5–5.1)
Sodium: 134 mEq/L — ABNORMAL LOW (ref 135–145)

## 2011-04-24 NOTE — Telephone Encounter (Signed)
Pt is already taken metformin bid but what she is saying that sometimes she takes it tid if her sugar is over 200 but it is not very often.  She wants to know if she can continue doing this.  Her sugar was 107 this morning.  appt scheduled for BMET this afternoon

## 2011-04-24 NOTE — Telephone Encounter (Signed)
Pt aware and states she understands.

## 2011-04-24 NOTE — Telephone Encounter (Signed)
When blood sugar is above 200 before breakfast or dinner, have her increase insulin by 2 units. Do not take metformin more than bid

## 2011-05-02 ENCOUNTER — Other Ambulatory Visit: Payer: BC Managed Care – PPO

## 2011-05-09 ENCOUNTER — Ambulatory Visit: Payer: BC Managed Care – PPO | Admitting: Internal Medicine

## 2011-05-10 ENCOUNTER — Other Ambulatory Visit (INDEPENDENT_AMBULATORY_CARE_PROVIDER_SITE_OTHER): Payer: BC Managed Care – PPO

## 2011-05-10 DIAGNOSIS — I10 Essential (primary) hypertension: Secondary | ICD-10-CM

## 2011-05-10 LAB — BASIC METABOLIC PANEL
CO2: 27 mEq/L (ref 19–32)
Chloride: 99 mEq/L (ref 96–112)
Creatinine, Ser: 1 mg/dL (ref 0.4–1.2)
Potassium: 4.1 mEq/L (ref 3.5–5.1)

## 2011-05-28 ENCOUNTER — Ambulatory Visit (INDEPENDENT_AMBULATORY_CARE_PROVIDER_SITE_OTHER): Payer: BC Managed Care – PPO | Admitting: Internal Medicine

## 2011-05-28 VITALS — BP 120/80 | Temp 98.1°F | Wt 275.0 lb

## 2011-05-28 DIAGNOSIS — E785 Hyperlipidemia, unspecified: Secondary | ICD-10-CM

## 2011-05-28 DIAGNOSIS — I10 Essential (primary) hypertension: Secondary | ICD-10-CM

## 2011-05-28 DIAGNOSIS — Z23 Encounter for immunization: Secondary | ICD-10-CM

## 2011-05-28 DIAGNOSIS — E119 Type 2 diabetes mellitus without complications: Secondary | ICD-10-CM

## 2011-05-28 DIAGNOSIS — N19 Unspecified kidney failure: Secondary | ICD-10-CM

## 2011-05-28 MED ORDER — METFORMIN HCL 1000 MG PO TABS: 1000.0000 mg | ORAL_TABLET | Freq: Two times a day (BID) | ORAL | Status: DC

## 2011-05-28 NOTE — Progress Notes (Signed)
Patient ID: [name], female   DOB: Oct 23, 1945, 66 y.o.   MRN: [id number]   patient comes in for followup of multiple medical problems including type 2 diabetes, hyperlipidemia, hypertension. The patient does not check blood sugar or blood pressure at home. The patetient does not follow an exercise or diet program. The patient denies any polyuria, polydipsia.  In the past the patient has gone to diabetic treatment center. The patient is tolerating medications  Without difficulty. The patient does admit to medication compliance.   She does not exercise at all  Knee pain---sees Dr. Windy Carina steroid injections (see resulting blood sugar)  Past Medical History  Diagnosis Date  . Depression   . Diabetes mellitus   . Hypertension   . Arthritis   . Renal failure   . Hyperlipidemia   . GERD (gastroesophageal reflux disease)     History   Social History  . Marital Status: Married    Spouse Name: N/A    Number of Children: N/A  . Years of Education: N/A   Occupational History  . Not on file.   Social History Main Topics  . Smoking status: Never Smoker   . Smokeless tobacco: Not on file  . Alcohol Use:   . Drug Use:   . Sexually Active:    Other Topics Concern  . Not on file   Social History Narrative  . No narrative on file    Past Surgical History  Procedure Date  . Knee arthroscopy     bilateral  . Gastric bypass   . Incontinence surgery   . Replacement total knee     No family history on file.  Allergies  Allergen Reactions  . Ibuprofen Swelling    Shortness of breath  . Iohexol Other (See Comments)    "coded"   . Nsaids Swelling    Current Outpatient Prescriptions on File Prior to Visit  Medication Sig Dispense Refill  . furosemide (LASIX) 40 MG tablet Take 40 mg by mouth 2 (two) times daily.      Marland Kitchen HYDROcodone-acetaminophen (VICODIN) 5-500 MG per tablet Take 1 tablet by mouth 2 (two) times daily as needed. For pain.      Marland Kitchen insulin  NPH-insulin regular (NOVOLIN 70/30) (70-30) 100 UNIT/ML injection Inject 23 Units into the skin 2 (two) times daily. Increase as directed      . metFORMIN (GLUCOPHAGE) 500 MG tablet Take 500 mg by mouth 2 (two) times daily with a meal.      . metronidazole (NORITATE) 1 % cream Apply 1 application topically 2 (two) times daily.      . Multiple Vitamin (MULITIVITAMIN WITH MINERALS) TABS Take 1 tablet by mouth daily.      Marland Kitchen omeprazole (PRILOSEC) 20 MG capsule Take 20 mg by mouth daily as needed. For heartburn      . potassium chloride SA (K-DUR,KLOR-CON) 20 MEQ tablet Take 20 mEq by mouth 2 (two) times daily.      Marland Kitchen telmisartan (MICARDIS) 80 MG tablet Take 80 mg by mouth daily.      Marland Kitchen DISCONTD: potassium chloride (KLOR-CON) 20 MEQ packet Take 20 mEq by mouth 2 (two) times daily.           patient denies chest pain, shortness of breath, orthopnea. Denies lower extremity edema, abdominal pain, change in appetite, change in bowel movements. Patient denies rashes, musculoskeletal complaints. No other specific complaints in a complete review of systems.   BP 120/80  Temp(Src) 98.1 F (36.7 C) (  Oral)  Wt 275 lb (124.739 kg)  Well-developed well-nourished female in no acute distress. HEENT exam atraumatic, normocephalic, extraocular muscles are intact. Neck is supple. No jugular venous distention no thyromegaly. Chest clear to auscultation without increased work of breathing. Cardiac exam S1 and S2 are regular. Abdominal exam active bowel sounds, soft, nontender. Extremities no edema. Neurologic exam she is alert without any motor sensory deficits. Uses a cane for ambulation

## 2011-05-30 ENCOUNTER — Other Ambulatory Visit: Payer: Self-pay | Admitting: Internal Medicine

## 2011-05-31 NOTE — Assessment & Plan Note (Signed)
Poor control. She desperately needs to lose weight and follow a low-calorie diet. I also encouraged her to participate in vigorous exercise. Her blood sugar will not be controllable and when she modified her lifestyle.

## 2011-05-31 NOTE — Assessment & Plan Note (Signed)
BP Readings from Last 3 Encounters:  05/28/11 120/80  04/22/11 135/68  11/08/10 126/82   reasonable control despite her weight. Continue current medications.

## 2011-06-05 ENCOUNTER — Other Ambulatory Visit: Payer: Self-pay | Admitting: Internal Medicine

## 2011-06-18 ENCOUNTER — Other Ambulatory Visit: Payer: Self-pay | Admitting: Internal Medicine

## 2011-07-09 ENCOUNTER — Other Ambulatory Visit (INDEPENDENT_AMBULATORY_CARE_PROVIDER_SITE_OTHER): Payer: BC Managed Care – PPO

## 2011-07-09 DIAGNOSIS — T8489XA Other specified complication of internal orthopedic prosthetic devices, implants and grafts, initial encounter: Secondary | ICD-10-CM

## 2011-07-09 LAB — CBC WITH DIFFERENTIAL/PLATELET
Basophils Relative: 0.4 % (ref 0.0–3.0)
Eosinophils Relative: 1.6 % (ref 0.0–5.0)
HCT: 38.4 % (ref 36.0–46.0)
Hemoglobin: 12.4 g/dL (ref 12.0–15.0)
Lymphs Abs: 2.7 10*3/uL (ref 0.7–4.0)
MCV: 87.7 fl (ref 78.0–100.0)
Monocytes Absolute: 0.7 10*3/uL (ref 0.1–1.0)
Neutro Abs: 6.1 10*3/uL (ref 1.4–7.7)
Platelets: 387 10*3/uL (ref 150.0–400.0)
WBC: 9.6 10*3/uL (ref 4.5–10.5)

## 2011-07-09 LAB — HIGH SENSITIVITY CRP: CRP, High Sensitivity: 16.01 mg/dL — ABNORMAL HIGH (ref 0.000–5.000)

## 2011-07-12 ENCOUNTER — Other Ambulatory Visit (HOSPITAL_COMMUNITY): Payer: Self-pay | Admitting: Orthopedic Surgery

## 2011-07-12 DIAGNOSIS — M25562 Pain in left knee: Secondary | ICD-10-CM

## 2011-07-12 DIAGNOSIS — T8489XA Other specified complication of internal orthopedic prosthetic devices, implants and grafts, initial encounter: Secondary | ICD-10-CM

## 2011-07-13 ENCOUNTER — Telehealth: Payer: Self-pay | Admitting: Internal Medicine

## 2011-07-13 NOTE — Telephone Encounter (Addendum)
Pt called and is needing to get lab work ordered prior to bone scan on 07/24/11. Pt is needing to get rheumatoid arthiritis, rh and lupus titer. Pls advise. Pt req call back from nurse.   Also pt is wanting a second opinion re: bone scan. Pt is allergic to the dye that they used for CT before and coded. Pt just have several concerns that she wants to discuss with Dr Cato Mulligan prior to bone scan.

## 2011-07-16 NOTE — Telephone Encounter (Signed)
Pt has appt with Dr Cato Mulligan at 9:30 and she will go over with him then

## 2011-07-16 NOTE — Telephone Encounter (Signed)
Who is ordering these tests? We can do labs here but will need to know who to fax them to. Bone scan does not use the same contrast as CT

## 2011-07-16 NOTE — Telephone Encounter (Signed)
Left message for pt to call back  °

## 2011-07-17 ENCOUNTER — Ambulatory Visit (INDEPENDENT_AMBULATORY_CARE_PROVIDER_SITE_OTHER): Payer: BC Managed Care – PPO | Admitting: Internal Medicine

## 2011-07-17 VITALS — BP 132/86 | Temp 98.4°F | Wt 275.0 lb

## 2011-07-17 DIAGNOSIS — M1710 Unilateral primary osteoarthritis, unspecified knee: Secondary | ICD-10-CM

## 2011-07-17 NOTE — Progress Notes (Signed)
Patient ID: [name], female   DOB: 04-09-1945, 66 y.o.   MRN: [id number]  Knee pain- Saw ortho-drew labs, elevated CRP. Advised bone scan. She now feels well. I have advised her to call ortho and see whether she still needs test.  DM-- she says home cbgs are better because she is exercising (minimally but at least doing something)  Past Medical History  Diagnosis Date  . Depression   . Diabetes mellitus   . Hypertension   . Arthritis   . Renal failure   . Hyperlipidemia   . GERD (gastroesophageal reflux disease)     History   Social History  . Marital Status: Married    Spouse Name: N/A    Number of Children: N/A  . Years of Education: N/A   Occupational History  . Not on file.   Social History Main Topics  . Smoking status: Never Smoker   . Smokeless tobacco: Not on file  . Alcohol Use:   . Drug Use:   . Sexually Active:    Other Topics Concern  . Not on file   Social History Narrative  . No narrative on file    Past Surgical History  Procedure Date  . Knee arthroscopy     bilateral  . Gastric bypass   . Incontinence surgery   . Replacement total knee     No family history on file.  Allergies  Allergen Reactions  . Ibuprofen Swelling    Shortness of breath  . Iohexol Other (See Comments)    "coded"   . Nsaids Swelling    Current Outpatient Prescriptions on File Prior to Visit  Medication Sig Dispense Refill  . furosemide (LASIX) 40 MG tablet Take 40 mg by mouth 2 (two) times daily.      Marland Kitchen GLIPIZIDE XL 10 MG 24 hr tablet Take 1 tablet by mouth Twice daily.      Marland Kitchen HUMULIN 70/30 (70-30) 100 UNIT/ML injection INJECT 23 UNITS SQ WITH BREAKFAST AND 23 UNITS WITH DINNER AND INCREASE AS DIRECTED.  10 mL  5  . metFORMIN (GLUCOPHAGE) 1000 MG tablet Take 1 tablet (1,000 mg total) by mouth 2 (two) times daily with a meal.  180 tablet  3  . metronidazole (NORITATE) 1 % cream Apply 1 application topically 2 (two) times daily.      Marland Kitchen MICARDIS 80 MG tablet  TAKE 1 TABLET ONCE DAILY.  90 each  1  . Multiple Vitamin (MULITIVITAMIN WITH MINERALS) TABS Take 1 tablet by mouth daily.      Marland Kitchen omeprazole (PRILOSEC) 20 MG capsule Take 20 mg by mouth daily as needed. For heartburn      . potassium chloride SA (K-DUR,KLOR-CON) 20 MEQ tablet Take 20 mEq by mouth 2 (two) times daily.      Marland Kitchen VICODIN 5-500 MG per tablet TAKE 1 TABLET TWICE DAILY AS NEEDED FOR PAIN.  20 each  2  . DISCONTD: potassium chloride (KLOR-CON) 20 MEQ packet Take 20 mEq by mouth 2 (two) times daily.           patient denies chest pain, shortness of breath, orthopnea. Denies lower extremity edema, abdominal pain, change in appetite, change in bowel movements. Patient denies rashes, musculoskeletal complaints. No other specific complaints in a complete review of systems.   BP 132/86  Temp 98.4 F (36.9 C) (Oral)  Wt 275 lb (124.739 kg) Past Medical History  Diagnosis Date  . Depression   . Diabetes mellitus   . Hypertension   .  Arthritis   . Renal failure   . Hyperlipidemia   . GERD (gastroesophageal reflux disease)     History   Social History  . Marital Status: Married    Spouse Name: N/A    Number of Children: N/A  . Years of Education: N/A   Occupational History  . Not on file.   Social History Main Topics  . Smoking status: Never Smoker   . Smokeless tobacco: Not on file  . Alcohol Use:   . Drug Use:   . Sexually Active:    Other Topics Concern  . Not on file   Social History Narrative  . No narrative on file    Past Surgical History  Procedure Date  . Knee arthroscopy     bilateral  . Gastric bypass   . Incontinence surgery   . Replacement total knee     No family history on file.  Allergies  Allergen Reactions  . Ibuprofen Swelling    Shortness of breath  . Iohexol Other (See Comments)    "coded"   . Nsaids Swelling    Current Outpatient Prescriptions on File Prior to Visit  Medication Sig Dispense Refill  . furosemide (LASIX) 40 MG  tablet Take 40 mg by mouth 2 (two) times daily.      Marland Kitchen GLIPIZIDE XL 10 MG 24 hr tablet Take 1 tablet by mouth Twice daily.      Marland Kitchen HUMULIN 70/30 (70-30) 100 UNIT/ML injection INJECT 23 UNITS SQ WITH BREAKFAST AND 23 UNITS WITH DINNER AND INCREASE AS DIRECTED.  10 mL  5  . metFORMIN (GLUCOPHAGE) 1000 MG tablet Take 1 tablet (1,000 mg total) by mouth 2 (two) times daily with a meal.  180 tablet  3  . metronidazole (NORITATE) 1 % cream Apply 1 application topically 2 (two) times daily.      Marland Kitchen MICARDIS 80 MG tablet TAKE 1 TABLET ONCE DAILY.  90 each  1  . Multiple Vitamin (MULITIVITAMIN WITH MINERALS) TABS Take 1 tablet by mouth daily.      Marland Kitchen omeprazole (PRILOSEC) 20 MG capsule Take 20 mg by mouth daily as needed. For heartburn      . potassium chloride SA (K-DUR,KLOR-CON) 20 MEQ tablet Take 20 mEq by mouth 2 (two) times daily.      Marland Kitchen VICODIN 5-500 MG per tablet TAKE 1 TABLET TWICE DAILY AS NEEDED FOR PAIN.  20 each  2  . DISCONTD: potassium chloride (KLOR-CON) 20 MEQ packet Take 20 mEq by mouth 2 (two) times daily.           patient denies chest pain, shortness of breath, orthopnea. Denies lower extremity edema, abdominal pain, change in appetite, change in bowel movements. Patient denies rashes, musculoskeletal complaints. No other specific complaints in a complete review of systems.   BP 132/86  Temp 98.4 F (36.9 C) (Oral)  Wt 275 lb (124.739 kg)  Well-developed well-nourished female in no acute distress. HEENT exam atraumatic, normocephalic, extraocular muscles are intact. Neck is supple. No jugular venous distention no thyromegaly. Chest clear to auscultation without increased work of breathing. No knee effusion. Walking with a cane.   Knee Pain- resolved She will call ortho and see if she still needs Bone Scan  Elevated CRP can likely be explained by her multiple other medical problems.

## 2011-07-23 ENCOUNTER — Other Ambulatory Visit: Payer: Self-pay | Admitting: *Deleted

## 2011-07-23 MED ORDER — FUROSEMIDE 40 MG PO TABS: 40.0000 mg | ORAL_TABLET | Freq: Two times a day (BID) | ORAL | Status: DC

## 2011-07-24 ENCOUNTER — Encounter (HOSPITAL_COMMUNITY): Payer: BC Managed Care – PPO

## 2011-07-24 ENCOUNTER — Other Ambulatory Visit (HOSPITAL_COMMUNITY): Payer: BC Managed Care – PPO

## 2011-08-27 ENCOUNTER — Other Ambulatory Visit: Payer: BC Managed Care – PPO

## 2011-09-24 ENCOUNTER — Other Ambulatory Visit: Payer: BC Managed Care – PPO

## 2011-10-03 ENCOUNTER — Encounter: Payer: Self-pay | Admitting: Internal Medicine

## 2011-10-03 ENCOUNTER — Ambulatory Visit (INDEPENDENT_AMBULATORY_CARE_PROVIDER_SITE_OTHER): Payer: BC Managed Care – PPO | Admitting: Internal Medicine

## 2011-10-03 VITALS — BP 144/76 | HR 102 | Temp 98.0°F | Wt 263.0 lb

## 2011-10-03 DIAGNOSIS — E1140 Type 2 diabetes mellitus with diabetic neuropathy, unspecified: Secondary | ICD-10-CM

## 2011-10-03 DIAGNOSIS — E1149 Type 2 diabetes mellitus with other diabetic neurological complication: Secondary | ICD-10-CM

## 2011-10-03 DIAGNOSIS — N19 Unspecified kidney failure: Secondary | ICD-10-CM

## 2011-10-03 DIAGNOSIS — E785 Hyperlipidemia, unspecified: Secondary | ICD-10-CM

## 2011-10-03 DIAGNOSIS — E1142 Type 2 diabetes mellitus with diabetic polyneuropathy: Secondary | ICD-10-CM

## 2011-10-03 DIAGNOSIS — E119 Type 2 diabetes mellitus without complications: Secondary | ICD-10-CM

## 2011-10-03 DIAGNOSIS — Z0000 Encounter for general adult medical examination without abnormal findings: Secondary | ICD-10-CM

## 2011-10-03 LAB — BASIC METABOLIC PANEL
BUN: 27 mg/dL — ABNORMAL HIGH (ref 6–23)
CO2: 26 mEq/L (ref 19–32)
Calcium: 9.5 mg/dL (ref 8.4–10.5)
Chloride: 101 mEq/L (ref 96–112)
Creatinine, Ser: 1 mg/dL (ref 0.4–1.2)
Glucose, Bld: 177 mg/dL — ABNORMAL HIGH (ref 70–99)

## 2011-10-03 LAB — LIPID PANEL
HDL: 37.1 mg/dL — ABNORMAL LOW (ref >39.00)
Triglycerides: 67 mg/dL (ref 0.0–149.0)

## 2011-10-03 LAB — HEPATIC FUNCTION PANEL
ALT: 31 U/L (ref 0–35)
Albumin: 3.4 g/dL — ABNORMAL LOW (ref 3.5–5.2)
Total Protein: 7.3 g/dL (ref 6.0–8.3)

## 2011-10-03 LAB — HM DIABETES FOOT EXAM

## 2011-10-03 NOTE — Progress Notes (Signed)
Patient ID: [name], female   DOB: 19-Apr-1945, 66 y.o.   MRN: [id number]   patient comes in for followup of multiple medical problems including type 2 diabetes, hyperlipidemia, hypertension. The patient does not check blood sugar or blood pressure at home. The patetient does not follow an exercise or diet program. The patient denies any polyuria, polydipsia.  In the past the patient has gone to diabetic treatment center. The patient is tolerating medications  Without difficulty. The patient does admit to medication compliance.    Past Medical History  Diagnosis Date  . Depression   . Diabetes mellitus   . Hypertension   . Arthritis   . Renal failure   . Hyperlipidemia   . GERD (gastroesophageal reflux disease)     History   Social History  . Marital Status: Married    Spouse Name: N/A    Number of Children: N/A  . Years of Education: N/A   Occupational History  . Not on file.   Social History Main Topics  . Smoking status: Never Smoker   . Smokeless tobacco: Not on file  . Alcohol Use:   . Drug Use:   . Sexually Active:    Other Topics Concern  . Not on file   Social History Narrative  . No narrative on file    Past Surgical History  Procedure Date  . Knee arthroscopy     bilateral  . Gastric bypass   . Incontinence surgery   . Replacement total knee     No family history on file.  Allergies  Allergen Reactions  . Ibuprofen Swelling    Shortness of breath  . Iohexol Other (See Comments)    "coded"   . Nsaids Swelling    Current Outpatient Prescriptions on File Prior to Visit  Medication Sig Dispense Refill  . BD INSULIN SYRINGE ULTRAFINE 31G X 5/16" 0.3 ML MISC as directed.      Marland Kitchen FREESTYLE TEST STRIPS test strip as directed.      . furosemide (LASIX) 40 MG tablet Take 1 tablet (40 mg total) by mouth 2 (two) times daily.  60 tablet  5  . GLIPIZIDE XL 10 MG 24 hr tablet Take 1 tablet by mouth Twice daily.      Marland Kitchen HUMULIN 70/30 (70-30) 100 UNIT/ML  injection INJECT 23 UNITS SQ WITH BREAKFAST AND 23 UNITS WITH DINNER AND INCREASE AS DIRECTED.  10 mL  5  . Lancets (FREESTYLE) lancets as directed.      . metFORMIN (GLUCOPHAGE) 1000 MG tablet Take 1 tablet (1,000 mg total) by mouth 2 (two) times daily with a meal.  180 tablet  3  . metronidazole (NORITATE) 1 % cream Apply 1 application topically 2 (two) times daily.      Marland Kitchen MICARDIS 80 MG tablet TAKE 1 TABLET ONCE DAILY.  90 each  1  . Multiple Vitamin (MULITIVITAMIN WITH MINERALS) TABS Take 1 tablet by mouth daily.      Marland Kitchen omeprazole (PRILOSEC) 20 MG capsule Take 20 mg by mouth daily as needed. For heartburn      . potassium chloride SA (K-DUR,KLOR-CON) 20 MEQ tablet Take 20 mEq by mouth 2 (two) times daily.      . valACYclovir (VALTREX) 500 MG tablet Take 1 tablet by mouth as needed.      Marland Kitchen VICODIN 5-500 MG per tablet TAKE 1 TABLET TWICE DAILY AS NEEDED FOR PAIN.  20 each  2  . DISCONTD: potassium chloride (KLOR-CON) 20 MEQ  packet Take 20 mEq by mouth 2 (two) times daily.           patient denies chest pain, shortness of breath, orthopnea. Denies lower extremity edema, abdominal pain, change in appetite, change in bowel movements. Patient denies rashes, musculoskeletal complaints. No other specific complaints in a complete review of systems.   BP 144/76  Pulse 102  Temp 98 F (36.7 C) (Oral)  Wt 263 lb (119.296 kg) Overweight female in no acute distress. HEENT exam atraumatic, normocephalic, extraocular muscles are intact. Neck is supple. No jugular venous distention no thyromegaly. Chest clear to auscultation without increased work of breathing. Cardiac exam S1 and S2 are regular. Abdominal exam active bowel sounds, soft, nontender. Extremities no edema. Neurologic exam she is alert without any motor sensory deficits. Gait is normal.

## 2011-10-04 ENCOUNTER — Other Ambulatory Visit: Payer: Self-pay | Admitting: Internal Medicine

## 2011-10-04 DIAGNOSIS — IMO0002 Reserved for concepts with insufficient information to code with codable children: Secondary | ICD-10-CM

## 2011-10-04 DIAGNOSIS — Z0000 Encounter for general adult medical examination without abnormal findings: Secondary | ICD-10-CM

## 2011-10-06 NOTE — Assessment & Plan Note (Signed)
Lipid Panel     Component Value Date/Time   CHOL 85 10/03/2011 1002   TRIG 67.0 10/03/2011 1002   HDL 37.10* 10/03/2011 1002   CHOLHDL 2 10/03/2011 1002   VLDL 13.4 10/03/2011 1002   LDLCALC 35 10/03/2011 1002   Adequate control

## 2011-10-06 NOTE — Assessment & Plan Note (Signed)
Creatinine has been normal

## 2011-10-06 NOTE — Assessment & Plan Note (Signed)
She is not following adiet or exercise plan She desperately needs to lose weight but she is not interested

## 2011-10-23 ENCOUNTER — Ambulatory Visit (INDEPENDENT_AMBULATORY_CARE_PROVIDER_SITE_OTHER): Payer: BC Managed Care – PPO

## 2011-10-23 DIAGNOSIS — Z23 Encounter for immunization: Secondary | ICD-10-CM

## 2011-10-29 ENCOUNTER — Other Ambulatory Visit: Payer: Self-pay | Admitting: *Deleted

## 2011-10-29 ENCOUNTER — Telehealth: Payer: Self-pay | Admitting: Internal Medicine

## 2011-10-29 ENCOUNTER — Encounter: Payer: Self-pay | Admitting: Internal Medicine

## 2011-10-29 MED ORDER — INSULIN NPH ISOPHANE & REGULAR (70-30) 100 UNIT/ML ~~LOC~~ SUSP: SUBCUTANEOUS | Status: DC

## 2011-10-29 NOTE — Telephone Encounter (Addendum)
Pt called and has a skin tag/keratosis on back. Pts undergarment is rubbing it. Pt is req an appt to see Dr Fabian Sharp if pcp is not avail. Pt is aware that Dr Fabian Sharp is out of office this wk.  Pt is not req to change pcps, pt just wants to see Dr Fabian Sharp when pcp isn't avail. Pls advise.

## 2011-10-30 NOTE — Telephone Encounter (Signed)
Made appt with Adline Mango on 11/01/11.

## 2011-11-01 ENCOUNTER — Ambulatory Visit (INDEPENDENT_AMBULATORY_CARE_PROVIDER_SITE_OTHER): Payer: BC Managed Care – PPO | Admitting: Family

## 2011-11-01 ENCOUNTER — Encounter: Payer: Self-pay | Admitting: Family

## 2011-11-01 VITALS — BP 120/60 | HR 105 | Temp 98.4°F | Wt 263.0 lb

## 2011-11-01 DIAGNOSIS — D485 Neoplasm of uncertain behavior of skin: Secondary | ICD-10-CM

## 2011-11-01 NOTE — Progress Notes (Signed)
Subjective:    Patient ID: [name], female    DOB: [birth year], 66 y.o.   MRN: [id number]  HPI 66 year old white female is in requesting to have a lesion removed from her right upper back.    Review of Systems  Constitutional: Negative.   Respiratory: Negative.   Cardiovascular: Negative.   Skin:       Lesion to the right upper back  Hematological: Negative.   Psychiatric/Behavioral: Negative.    Past Medical History  Diagnosis Date  . Depression   . Diabetes mellitus   . Hypertension   . Arthritis   . Renal failure   . Hyperlipidemia   . GERD (gastroesophageal reflux disease)     History   Social History  . Marital Status: Married    Spouse Name: N/A    Number of Children: N/A  . Years of Education: N/A   Occupational History  . Not on file.   Social History Main Topics  . Smoking status: Never Smoker   . Smokeless tobacco: Not on file  . Alcohol Use:   . Drug Use:   . Sexually Active:    Other Topics Concern  . Not on file   Social History Narrative  . No narrative on file    Past Surgical History  Procedure Date  . Knee arthroscopy     bilateral  . Gastric bypass   . Incontinence surgery   . Replacement total knee     No family history on file.  Allergies  Allergen Reactions  . Ibuprofen Swelling    Shortness of breath  . Iohexol Other (See Comments)    "coded"   . Nsaids Swelling    Current Outpatient Prescriptions on File Prior to Visit  Medication Sig Dispense Refill  . BD INSULIN SYRINGE ULTRAFINE 31G X 5/16" 0.3 ML MISC as directed.      Marland Kitchen FREESTYLE TEST STRIPS test strip as directed.      . furosemide (LASIX) 40 MG tablet Take 1 tablet (40 mg total) by mouth 2 (two) times daily.  60 tablet  5  . GLIPIZIDE XL 10 MG 24 hr tablet Take 1 tablet by mouth Twice daily.      . insulin NPH-insulin regular (HUMULIN 70/30) (70-30) 100 UNIT/ML injection Inject 23 units SQ with breakfast and 23 units with dinner. Increase as directed   10 mL  5  . Lancets (FREESTYLE) lancets USE AS DIRECTED.  100 each  11  . LIDODERM 5 % 1 patch. 12 hours on, 12 hours off      . metFORMIN (GLUCOPHAGE) 1000 MG tablet Take 1 tablet (1,000 mg total) by mouth 2 (two) times daily with a meal.  180 tablet  3  . metronidazole (NORITATE) 1 % cream Apply 1 application topically 2 (two) times daily.      Marland Kitchen MICARDIS 80 MG tablet TAKE 1 TABLET ONCE DAILY.  90 each  1  . Multiple Vitamin (MULITIVITAMIN WITH MINERALS) TABS Take 1 tablet by mouth daily.      Marland Kitchen omeprazole (PRILOSEC) 20 MG capsule Take 20 mg by mouth daily as needed. For heartburn      . potassium chloride SA (K-DUR,KLOR-CON) 20 MEQ tablet Take 20 mEq by mouth 2 (two) times daily.      . valACYclovir (VALTREX) 500 MG tablet Take 1 tablet by mouth as needed.      Marland Kitchen VICODIN 5-500 MG per tablet TAKE 1 TABLET TWICE DAILY AS NEEDED FOR PAIN.  20 each  2  . DISCONTD: potassium chloride (KLOR-CON) 20 MEQ packet Take 20 mEq by mouth 2 (two) times daily.          BP 120/60  Pulse 105  Temp 98.4 F (36.9 C) (Oral)  Wt 263 lb (119.296 kg)  SpO2 98%chart    Objective:   Physical Exam  Constitutional: She is oriented to person, place, and time. She appears well-developed and well-nourished.  Cardiovascular: Normal rate, regular rhythm and normal heart sounds.   Pulmonary/Chest: Effort normal and breath sounds normal.  Neurological: She is alert and oriented to person, place, and time.  Skin: Skin is warm and dry.       Lesion to the right upper back, rough texture, approx 1.5cm x 1cm. Dark gray color.   Psychiatric: She has a normal mood and affect.          Assessment & Plan:  Assessment: Lesion of unknown Etiology   Plan: Sent for biopsy. Call the office with any questions or concerns.

## 2011-11-01 NOTE — Patient Instructions (Addendum)
Biopsy  Care After  Refer to this sheet in the next few weeks. These instructions provide you with information on caring for yourself after your procedure. Your caregiver may also give you more specific instructions. Your treatment has been planned according to current medical practices, but problems sometimes occur. Call your caregiver if you have any problems or questions after your procedure.  If you had a fine needle biopsy, you may have soreness at the biopsy site for 1 to 2 days. If you had an open biopsy, you may have soreness at the biopsy site for 3 to 4 days.  HOME CARE INSTRUCTIONS   · You may resume normal diet and activities as directed.  · Change bandages (dressings) as directed. If your wound was closed with a skin glue (adhesive), it will wear off and begin to peel in 7 days.  · Only take over-the-counter or prescription medicines for pain, discomfort, or fever as directed by your caregiver.  · Ask your caregiver when you can bathe and get your wound wet.  SEEK IMMEDIATE MEDICAL CARE IF:   · You have increased bleeding (more than a small spot) from the biopsy site.  · You notice redness, swelling, or increasing pain at the biopsy site.  · You have pus coming from the biopsy site.  · You have a fever.  · You notice a bad smell coming from the biopsy site or dressing.  · You have a rash, have difficulty breathing, or have any allergic problems.  MAKE SURE YOU:   · Understand these instructions.  · Will watch your condition.  · Will get help right away if you are not doing well or get worse.  Document Released: 08/04/2004 Document Revised: 04/09/2011 Document Reviewed: 07/13/2010  [employer]® Patient Information ©2013 [employer].

## 2011-11-01 NOTE — Addendum Note (Signed)
Addended by: Azucena Freed on: 11/01/2011 02:16 PM   Modules accepted: Orders

## 2011-11-12 ENCOUNTER — Other Ambulatory Visit: Payer: Self-pay | Admitting: *Deleted

## 2011-11-12 MED ORDER — HYDROCORTISONE 2.5 % RE CREA: TOPICAL_CREAM | RECTAL | Status: DC | PRN

## 2011-11-12 MED ORDER — METRONIDAZOLE 1 % EX CREA: 1.0000 "application " | TOPICAL_CREAM | Freq: Two times a day (BID) | CUTANEOUS | Status: DC

## 2011-11-23 ENCOUNTER — Other Ambulatory Visit: Payer: Self-pay | Admitting: *Deleted

## 2011-11-23 MED ORDER — TELMISARTAN 80 MG PO TABS: 80.0000 mg | ORAL_TABLET | Freq: Every day | ORAL | Status: DC

## 2012-02-02 ENCOUNTER — Other Ambulatory Visit: Payer: Self-pay | Admitting: Internal Medicine

## 2012-02-11 ENCOUNTER — Other Ambulatory Visit: Payer: Self-pay | Admitting: Internal Medicine

## 2012-03-05 ENCOUNTER — Other Ambulatory Visit: Payer: Self-pay | Admitting: Internal Medicine

## 2012-03-14 ENCOUNTER — Other Ambulatory Visit: Payer: Self-pay | Admitting: Internal Medicine

## 2012-04-09 ENCOUNTER — Ambulatory Visit (INDEPENDENT_AMBULATORY_CARE_PROVIDER_SITE_OTHER): Payer: BC Managed Care – PPO | Admitting: Internal Medicine

## 2012-04-09 ENCOUNTER — Encounter: Payer: Self-pay | Admitting: Internal Medicine

## 2012-04-09 VITALS — BP 136/80 | HR 76 | Temp 98.4°F | Wt 275.0 lb

## 2012-04-09 DIAGNOSIS — K219 Gastro-esophageal reflux disease without esophagitis: Secondary | ICD-10-CM

## 2012-04-09 DIAGNOSIS — E785 Hyperlipidemia, unspecified: Secondary | ICD-10-CM

## 2012-04-09 DIAGNOSIS — N19 Unspecified kidney failure: Secondary | ICD-10-CM

## 2012-04-09 DIAGNOSIS — I10 Essential (primary) hypertension: Secondary | ICD-10-CM

## 2012-04-09 DIAGNOSIS — E119 Type 2 diabetes mellitus without complications: Secondary | ICD-10-CM

## 2012-04-09 LAB — CBC WITH DIFFERENTIAL/PLATELET
Eosinophils Relative: 1.7 % (ref 0.0–5.0)
MCV: 83.8 fl (ref 78.0–100.0)
Monocytes Absolute: 0.7 10*3/uL (ref 0.1–1.0)
Neutrophils Relative %: 66.1 % (ref 43.0–77.0)
Platelets: 441 10*3/uL — ABNORMAL HIGH (ref 150.0–400.0)
WBC: 10.2 10*3/uL (ref 4.5–10.5)

## 2012-04-09 LAB — BASIC METABOLIC PANEL
CO2: 25 mEq/L (ref 19–32)
Calcium: 9.6 mg/dL (ref 8.4–10.5)
GFR: 51.83 mL/min — ABNORMAL LOW (ref >60.00)
Sodium: 136 mEq/L (ref 135–145)

## 2012-04-09 LAB — HEMOGLOBIN A1C: Hgb A1c MFr Bld: 7.2 % — ABNORMAL HIGH (ref 4.6–6.5)

## 2012-04-09 LAB — HEPATIC FUNCTION PANEL
ALT: 49 U/L — ABNORMAL HIGH (ref 0–35)
Bilirubin, Direct: 0.2 mg/dL (ref 0.0–0.3)
Total Protein: 7.5 g/dL (ref 6.0–8.3)

## 2012-04-09 LAB — LIPID PANEL
Cholesterol: 88 mg/dL (ref 0–200)
LDL Cholesterol: 39 mg/dL (ref 0–99)
Triglycerides: 72 mg/dL (ref 0.0–149.0)

## 2012-04-09 LAB — TSH: TSH: 5.07 u[IU]/mL (ref 0.35–5.50)

## 2012-04-09 NOTE — Progress Notes (Signed)
Patient ID: [name], female   DOB: [birth year], 67 y.o.   MRN: [id number]  Had some LE swelling last week- resolved  OA knees- seeing dr. Lequita Halt-- not a candidate for surgery  dm2-- needs f/u. She does not follow an exercise or diet program. She is not trying to lose weight. She does not monitor her blood sugars very frequently.  She is feeling much better now that the lower extremity edema has resolved.  Past Medical History  Diagnosis Date  . Depression   . Diabetes mellitus   . Hypertension   . Arthritis   . Renal failure   . Hyperlipidemia   . GERD (gastroesophageal reflux disease)     History   Social History  . Marital Status: Married    Spouse Name: N/A    Number of Children: N/A  . Years of Education: N/A   Occupational History  . Not on file.   Social History Main Topics  . Smoking status: Never Smoker   . Smokeless tobacco: Not on file  . Alcohol Use:   . Drug Use:   . Sexually Active:    Other Topics Concern  . Not on file   Social History Narrative  . No narrative on file    Past Surgical History  Procedure Laterality Date  . Knee arthroscopy      bilateral  . Gastric bypass    . Incontinence surgery    . Replacement total knee      No family history on file.  Allergies  Allergen Reactions  . Ibuprofen Swelling    Shortness of breath  . Iohexol Other (See Comments)    "coded"   . Nsaids Swelling    Current Outpatient Prescriptions on File Prior to Visit  Medication Sig Dispense Refill  . BD INSULIN SYRINGE ULTRAFINE 31G X 5/16" 0.3 ML MISC USE AS DIRECTED TWICE DAILY.  100 each  PRN  . FREESTYLE TEST STRIPS test strip as directed.      . furosemide (LASIX) 40 MG tablet TAKE 1 TABLET TWICE DAILY.  180 tablet  1  . GLIPIZIDE XL 10 MG 24 hr tablet Take 1 tablet by mouth Twice daily.      Marland Kitchen HUMULIN 70/30 (70-30) 100 UNIT/ML injection INJECT 23 UNITS SQ WITH BREAKFAST AND 23 UNITS WITH DINNER AND INCREASE AS DIRECTED.  10 mL  6  .  hydrocortisone (PROCTOZONE-HC) 2.5 % rectal cream Place rectally as needed for hemorrhoids.  30 g  0  . Lancets (FREESTYLE) lancets USE AS DIRECTED.  100 each  11  . LIDODERM 5 % 1 patch. 12 hours on, 12 hours off      . metFORMIN (GLUCOPHAGE) 1000 MG tablet Take 1 tablet (1,000 mg total) by mouth 2 (two) times daily with a meal.  180 tablet  3  . metronidazole (NORITATE) 1 % cream Apply 1 application topically 2 (two) times daily.  60 g  3  . Multiple Vitamin (MULITIVITAMIN WITH MINERALS) TABS Take 1 tablet by mouth daily.      Marland Kitchen omeprazole (PRILOSEC) 20 MG capsule Take 20 mg by mouth daily as needed. For heartburn      . potassium chloride SA (K-DUR,KLOR-CON) 20 MEQ tablet TAKE 1 TABLET TWICE DAILY.  180 tablet  1  . telmisartan (MICARDIS) 80 MG tablet Take 1 tablet (80 mg total) by mouth daily.  90 tablet  1  . valACYclovir (VALTREX) 500 MG tablet Take 1 tablet by mouth as needed.      Marland Kitchen  VICODIN 5-500 MG per tablet TAKE 1 TABLET TWICE DAILY AS NEEDED FOR PAIN.  20 each  2  . [DISCONTINUED] potassium chloride (KLOR-CON) 20 MEQ packet Take 20 mEq by mouth 2 (two) times daily.         No current facility-administered medications on file prior to visit.     patient denies chest pain, shortness of breath, orthopnea. Denies lower extremity edema, abdominal pain, change in appetite, change in bowel movements. Patient denies rashes, musculoskeletal complaints. No other specific complaints in a complete review of systems.   BP 136/80  Pulse 76  Temp(Src) 98.4 F (36.9 C) (Oral)  Wt 275 lb (124.739 kg)  BMI 46.49 kg/m2  obese female in no acute distress. HEENT exam atraumatic, normocephalic, extraocular muscles are intact. Neck is supple. No jugular venous distention no thyromegaly. Chest clear to auscultation without increased work of breathing. Cardiac exam S1 and S2 are regular. Abdominal exam active bowel sounds, soft, nontender. Extremities no edema. Neurologic exam she is alert without any  motor sensory deficits. Using a cane for ambulation

## 2012-04-10 NOTE — Assessment & Plan Note (Signed)
Previously controlled. We'll check labs today.

## 2012-04-10 NOTE — Assessment & Plan Note (Signed)
Reasonably well controlled especially given her weight.

## 2012-04-10 NOTE — Assessment & Plan Note (Signed)
Will check laboratory work. She desperately needs to lose weight. I've talked to her about the need for daily exercise. She needs to follow a low-calorie diet. She is able to come up with multiple excuses as to why these tasks are difficult for her.

## 2012-05-01 ENCOUNTER — Other Ambulatory Visit: Payer: Self-pay | Admitting: Internal Medicine

## 2012-05-08 ENCOUNTER — Other Ambulatory Visit: Payer: Self-pay | Admitting: Internal Medicine

## 2012-06-19 ENCOUNTER — Ambulatory Visit (INDEPENDENT_AMBULATORY_CARE_PROVIDER_SITE_OTHER): Payer: BC Managed Care – PPO | Admitting: Family Medicine

## 2012-06-19 ENCOUNTER — Encounter: Payer: Self-pay | Admitting: Internal Medicine

## 2012-06-19 DIAGNOSIS — M549 Dorsalgia, unspecified: Secondary | ICD-10-CM

## 2012-06-19 DIAGNOSIS — M25569 Pain in unspecified knee: Secondary | ICD-10-CM

## 2012-06-19 DIAGNOSIS — G8929 Other chronic pain: Secondary | ICD-10-CM

## 2012-06-19 NOTE — Progress Notes (Signed)
No chief complaint on file.   HPI:  Acute visit for chronic pain: -hx LE swelling and OA knees - followed by Dr. Lequita [name] per PCP notes - not surgical candidate -she reports called PCP for referral to pain clinic - told needed an appt -chronic pain in both knees and lower back and hips bilateral and achilles tendon in L leg and front of both legs -reports has had workups with Dr. Cato [name] and Ortho - told extensive arthritis - but told can't have surgery -reports takes vicodin for chronic pain, but thinks needs increased dose for this and wants referral to pain clinic or PMR - keeps stating wanted to talk to Dr. Cato [name] about this  ROS: See pertinent positives and negatives per HPI.  Past Medical History  Diagnosis Date  . Depression   . Diabetes mellitus   . Hypertension   . Arthritis   . Renal failure   . Hyperlipidemia   . GERD (gastroesophageal reflux disease)     No family history on file.  History   Social History  . Marital Status: Married    Spouse Name: N/A    Number of Children: N/A  . Years of Education: N/A   Social History Main Topics  . Smoking status: Never Smoker   . Smokeless tobacco: Not on file  . Alcohol Use:   . Drug Use:   . Sexually Active:    Other Topics Concern  . Not on file   Social History Narrative  . No narrative on file    Current outpatient prescriptions:BD INSULIN SYRINGE ULTRAFINE 31G X 5/16" 0.3 ML MISC, USE AS DIRECTED TWICE DAILY., Disp: 100 each, Rfl: PRN;  FREESTYLE TEST STRIPS test strip, as directed., Disp: , Rfl: ;  furosemide (LASIX) 40 MG tablet, TAKE 1 TABLET TWICE DAILY., Disp: 180 tablet, Rfl: 1;  GLIPIZIDE XL 10 MG 24 hr tablet, Take 1 tablet by mouth Twice daily., Disp: , Rfl:  HUMULIN 70/30 (70-30) 100 UNIT/ML injection, INJECT 23 UNITS SQ WITH BREAKFAST AND 23 UNITS WITH DINNER AND INCREASE AS DIRECTED., Disp: 10 mL, Rfl: 6;  hydrocortisone (PROCTOZONE-HC) 2.5 % rectal cream, Place rectally as needed for hemorrhoids.,  Disp: 30 g, Rfl: 0;  Lancets (FREESTYLE) lancets, USE AS DIRECTED., Disp: 100 each, Rfl: 11;  LIDODERM 5 %, 1 patch. 12 hours on, 12 hours off, Disp: , Rfl:  metFORMIN (GLUCOPHAGE) 1000 MG tablet, TAKE 1 TABLET TWICE DAILY WITH FOOD., Disp: 180 tablet, Rfl: 6;  metronidazole (NORITATE) 1 % cream, Apply 1 application topically 2 (two) times daily., Disp: 60 g, Rfl: 3;  Multiple Vitamin (MULITIVITAMIN WITH MINERALS) TABS, Take 1 tablet by mouth daily., Disp: , Rfl: ;  omeprazole (PRILOSEC) 20 MG capsule, Take 20 mg by mouth daily as needed. For heartburn, Disp: , Rfl:  potassium chloride SA (K-DUR,KLOR-CON) 20 MEQ tablet, TAKE 1 TABLET TWICE DAILY., Disp: 180 tablet, Rfl: 1;  telmisartan (MICARDIS) 80 MG tablet, TAKE 1 TABLET ONCE DAILY., Disp: 30 tablet, Rfl: 3;  valACYclovir (VALTREX) 500 MG tablet, Take 1 tablet by mouth as needed., Disp: , Rfl: ;  VICODIN 5-500 MG per tablet, TAKE 1 TABLET TWICE DAILY AS NEEDED FOR PAIN., Disp: 20 each, Rfl: 2 [DISCONTINUED] potassium chloride (KLOR-CON) 20 MEQ packet, Take 20 mEq by mouth 2 (two) times daily.  , Disp: , Rfl:   EXAM:  There were no vitals filed for this visit.  There is no weight on file to calculate BMI.  GENERAL: vitals reviewed and listed above,  alert, oriented, appears well hydrated and in no acute distress  HEENT: atraumatic, conjunttiva clear, no obvious abnormalities on inspection of external nose and ears  NECK: no obvious masses on inspection  MS: moves all extremities without noticeable abnormality Walks with a cane Difuse TTP in soft tissue back and legs Normal sensation and strength in lower extremities bilaterally Neg SLFT and CLRT, -FABER, and Neg FADIT  PSYCH: pleasant and cooperative, no obvious depression or anxiety  ASSESSMENT AND PLAN:  Discussed the following assessment and plan:  Chronic back pain  Chronic knee pain, unspecified laterality  -discussed her chronic pain at length - she is afraid to go up on pain  medication dosing without talking to her PCP -she wants to see Dr. Ethelene [name] as her orthopedic Doc recommended this 2 weeks ago -she really wanted to discuss this with Dr. Cato [name]. Advised I have heard good things about Dr. Ethelene [name] and she reports she will see if Dr. Despina [name] can get her in with Dr. Ethelene [name]. -she wants me to let Dr. Timoteo [name] know about this - will send him a message. -Patient advised to return or notify a doctor immediately if symptoms worsen or persist or new concerns arise.  There are no Patient Instructions on file for this visit.   Paula Basque R.

## 2012-06-20 ENCOUNTER — Other Ambulatory Visit: Payer: Self-pay | Admitting: *Deleted

## 2012-06-20 MED ORDER — HYDROCODONE-ACETAMINOPHEN 5-500 MG PO TABS: ORAL_TABLET | ORAL | Status: DC

## 2012-08-03 ENCOUNTER — Other Ambulatory Visit: Payer: Self-pay | Admitting: Internal Medicine

## 2012-08-04 ENCOUNTER — Other Ambulatory Visit: Payer: Self-pay | Admitting: Internal Medicine

## 2012-09-30 ENCOUNTER — Other Ambulatory Visit: Payer: Self-pay | Admitting: *Deleted

## 2012-09-30 MED ORDER — HYDROCODONE-ACETAMINOPHEN 5-325 MG PO TABS: 1.0000 | ORAL_TABLET | Freq: Two times a day (BID) | ORAL | Status: DC | PRN

## 2012-10-02 ENCOUNTER — Telehealth: Payer: Self-pay | Admitting: Internal Medicine

## 2012-10-02 DIAGNOSIS — N19 Unspecified kidney failure: Secondary | ICD-10-CM

## 2012-10-02 DIAGNOSIS — N990 Postprocedural (acute) (chronic) kidney failure: Secondary | ICD-10-CM

## 2012-10-02 NOTE — Telephone Encounter (Signed)
Patient needs to have R TKA. Per Dr. Despina Hick, the joint is completely gone, so the sooner the better, as [name] cannot live with the chronic pain any more. Dr. [name] office requesting a note from Dr. Cato Mulligan to clear her for the R TKA. It will be done UNDER SPINAL - she will not be under general anesthesia. Patient has appt with Dr. Cato Mulligan 9/24 which she is going to keep, but cannot wait that long for the note. Surgery cannot be scheduled until Alusio's office receives this note. Also, Dr. Despina Hick has stopped the Synvisc injections, as the joint is totally deteriorated. Please fax signed note to Dr. [name] office ASAP.

## 2012-10-03 NOTE — Telephone Encounter (Signed)
Pt aware, referral order placed 

## 2012-10-03 NOTE — Telephone Encounter (Signed)
Is it ok to clear for surgery?

## 2012-10-03 NOTE — Telephone Encounter (Signed)
No Refer to [place] [facility]. Presurgical clearance-- hx of postop renal failure

## 2012-10-07 ENCOUNTER — Ambulatory Visit: Payer: BC Managed Care – PPO | Admitting: Internal Medicine

## 2012-10-17 ENCOUNTER — Telehealth: Payer: Self-pay | Admitting: Internal Medicine

## 2012-10-17 NOTE — Telephone Encounter (Signed)
It is ok to do that

## 2012-10-17 NOTE — Telephone Encounter (Signed)
Pt made another appt for 12/3. Would like to know if it is ok if her husband, Celsey Asselin, takes her appt on 9/25? He had the massivie heart attack/2 stints.Pls advise.

## 2012-10-18 NOTE — Telephone Encounter (Signed)
yes

## 2012-10-20 NOTE — Telephone Encounter (Signed)
Pt would like you to know she has appt at [facility] w/ renal specialist on friday.  Hopefully they will give letter for release so that you can send letter to [facility] for knee replacement under epidural.

## 2012-10-21 ENCOUNTER — Ambulatory Visit (INDEPENDENT_AMBULATORY_CARE_PROVIDER_SITE_OTHER): Payer: BC Managed Care – PPO

## 2012-10-21 DIAGNOSIS — Z23 Encounter for immunization: Secondary | ICD-10-CM

## 2012-10-22 ENCOUNTER — Ambulatory Visit: Payer: BC Managed Care – PPO | Admitting: Internal Medicine

## 2012-10-23 ENCOUNTER — Ambulatory Visit: Payer: BC Managed Care – PPO | Admitting: Internal Medicine

## 2012-11-05 NOTE — Telephone Encounter (Signed)
Pt states that when she seen the renal specialist, the Dr. Jeanene Erb her back the same day, and stated that she is cleared for surgery. She is inquiring to see if we have received anything from the renal specialist, in order for Dr. Cato Mulligan to clear her for surgery. She would like to be cleared asap, so that she can call Dr. Despina Hick to schedule her surgery. Please advise.

## 2012-11-12 ENCOUNTER — Telehealth: Payer: Self-pay | Admitting: Internal Medicine

## 2012-11-12 DIAGNOSIS — Z01810 Encounter for preprocedural cardiovascular examination: Secondary | ICD-10-CM

## 2012-11-12 NOTE — Telephone Encounter (Signed)
Pt calling to check status of letter that should have been sent to Dr. Cato Mulligan from nephrologist at [facility] clearing her for surgery.  Pt states per nephrologist she does need a cardiac evaluation prior to surgery.  Please confirm if letter was received from [facility] and order referral to cardiology for surgical clearance.

## 2012-11-12 NOTE — Telephone Encounter (Signed)
Dec 22 is a free date w/ dr Berton Lan.  Paperwork needs to be faxed to Imel[facility]    Fax [phone] Paperwork has been sent to Dr Cato Mulligan from [facility]  Kidney specialist gave the ok for pt to have knee replacement.  Pt would like to have this done on 12/22 if we can get paperwork to [place] in time.

## 2012-11-13 NOTE — Telephone Encounter (Signed)
Letter faxed to Dr Lequita Halt.  Referral order placed for cardiology

## 2012-11-13 NOTE — Telephone Encounter (Signed)
Ok to send paper work to dr [employer] Refer to [facility] for cardiac clearance

## 2012-12-01 ENCOUNTER — Encounter: Payer: Self-pay | Admitting: Cardiology

## 2012-12-01 ENCOUNTER — Ambulatory Visit (INDEPENDENT_AMBULATORY_CARE_PROVIDER_SITE_OTHER): Payer: BC Managed Care – PPO | Admitting: Cardiology

## 2012-12-01 VITALS — BP 130/74 | HR 97 | Ht 63.5 in | Wt 275.0 lb

## 2012-12-01 DIAGNOSIS — G8929 Other chronic pain: Secondary | ICD-10-CM

## 2012-12-01 DIAGNOSIS — R609 Edema, unspecified: Secondary | ICD-10-CM

## 2012-12-01 DIAGNOSIS — R600 Localized edema: Secondary | ICD-10-CM

## 2012-12-01 DIAGNOSIS — I10 Essential (primary) hypertension: Secondary | ICD-10-CM

## 2012-12-01 NOTE — Progress Notes (Signed)
Patient ID: TANGEE MARSZALEK, female   DOB: 01/19/1946, 67 y.o.   MRN: [id number]    Patient Name: [name] Date of Encounter: 12/01/2012  Primary Care Provider:  Judie Petit, MD Primary Cardiologist:  Tobias Alexander, H  Patient Profile  Preoperative evaluation prior to TKA  Problem List   Past Medical History  Diagnosis Date  . Depression   . Diabetes mellitus   . Hypertension   . Arthritis   . Renal failure   . Hyperlipidemia   . GERD (gastroesophageal reflux disease)    Past Surgical History  Procedure Laterality Date  . Knee arthroscopy      bilateral  . Gastric bypass    . Incontinence surgery    . Replacement total knee      Allergies  Allergies  Allergen Reactions  . Ibuprofen Swelling    Shortness of breath  . Iohexol Other (See Comments)    "coded"   . Nsaids Swelling   HPI  This is a 67 year old female with prior medical history of insulin-dependent diabetes mellitus, hypertension, CKD stage III who came for preop evaluation prior to her right knee replacement. The patient has suffered from severe osteoarthritis that led to a left knee replacement a few years ago. The surgery was complicated by multi- organ failure that required ICU stay for 2 weeks. Because of these complication she is now scheduled for her right knee replacement with a spinal anesthesia. The patient states that she never feels short of breath, she denies chest pain, orthopnea, paroxysmal nocturnal dyspnea, palpitations or syncope. The patient is minimally active secondary to her significant right knee pain and swelling. She is able to walk with her cane, she is able to walk 3 aisles in the grocery store without getting SOB, however pain in her knee would stop her from further activity.  Medications  Prior to Admission medications   Medication Sig Start Date End Date Taking? Authorizing Provider  BD INSULIN SYRINGE ULTRAFINE 31G X 5/16" 0.3 ML MISC USE AS DIRECTED TWICE  DAILY. 03/14/12  Yes Lindley Magnus, MD  Biotin 1000 MCG tablet Take 1,000 mcg by mouth 3 (three) times daily.   Yes Historical Provider, MD  Calcium Polycarbophil (FIBERCON PO) Take by mouth.   Yes Historical Provider, MD  Cholecalciferol (VITAMIN D-3) 1000 UNITS CAPS Take by mouth.   Yes Historical Provider, MD  docusate sodium (COLACE) 50 MG capsule Take by mouth 2 (two) times daily.   Yes Historical Provider, MD  FREESTYLE TEST STRIPS test strip as directed. 05/29/11  Yes Historical Provider, MD  furosemide (LASIX) 40 MG tablet TAKE 1 TABLET TWICE DAILY. 02/02/12  Yes Bruce Rexene Edison Swords, MD  GLIPIZIDE XL 10 MG 24 hr tablet Take 1 tablet by mouth Twice daily. 05/29/11  Yes Historical Provider, MD  HYDROcodone-acetaminophen (NORCO/VICODIN) 5-325 MG per tablet Take 1 tablet by mouth 2 (two) times daily as needed for pain. 09/30/12  Yes Lindley Magnus, MD  hydrocortisone (PROCTOZONE-HC) 2.5 % rectal cream Place rectally as needed for hemorrhoids. 11/12/11  Yes Lindley Magnus, MD  Lancets (FREESTYLE) lancets USE AS DIRECTED. 10/29/11  Yes Lindley Magnus, MD  LIDODERM 5 % 1 patch. 12 hours on, 12 hours off 09/12/11  Yes Historical Provider, MD  metFORMIN (GLUCOPHAGE) 1000 MG tablet TAKE 1 TABLET TWICE DAILY WITH FOOD. 05/08/12  Yes Bruce Romilda Garret, MD  metronidazole (NORITATE) 1 % cream Apply 1 application topically 2 (two) times daily. 11/12/11  Yes Bruce H  Swords, MD  Multiple Vitamin (MULITIVITAMIN WITH MINERALS) TABS Take 1 tablet by mouth daily.   Yes Historical Provider, MD  Multiple Vitamins-Minerals (ZINC PO) Take by mouth.   Yes Historical Provider, MD  NOVOLIN 70/30 (70-30) 100 UNIT/ML injection INJECT 23 UNITS SQ WITH BREAKFAST AND 23 UNITS WITH DINNER AND INCREASE AS DIRECTED. 08/04/12  Yes Lindley Magnus, MD  omeprazole (PRILOSEC) 20 MG capsule Take 20 mg by mouth daily as needed. For heartburn   Yes Historical Provider, MD  potassium chloride SA (K-DUR,KLOR-CON) 20 MEQ tablet TAKE 1 TABLET TWICE DAILY.  02/11/12  Yes Bruce Romilda Garret, MD  telmisartan (MICARDIS) 80 MG tablet TAKE 1 TABLET ONCE DAILY. 08/03/12  Yes Lindley Magnus, MD  valACYclovir (VALTREX) 500 MG tablet Take 1 tablet by mouth as needed. 04/15/11  Yes Historical Provider, MD   Family History  No family history on file.  Social History  History   Social History  . Marital Status: Married    Spouse Name: N/A    Number of Children: N/A  . Years of Education: N/A   Occupational History  . Not on file.   Social History Main Topics  . Smoking status: Never Smoker   . Smokeless tobacco: Not on file  . Alcohol Use:   . Drug Use:   . Sexual Activity:    Other Topics Concern  . Not on file   Social History Narrative  . No narrative on file     Review of Systems, as per HPI otherwise negative General:  No chills, fever, night sweats or weight changes.  Cardiovascular:  No chest pain, dyspnea on exertion, edema, orthopnea, palpitations, paroxysmal nocturnal dyspnea. Dermatological: No rash, lesions/masses Respiratory: No cough, dyspnea Urologic: No hematuria, dysuria Abdominal:   No nausea, vomiting, diarrhea, bright red blood per rectum, melena, or hematemesis Neurologic:  No visual changes, wkns, changes in mental status. All other systems reviewed and are otherwise negative except as noted above.  Physical Exam  Blood pressure 130/74, pulse 97, height 5' 3.5" (1.613 m), weight 275 lb (124.739 kg).  General: Pleasant, NAD Psych: Normal affect. Neuro: Alert and oriented X 3. Moves all extremities spontaneously. HEENT: Normal  Neck: Supple without bruits or JVD. Lungs:  Resp regular and unlabored, CTA. Heart: RRR no s3, s4, or murmurs. Abdomen: Soft, non-tender, non-distended, BS + x 4.  Extremities: No clubbing, cyanosis or edema. DP/PT/Radials 2+ and equal bilaterally.  Accessory Clinical Findings  ECG - normal sinus rhythm, 97 beats per minute, normal axis, low-voltage in the precordial leads, poor R wave  progression, borderline ECG.  Lipid Panel     Component Value Date/Time   CHOL 88 04/09/2012 0906   TRIG 72.0 04/09/2012 0906   HDL 34.70* 04/09/2012 0906   CHOLHDL 3 04/09/2012 0906   VLDL 14.4 04/09/2012 0906   LDLCALC 39 04/09/2012 0906     Assessment & Plan  67 year old female with no known h/o CAD or heart failure, that is scheduled for a total knee replacement under spinal anesthesia. The patient is currently asymptomatic, she can certainly achieve activity equivalent of 4 METS.  There is low volatge on her ECG. We will order an echocardiogram to rule out pericardial effusion or a cardiomyopathy. If it is negative there would be no contraindicated for her surgery.  Thank you for referring this patient to [place], we will follow the results and fax you the form ASAP.  Tobias Alexander, Rexene Edison, MD 12/01/2012, 2:58 PM

## 2012-12-01 NOTE — Patient Instructions (Signed)
Your physician has requested that you have an echocardiogram. Echocardiography is a painless test that uses sound waves to create images of your heart. It provides your doctor with information about the size and shape of your heart and how well your heart's chambers and valves are working. This procedure takes approximately one hour. There are no restrictions for this procedure.   Your physician wants you to follow-up as needed

## 2012-12-12 ENCOUNTER — Other Ambulatory Visit: Payer: Self-pay | Admitting: Internal Medicine

## 2012-12-15 ENCOUNTER — Ambulatory Visit (HOSPITAL_COMMUNITY): Payer: BC Managed Care – PPO | Attending: Cardiology | Admitting: Cardiology

## 2012-12-15 ENCOUNTER — Encounter: Payer: Self-pay | Admitting: Cardiology

## 2012-12-15 DIAGNOSIS — I10 Essential (primary) hypertension: Secondary | ICD-10-CM | POA: Insufficient documentation

## 2012-12-15 DIAGNOSIS — Z01810 Encounter for preprocedural cardiovascular examination: Secondary | ICD-10-CM | POA: Insufficient documentation

## 2012-12-15 DIAGNOSIS — R609 Edema, unspecified: Secondary | ICD-10-CM | POA: Insufficient documentation

## 2012-12-15 DIAGNOSIS — R600 Localized edema: Secondary | ICD-10-CM

## 2012-12-15 DIAGNOSIS — E119 Type 2 diabetes mellitus without complications: Secondary | ICD-10-CM | POA: Insufficient documentation

## 2012-12-15 DIAGNOSIS — E785 Hyperlipidemia, unspecified: Secondary | ICD-10-CM | POA: Insufficient documentation

## 2012-12-15 NOTE — Progress Notes (Signed)
Echo performed. 

## 2012-12-21 NOTE — Progress Notes (Signed)
Patient ID: [name], female   DOB: [birth year], 67 y.o.   MRN: [id number]  This is an addendum to my office note from 12/01/2012. Transthoracic echocardiogram showed normal left ventricular function and no pericardial effusion. There is currently no contraindication for this patient to undergo the scheduled orthopedic surgery.  Tobias Alexander, H 12/21/2012]

## 2012-12-23 ENCOUNTER — Telehealth: Payer: Self-pay | Admitting: Cardiology

## 2012-12-23 NOTE — Telephone Encounter (Signed)
Received request from Nurse fax box, documents faxed for surgical clearance. To: [facility] Fax number: [phone] Attention: 12/23/12/KM

## 2012-12-24 ENCOUNTER — Telehealth: Payer: Self-pay | Admitting: Cardiology

## 2012-12-24 NOTE — Telephone Encounter (Signed)
Received a call from [facility] with [facility] on behalf of Dr. Lequita Halt. Cordelia Pen states that the surgical clearance that was faxed on 12/23/12 was signed;however, it was not marked if the patient was cleared for surgery.  The original document was pulled from the [facility] folder as was the copy from the HIM department and taken to [facility] Via. Larita Fife will contact Dr. Delton See for completion of the surgical clearance document. Once completed please forward to:    To: Surgical Scheduling Fax number: [phone] Attention: Rosalva Ferron

## 2012-12-24 NOTE — Telephone Encounter (Signed)
Larita Fife Via /Dr. Delton See returned the surgical clearance document completed... Forwarded again: To: Surgical Scheduling Fax number: [phone] Attention: Rosalva Ferron

## 2012-12-26 ENCOUNTER — Ambulatory Visit (INDEPENDENT_AMBULATORY_CARE_PROVIDER_SITE_OTHER): Payer: BC Managed Care – PPO | Admitting: Internal Medicine

## 2012-12-26 ENCOUNTER — Encounter: Payer: Self-pay | Admitting: Internal Medicine

## 2012-12-26 VITALS — BP 130/76 | HR 102 | Temp 98.0°F | Resp 20

## 2012-12-26 DIAGNOSIS — I6350 Cerebral infarction due to unspecified occlusion or stenosis of unspecified cerebral artery: Secondary | ICD-10-CM

## 2012-12-26 DIAGNOSIS — Z01818 Encounter for other preprocedural examination: Secondary | ICD-10-CM

## 2012-12-26 DIAGNOSIS — IMO0002 Reserved for concepts with insufficient information to code with codable children: Secondary | ICD-10-CM

## 2012-12-26 DIAGNOSIS — Z23 Encounter for immunization: Secondary | ICD-10-CM

## 2012-12-26 DIAGNOSIS — E119 Type 2 diabetes mellitus without complications: Secondary | ICD-10-CM

## 2012-12-26 DIAGNOSIS — M1710 Unilateral primary osteoarthritis, unspecified knee: Secondary | ICD-10-CM

## 2012-12-26 DIAGNOSIS — E1169 Type 2 diabetes mellitus with other specified complication: Secondary | ICD-10-CM

## 2012-12-26 LAB — CBC WITH DIFFERENTIAL/PLATELET
Basophils Absolute: 0.1 10*3/uL (ref 0.0–0.1)
Eosinophils Absolute: 0.1 10*3/uL (ref 0.0–0.7)
HCT: 36.1 % (ref 36.0–46.0)
Lymphocytes Relative: 21.3 % (ref 12.0–46.0)
Lymphs Abs: 1.9 10*3/uL (ref 0.7–4.0)
MCHC: 32.6 g/dL (ref 30.0–36.0)
Monocytes Absolute: 0.7 10*3/uL (ref 0.1–1.0)
Monocytes Relative: 7.5 % (ref 3.0–12.0)
Neutro Abs: 6.3 10*3/uL (ref 1.4–7.7)
Platelets: 379 10*3/uL (ref 150.0–400.0)
RDW: 16 % — ABNORMAL HIGH (ref 11.5–14.6)
WBC: 9.1 10*3/uL (ref 4.5–10.5)

## 2012-12-26 LAB — HM DIABETES FOOT EXAM

## 2012-12-26 LAB — LIPID PANEL
HDL: 35.1 mg/dL — ABNORMAL LOW (ref >39.00)
LDL Cholesterol: 32 mg/dL (ref 0–99)
Total CHOL/HDL Ratio: 2
VLDL: 18.2 mg/dL (ref 0.0–40.0)

## 2012-12-26 LAB — BASIC METABOLIC PANEL
BUN: 14 mg/dL (ref 6–23)
Chloride: 101 mEq/L (ref 96–112)
Creatinine, Ser: 1 mg/dL (ref 0.4–1.2)
GFR: 61.78 mL/min (ref >60.00)
Glucose, Bld: 192 mg/dL — ABNORMAL HIGH (ref 70–99)

## 2012-12-26 LAB — HEPATIC FUNCTION PANEL
ALT: 35 U/L (ref 0–35)
AST: 37 U/L (ref 0–37)
Albumin: 3.3 g/dL — ABNORMAL LOW (ref 3.5–5.2)
Bilirubin, Direct: 0.2 mg/dL (ref 0.0–0.3)
Total Protein: 7 g/dL (ref 6.0–8.3)

## 2012-12-26 LAB — HEMOGLOBIN A1C: Hgb A1c MFr Bld: 7.8 % — ABNORMAL HIGH (ref 4.6–6.5)

## 2012-12-26 MED ORDER — HYDROCODONE-ACETAMINOPHEN 5-325 MG PO TABS: 1.0000 | ORAL_TABLET | Freq: Two times a day (BID) | ORAL | Status: DC | PRN

## 2012-12-26 MED ORDER — HYDROCODONE-HOMATROPINE 5-1.5 MG/5ML PO SYRP: 5.0000 mL | ORAL_SOLUTION | Freq: Three times a day (TID) | ORAL | Status: DC | PRN

## 2012-12-26 NOTE — Progress Notes (Signed)
Patient scheduled for TKA with Dr. Lequita Halt. Dec 22. She is having surgery under epidural anesthesia. She has had real difficulty with previous surgery- renal failure. She has had nephrology review. She tells me that nephrology has ok'd her for surgery. I am unable to locate those records.  She has had a recent cough- has used hydrocodone cough syrup with good results. No fever or SOB  DM-  Lab Results  Component Value Date   HGBA1C 7.2* 04/09/2012  she needs f/u  htn- no home bps

## 2012-12-26 NOTE — Progress Notes (Signed)
Pre-visit discussion using our clinic review tool. No additional management support is needed unless otherwise documented below in the visit note.  

## 2012-12-28 DIAGNOSIS — M1710 Unilateral primary osteoarthritis, unspecified knee: Secondary | ICD-10-CM | POA: Insufficient documentation

## 2012-12-28 DIAGNOSIS — M179 Osteoarthritis of knee, unspecified: Secondary | ICD-10-CM | POA: Insufficient documentation

## 2012-12-28 NOTE — Assessment & Plan Note (Signed)
She has endstage OA of knee and is scheduled for surgery.  She is at high risk but is willing to accept all risks including renal failure and death. She states that knee pain is unbearable.

## 2012-12-28 NOTE — Assessment & Plan Note (Signed)
She needs aggressive weight loss Check labs today.  Continue same meds

## 2012-12-29 LAB — MICROALBUMIN / CREATININE URINE RATIO
Creatinine,U: 64.7 mg/dL
Microalb, Ur: 1.3 mg/dL (ref 0.0–1.9)

## 2012-12-30 ENCOUNTER — Other Ambulatory Visit: Payer: Self-pay | Admitting: Orthopedic Surgery

## 2012-12-30 NOTE — Progress Notes (Signed)
Preoperative surgical orders have been place into the Epic hospital system for [name] on 12/30/2012, 1:11 PM  by Patrica Duel for surgery on 01-19-13.  Preop Total Knee orders including Experal, PO Tylenol, and IV Decadron as long as there are no contraindications to the above medications. Avel Peace, PA-C

## 2012-12-31 ENCOUNTER — Ambulatory Visit: Payer: BC Managed Care – PPO | Admitting: Internal Medicine

## 2013-01-05 ENCOUNTER — Ambulatory Visit: Payer: BC Managed Care – PPO | Admitting: Internal Medicine

## 2013-01-08 ENCOUNTER — Encounter (HOSPITAL_COMMUNITY): Payer: Self-pay | Admitting: Pharmacy Technician

## 2013-01-12 ENCOUNTER — Encounter (HOSPITAL_COMMUNITY): Payer: Self-pay

## 2013-01-12 ENCOUNTER — Ambulatory Visit (HOSPITAL_COMMUNITY)
Admission: RE | Admit: 2013-01-12 | Discharge: 2013-01-12 | Disposition: A | Discharge status: Home / Self Care | Payer: BC Managed Care – PPO | Source: Ambulatory Visit | Attending: Orthopedic Surgery | Admitting: Orthopedic Surgery

## 2013-01-12 ENCOUNTER — Encounter (HOSPITAL_COMMUNITY)
Admission: RE | Admit: 2013-01-12 | Discharge: 2013-01-12 | Disposition: A | Discharge status: Home / Self Care | Payer: BC Managed Care – PPO | Source: Ambulatory Visit | Attending: Orthopedic Surgery | Admitting: Orthopedic Surgery

## 2013-01-12 ENCOUNTER — Encounter (INDEPENDENT_AMBULATORY_CARE_PROVIDER_SITE_OTHER): Payer: Self-pay

## 2013-01-12 DIAGNOSIS — M47814 Spondylosis without myelopathy or radiculopathy, thoracic region: Secondary | ICD-10-CM | POA: Insufficient documentation

## 2013-01-12 DIAGNOSIS — Z01818 Encounter for other preprocedural examination: Secondary | ICD-10-CM | POA: Insufficient documentation

## 2013-01-12 DIAGNOSIS — Z0183 Encounter for blood typing: Secondary | ICD-10-CM | POA: Insufficient documentation

## 2013-01-12 DIAGNOSIS — J984 Other disorders of lung: Secondary | ICD-10-CM | POA: Insufficient documentation

## 2013-01-12 DIAGNOSIS — Z01812 Encounter for preprocedural laboratory examination: Secondary | ICD-10-CM | POA: Insufficient documentation

## 2013-01-12 DIAGNOSIS — E119 Type 2 diabetes mellitus without complications: Secondary | ICD-10-CM | POA: Insufficient documentation

## 2013-01-12 DIAGNOSIS — M1710 Unilateral primary osteoarthritis, unspecified knee: Secondary | ICD-10-CM | POA: Insufficient documentation

## 2013-01-12 HISTORY — DX: Personal history of other diseases of the respiratory system: Z87.09

## 2013-01-12 HISTORY — DX: Unspecified complication of internal orthopedic prosthetic device, implant and graft, initial encounter: T84.9XXA

## 2013-01-12 HISTORY — DX: Personal history of sudden cardiac arrest: Z86.74

## 2013-01-12 HISTORY — DX: Umbilical hernia without obstruction or gangrene: K42.9

## 2013-01-12 HISTORY — DX: Cerebral infarction, unspecified: I63.9

## 2013-01-12 HISTORY — DX: Polyneuropathy, unspecified: G62.9

## 2013-01-12 HISTORY — DX: Adverse effect of unspecified anesthetic, initial encounter: T41.45XA

## 2013-01-12 HISTORY — DX: Personal history of other diseases of the digestive system: Z87.19

## 2013-01-12 HISTORY — DX: Low back pain: M54.5

## 2013-01-12 HISTORY — DX: Unspecified osteoarthritis, unspecified site: M19.90

## 2013-01-12 HISTORY — DX: Pneumonia, unspecified organism: J18.9

## 2013-01-12 HISTORY — DX: Low back pain, unspecified: M54.50

## 2013-01-12 HISTORY — DX: Personal history of other infectious and parasitic diseases: Z86.19

## 2013-01-12 HISTORY — DX: Other complications of anesthesia, initial encounter: T88.59XA

## 2013-01-12 LAB — URINALYSIS, ROUTINE W REFLEX MICROSCOPIC
Hgb urine dipstick: NEGATIVE
Ketones, ur: NEGATIVE mg/dL
Nitrite: NEGATIVE
Protein, ur: NEGATIVE mg/dL
Specific Gravity, Urine: 1.006 (ref 1.005–1.030)
Urobilinogen, UA: 0.2 mg/dL (ref 0.0–1.0)

## 2013-01-12 LAB — SURGICAL PCR SCREEN
MRSA, PCR: NEGATIVE
Staphylococcus aureus: NEGATIVE

## 2013-01-12 LAB — COMPREHENSIVE METABOLIC PANEL
ALT: 33 U/L (ref 0–35)
Albumin: 3.9 g/dL (ref 3.5–5.2)
Alkaline Phosphatase: 87 U/L (ref 39–117)
CO2: 24 mEq/L (ref 19–32)
Calcium: 10 mg/dL (ref 8.4–10.5)
Potassium: 4.1 mEq/L (ref 3.5–5.1)
Sodium: 137 mEq/L (ref 135–145)
Total Protein: 8 g/dL (ref 6.0–8.3)

## 2013-01-12 LAB — URINE MICROSCOPIC-ADD ON

## 2013-01-12 NOTE — Patient Instructions (Signed)
20 MAKINA SKOW  01/12/2013   Your procedure is scheduled on: 01/19/13  Report to [facility] at 6:30 AM.  Call this number if you have problems the morning of surgery [phone]: [phone]   Remember:   Do not eat food or drink liquids After Midnight.     Take these medicines the morning of surgery with A SIP OF WATER: prilosec if needed   Do not wear jewelry, make-up or nail polish.  Do not wear lotions, powders, or perfumes. You may wear deodorant.  Do not shave 48 hours prior to surgery. Men may shave face and neck.  Do not bring valuables to the hospital.  Contacts, dentures or bridgework may not be worn into surgery.  Leave suitcase in the car. After surgery it may be brought to your room.  For patients admitted to the hospital, checkout time is 11:00 AM the day of discharge.    Please read over the following fact sheets that you were given: MRSA Information, blood fact sheet, incentive spirometry fact sheet Birdie Sons, RN  pre op nurse call if needed [phone]    FAILURE TO FOLLOW THESE INSTRUCTIONS MAY RESULT IN CANCELLATION OF YOUR SURGERY   Patient Signature: ___________________________________________

## 2013-01-12 NOTE — Progress Notes (Addendum)
Surgery clearance note Dr. Delton See on chart, surgery clearance note 11/13/12 Dr. Cato Mulligan on chart, discharge note 2006 on chart, EKG 12/01/12 on EPIC, CBC with diff 12/26/12 on EPIC

## 2013-01-12 NOTE — Anesthesia Preprocedure Evaluation (Addendum)
Anesthesia Evaluation  Patient identified by MRN, date of birth, ID band Patient awake    Reviewed: Allergy & Precautions, H&P , NPO status , Patient's Chart, lab work & pertinent test results  Airway Mallampati: II TM Distance: >3 FB Neck ROM: full    Dental no notable dental hx. (+) Dental Advisory Given and Teeth Intact   Pulmonary neg pulmonary ROS,  breath sounds clear to auscultation  Pulmonary exam normal       Cardiovascular hypertension, Pt. 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 No residual CVA, No Residual Symptoms negative neurological ROS  negative psych ROS   GI/Hepatic negative GI ROS, Neg liver ROS, GERD-  Medicated and Controlled,  Endo/Other  diabetes, Well Controlled, Type 2, Oral Hypoglycemic Agents and Insulin DependentMorbid obesity  Renal/GU ARFRenal diseaseHistory ARF after other knee replacement  negative genitourinary   Musculoskeletal   Abdominal (+) + obese,   Peds  Hematology negative hematology ROS (+)   Anesthesia Other Findings Cardiac arrest with IV contrast  Reproductive/Obstetrics negative OB ROS                          Anesthesia Physical Anesthesia Plan  ASA: III  Anesthesia Plan: Spinal   Post-op Pain Management:    Induction:   Airway Management Planned: Simple Face Mask  Additional Equipment:   Intra-op Plan:   Post-operative Plan:   Informed Consent: I have reviewed the patients History and Physical, chart, labs and discussed the procedure including the risks, benefits and alternatives for the proposed anesthesia with the patient or authorized representative who has indicated his/her understanding and acceptance.   Dental Advisory Given  Plan Discussed with: CRNA and Surgeon  Anesthesia Plan Comments: (Discussed  risks/benefits of spinal including headache, backache, failure, bleeding, infection, and nerve damage. Patient consents to spinal. Questions answered. Coagulation studies and platelet count acceptable.)       Anesthesia Quick Evaluation

## 2013-01-13 NOTE — Progress Notes (Addendum)
Abnormal chest x-ray faxed to Dr. Lequita Halt on 01/12/13 via [facility]. Chest x-ray showed to Dr. Leta Jungling with anesthesia. Chest x-ray to be repeated on day of surgery per Dr. Leta Jungling. [facility] called x3 with no response. Called [place] and she confirmed that Dr. Lequita Halt had seen chest x-ray.  The x-ray was fine and will continue with surgery as planned.

## 2013-01-14 LAB — URINE CULTURE

## 2013-01-18 ENCOUNTER — Other Ambulatory Visit: Payer: Self-pay | Admitting: Orthopedic Surgery

## 2013-01-18 MED ORDER — DEXTROSE 5 % IV SOLN
3.0000 g | INTRAVENOUS | Status: AC
  Administered 2013-01-19: 3 g via INTRAVENOUS
  Filled 2013-01-18: qty 3000

## 2013-01-18 NOTE — H&P (Signed)
[name]  DOB: 06/06/1945 Married / Language: English / Race: White Female  Date of Admission:  01-19-2013  Chief Complaint:  Right Knee Pain  History of Present Illness The patient is a 67 year old female who comes in for a preoperative History and Physical. The patient is scheduled for a right total knee arthroplasty to be performed by Dr. Gus Rankin. Aluisio, MD at [facility] on 01-19-2013. The patient is a 67 year old female who presents for follow up of their knee. The patient is being followed for their right knee pain and osteoarthritis. They are now several month(s) out from Synvisc series. Symptoms reported today include: pain and swelling (would like to do a cortisone injection today to get her through until she is able to repeat Synvisc in November). The following medication has been used for pain control: Hydrocodone. The patient said the knee is getting progressively worse over time. It has essentially taken over her life.She had a horrible time with the left knee. She went into renal failure and has had that happen before with general anesthetics. She would like to proceed with the right knee due to the continued pain. They have been treated conservatively in the past for the above stated problem and despite conservative measures, they continue to have progressive pain and severe functional limitations and dysfunction. They have failed non-operative management including home exercise, medications, and injections. It is felt that they would benefit from undergoing total joint replacement. Risks and benefits of the procedure have been discussed with the patient and they elect to proceed with surgery. There are no active contraindications to surgery such as ongoing infection or rapidly progressive neurological disease.   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 History of Hemm. CVA Bronchitis. Past History Pneumonia. Past History Gastroesophageal Reflux Disease Hemorrhoids Urinary Incontinence. Past Sprak Procedure Measles Mumps Rubella Peripheral Edema Menopause   Allergies Paxil *ANTIDEPRESSANTS* Dilaudid *ANALGESICS - OPIOID* IVP Dye   Family History Heart Disease. Father. father Cancer. mother, grandmother mothers side and grandmother fathers side Cerebrovascular Accident. father, grandmother mothers side, grandfather mothers side and grandmother fathers side Diabetes Mellitus. mother Heart disease in female family member before age 82 Hypertension. mother, father, sister, brother and grandmother mothers side    Social History Pain Contract. no Living situation. live with spouse Illicit drug use. no Children. 3 Number of flights of stairs before winded. 1 Copy of Drug/Alcohol Rehab (Previously). no Marital status. married Current work status. retired Exercise. Exercises daily; does other Drug/Alcohol Rehab (Currently). no Post-Surgical Plans. Plan is for home. Advance Directives. Living Will, Healthcare POA   Past Surgical History Tonsillectomy Total Knee Replacement. left Arthroscopy of Knee. bilateral Hysterectomy. Date: 31. partial (non-cancerous)   Review of Systems General:Not Present- Chills, Fever, Night Sweats, Fatigue, Weight Gain, Weight Loss and Memory Loss. Skin:Not Present- Hives, Itching, Rash, Eczema and Lesions. HEENT:Not Present- Tinnitus, Headache, Double Vision, Visual Loss, Hearing Loss and Dentures. Respiratory:Not Present- Shortness of breath with exertion, Shortness of breath at rest, Allergies, Coughing up blood and Chronic Cough. Cardiovascular:Not Present- Chest Pain, Racing/skipping heartbeats, Difficulty Breathing  Lying Down, Murmur, Swelling and Palpitations. Gastrointestinal:Not Present- Bloody Stool, Heartburn, Abdominal Pain, Vomiting, Nausea, Constipation, Diarrhea, Difficulty Swallowing, Jaundice and Loss of appetitie. Female Genitourinary:Not Present- Blood in Urine, Urinary frequency, Weak urinary stream, Discharge, Flank Pain, Incontinence, Painful Urination,  Urgency, Urinary Retention and Urinating at Night. Musculoskeletal:Present- Joint Pain. Not Present- Muscle Weakness, Muscle Pain, Joint Swelling, Back Pain, Morning Stiffness and Spasms. Neurological:Not Present- Tremor, Dizziness, Blackout spells, Paralysis, Difficulty with balance and Weakness. Psychiatric:Not Present- Insomnia.    Vitals Height: 64 in Height was reported by patient. Pulse: 106 (Regular) Resp.: 16 (Unlabored) BP: 146/70 (Sitting, Right Arm, Standard)     Physical Exam The physical exam findings are as follows:   General Mental Status - Alert, cooperative and good historian. General Appearance- pleasant. Not in acute distress. Orientation- Oriented X3. Build & Nutrition- Well nourished and Well developed.   Head and Neck Head- normocephalic, atraumatic . Neck Global Assessment- supple. no bruit auscultated on the right and no bruit auscultated on the left.   Eye Pupil- Bilateral- Regular and Round. Motion- Bilateral- EOMI.   Chest and Lung Exam Auscultation: Breath sounds:- clear at anterior chest wall and - clear at posterior chest wall. Adventitious sounds:- No Adventitious sounds.   Cardiovascular Auscultation:Rhythm- Regular rate and rhythm. Heart Sounds- S1 WNL and S2 WNL. Murmurs & Other Heart Sounds:Auscultation of the heart reveals - No Murmurs.   Abdomen Inspection:Contour- Generalized moderate distention. Palpation/Percussion:Tenderness- Abdomen is non-tender to palpation. Rigidity (guarding)- Abdomen is soft. Auscultation:Auscultation  of the abdomen reveals - Bowel sounds normal.   Female Genitourinary Not done, not pertinent to present illness  Musculoskeletal Right Lower Extremity: Right Knee: Evaluation of related systems reveals - well developed, well nourished and in no acute distress and alert and oriented X3. Inspection and Palpation: Swelling - none. Effusion - none. Skin - Color - no ecchymosis and no erythema. Right knee nontender to palpation. No effusion or instability. 5 to 95   Assessment & Plan Primary osteoarthritis of one knee (715.16) Impression: Right Knee  Note: Plan is for a Right Total Knee Replacement by Dr. Lequita Halt.  Plan is to go home with family  PCP - Dr. Cato Mulligan - Patient has been seen preoperatively and felt to be stable for surgery.  The patient will not receive TXA (tranexamic acid) due to: History of Hemmorrhagic Stroke  Signed electronically by Lauraine Rinne, III PA-C

## 2013-01-19 ENCOUNTER — Encounter (HOSPITAL_COMMUNITY): Payer: Self-pay | Admitting: *Deleted

## 2013-01-19 ENCOUNTER — Encounter (HOSPITAL_COMMUNITY): Payer: BC Managed Care – PPO | Admitting: Anesthesiology

## 2013-01-19 ENCOUNTER — Inpatient Hospital Stay (HOSPITAL_COMMUNITY): Payer: BC Managed Care – PPO

## 2013-01-19 ENCOUNTER — Inpatient Hospital Stay (HOSPITAL_COMMUNITY)
Admission: RE | Admit: 2013-01-19 | Discharge: 2013-01-21 | DRG: 470 | Disposition: A | Discharge status: Home Health | Payer: BC Managed Care – PPO | Source: Ambulatory Visit | Attending: Orthopedic Surgery | Admitting: Orthopedic Surgery

## 2013-01-19 ENCOUNTER — Encounter (HOSPITAL_COMMUNITY)
Admission: RE | Disposition: A | Discharge status: Home Health | Payer: Self-pay | Source: Ambulatory Visit | Attending: Orthopedic Surgery

## 2013-01-19 ENCOUNTER — Inpatient Hospital Stay (HOSPITAL_COMMUNITY): Payer: BC Managed Care – PPO | Admitting: Anesthesiology

## 2013-01-19 DIAGNOSIS — Z8673 Personal history of transient ischemic attack (TIA), and cerebral infarction without residual deficits: Secondary | ICD-10-CM

## 2013-01-19 DIAGNOSIS — Z96651 Presence of right artificial knee joint: Secondary | ICD-10-CM

## 2013-01-19 DIAGNOSIS — Z96659 Presence of unspecified artificial knee joint: Secondary | ICD-10-CM

## 2013-01-19 DIAGNOSIS — Z8674 Personal history of sudden cardiac arrest: Secondary | ICD-10-CM

## 2013-01-19 DIAGNOSIS — Z823 Family history of stroke: Secondary | ICD-10-CM

## 2013-01-19 DIAGNOSIS — Z8249 Family history of ischemic heart disease and other diseases of the circulatory system: Secondary | ICD-10-CM

## 2013-01-19 DIAGNOSIS — E109 Type 1 diabetes mellitus without complications: Secondary | ICD-10-CM | POA: Diagnosis present

## 2013-01-19 DIAGNOSIS — K219 Gastro-esophageal reflux disease without esophagitis: Secondary | ICD-10-CM | POA: Diagnosis present

## 2013-01-19 DIAGNOSIS — I129 Hypertensive chronic kidney disease with stage 1 through stage 4 chronic kidney disease, or unspecified chronic kidney disease: Secondary | ICD-10-CM | POA: Diagnosis present

## 2013-01-19 DIAGNOSIS — N189 Chronic kidney disease, unspecified: Secondary | ICD-10-CM | POA: Diagnosis present

## 2013-01-19 DIAGNOSIS — Z833 Family history of diabetes mellitus: Secondary | ICD-10-CM

## 2013-01-19 DIAGNOSIS — G609 Hereditary and idiopathic neuropathy, unspecified: Secondary | ICD-10-CM | POA: Diagnosis present

## 2013-01-19 DIAGNOSIS — E871 Hypo-osmolality and hyponatremia: Secondary | ICD-10-CM

## 2013-01-19 DIAGNOSIS — Z794 Long term (current) use of insulin: Secondary | ICD-10-CM

## 2013-01-19 DIAGNOSIS — Z6841 Body Mass Index (BMI) 40.0 and over, adult: Secondary | ICD-10-CM

## 2013-01-19 DIAGNOSIS — M1710 Unilateral primary osteoarthritis, unspecified knee: Secondary | ICD-10-CM

## 2013-01-19 DIAGNOSIS — D62 Acute posthemorrhagic anemia: Secondary | ICD-10-CM | POA: Diagnosis not present

## 2013-01-19 HISTORY — PX: TOTAL KNEE ARTHROPLASTY: SHX125

## 2013-01-19 LAB — TYPE AND SCREEN: Antibody Screen: NEGATIVE

## 2013-01-19 LAB — GLUCOSE, CAPILLARY

## 2013-01-19 SURGERY — ARTHROPLASTY, KNEE, TOTAL
Anesthesia: Spinal | Site: Knee | Laterality: Right

## 2013-01-19 MED ORDER — SODIUM CHLORIDE 0.9 % IV SOLN
INTRAVENOUS | Status: DC
  Administered 2013-01-19: 13:00:00 via INTRAVENOUS

## 2013-01-19 MED ORDER — ACETAMINOPHEN 650 MG RE SUPP: 650.0000 mg | Freq: Four times a day (QID) | RECTAL | Status: DC | PRN

## 2013-01-19 MED ORDER — BUPIVACAINE LIPOSOME 1.3 % IJ SUSP
20.0000 mL | Freq: Once | INTRAMUSCULAR | Status: DC
  Filled 2013-01-19: qty 20

## 2013-01-19 MED ORDER — TRAMADOL HCL 50 MG PO TABS: 50.0000 mg | ORAL_TABLET | Freq: Four times a day (QID) | ORAL | Status: DC | PRN

## 2013-01-19 MED ORDER — DOCUSATE SODIUM 100 MG PO CAPS
100.0000 mg | ORAL_CAPSULE | Freq: Two times a day (BID) | ORAL | Status: DC
  Administered 2013-01-19 – 2013-01-21 (×5): 100 mg via ORAL

## 2013-01-19 MED ORDER — NEOSTIGMINE METHYLSULFATE 1 MG/ML IJ SOLN
INTRAMUSCULAR | Status: AC
  Filled 2013-01-19: qty 10

## 2013-01-19 MED ORDER — ONDANSETRON HCL 4 MG PO TABS: 4.0000 mg | ORAL_TABLET | Freq: Four times a day (QID) | ORAL | Status: DC | PRN

## 2013-01-19 MED ORDER — ACETAMINOPHEN 325 MG PO TABS: 650.0000 mg | ORAL_TABLET | Freq: Four times a day (QID) | ORAL | Status: DC | PRN

## 2013-01-19 MED ORDER — INSULIN ASPART PROT & ASPART (70-30 MIX) 100 UNIT/ML ~~LOC~~ SUSP
23.0000 [IU] | Freq: Two times a day (BID) | SUBCUTANEOUS | Status: DC
  Administered 2013-01-20 – 2013-01-21 (×3): 23 [IU] via SUBCUTANEOUS
  Filled 2013-01-19: qty 10

## 2013-01-19 MED ORDER — HYDROCORTISONE 2.5 % RE CREA
1.0000 "application " | TOPICAL_CREAM | RECTAL | Status: DC | PRN
  Filled 2013-01-19: qty 28.35

## 2013-01-19 MED ORDER — OXYCODONE HCL 5 MG PO TABS
5.0000 mg | ORAL_TABLET | ORAL | Status: DC | PRN
  Administered 2013-01-19 – 2013-01-21 (×12): 10 mg via ORAL
  Filled 2013-01-19 (×12): qty 2

## 2013-01-19 MED ORDER — DEXAMETHASONE 6 MG PO TABS
10.0000 mg | ORAL_TABLET | Freq: Every day | ORAL | Status: AC
  Administered 2013-01-20: 10:00:00 10 mg via ORAL
  Filled 2013-01-19: qty 1

## 2013-01-19 MED ORDER — BUPIVACAINE HCL (PF) 0.25 % IJ SOLN
INTRAMUSCULAR | Status: AC
  Filled 2013-01-19: qty 30

## 2013-01-19 MED ORDER — LIDOCAINE HCL (CARDIAC) 20 MG/ML IV SOLN
INTRAVENOUS | Status: AC
  Filled 2013-01-19: qty 5

## 2013-01-19 MED ORDER — ONDANSETRON HCL 4 MG/2ML IJ SOLN: 4.0000 mg | Freq: Four times a day (QID) | INTRAMUSCULAR | Status: DC | PRN

## 2013-01-19 MED ORDER — STERILE WATER FOR IRRIGATION IR SOLN
Status: DC | PRN
  Administered 2013-01-19: 1500 mL

## 2013-01-19 MED ORDER — BUPIVACAINE HCL (PF) 0.75 % IJ SOLN
INTRAMUSCULAR | Status: DC | PRN
  Administered 2013-01-19: 2 mL

## 2013-01-19 MED ORDER — PROPOFOL 10 MG/ML IV BOLUS
INTRAVENOUS | Status: DC | PRN
  Administered 2013-01-19 (×3): 20 mg via INTRAVENOUS
  Administered 2013-01-19: 10 mg via INTRAVENOUS

## 2013-01-19 MED ORDER — PHENYLEPHRINE HCL 10 MG/ML IJ SOLN
INTRAMUSCULAR | Status: DC | PRN
  Administered 2013-01-19 (×2): 80 ug via INTRAVENOUS
  Administered 2013-01-19: 40 ug via INTRAVENOUS
  Administered 2013-01-19 (×2): 80 ug via INTRAVENOUS

## 2013-01-19 MED ORDER — POLYETHYLENE GLYCOL 3350 17 G PO PACK: 17.0000 g | PACK | Freq: Every day | ORAL | Status: DC | PRN

## 2013-01-19 MED ORDER — MORPHINE SULFATE 2 MG/ML IJ SOLN
1.0000 mg | INTRAMUSCULAR | Status: DC | PRN
  Administered 2013-01-19: 20:00:00 1 mg via INTRAVENOUS
  Administered 2013-01-19: 15:00:00 2 mg via INTRAVENOUS
  Filled 2013-01-19 (×3): qty 1

## 2013-01-19 MED ORDER — ACETAMINOPHEN 500 MG PO TABS
1000.0000 mg | ORAL_TABLET | Freq: Once | ORAL | Status: AC
  Administered 2013-01-19: 1000 mg via ORAL
  Filled 2013-01-19: qty 2

## 2013-01-19 MED ORDER — DIPHENHYDRAMINE HCL 12.5 MG/5ML PO ELIX: 12.5000 mg | ORAL_SOLUTION | ORAL | Status: DC | PRN

## 2013-01-19 MED ORDER — BUPIVACAINE IN DEXTROSE 0.75-8.25 % IT SOLN
INTRATHECAL | Status: DC | PRN
  Administered 2013-01-19: 2 mL via INTRATHECAL

## 2013-01-19 MED ORDER — FUROSEMIDE 40 MG PO TABS
40.0000 mg | ORAL_TABLET | Freq: Two times a day (BID) | ORAL | Status: DC
  Administered 2013-01-19 – 2013-01-20 (×2): 40 mg via ORAL
  Filled 2013-01-19 (×6): qty 1

## 2013-01-19 MED ORDER — BISACODYL 10 MG RE SUPP: 10.0000 mg | Freq: Every day | RECTAL | Status: DC | PRN

## 2013-01-19 MED ORDER — POTASSIUM CHLORIDE CRYS ER 20 MEQ PO TBCR
20.0000 meq | EXTENDED_RELEASE_TABLET | Freq: Two times a day (BID) | ORAL | Status: DC
  Administered 2013-01-19 – 2013-01-20 (×3): 20 meq via ORAL
  Filled 2013-01-19 (×6): qty 1

## 2013-01-19 MED ORDER — PHENOL 1.4 % MT LIQD
1.0000 | OROMUCOSAL | Status: DC | PRN
  Filled 2013-01-19: qty 177

## 2013-01-19 MED ORDER — BUPIVACAINE LIPOSOME 1.3 % IJ SUSP
INTRAMUSCULAR | Status: DC | PRN
  Administered 2013-01-19: 20 mL

## 2013-01-19 MED ORDER — MENTHOL 3 MG MT LOZG
1.0000 | LOZENGE | OROMUCOSAL | Status: DC | PRN
  Filled 2013-01-19: qty 9

## 2013-01-19 MED ORDER — METHOCARBAMOL 100 MG/ML IJ SOLN
500.0000 mg | Freq: Four times a day (QID) | INTRAMUSCULAR | Status: DC | PRN
  Administered 2013-01-19: 500 mg via INTRAVENOUS
  Filled 2013-01-19: qty 5

## 2013-01-19 MED ORDER — FLEET ENEMA 7-19 GM/118ML RE ENEM: 1.0000 | ENEMA | Freq: Once | RECTAL | Status: AC | PRN

## 2013-01-19 MED ORDER — ONDANSETRON HCL 4 MG/2ML IJ SOLN
INTRAMUSCULAR | Status: DC | PRN
  Administered 2013-01-19: 4 mg via INTRAVENOUS

## 2013-01-19 MED ORDER — DEXAMETHASONE SODIUM PHOSPHATE 10 MG/ML IJ SOLN
10.0000 mg | Freq: Once | INTRAMUSCULAR | Status: AC
  Administered 2013-01-19: 10 mg via INTRAVENOUS

## 2013-01-19 MED ORDER — MIDAZOLAM HCL 2 MG/2ML IJ SOLN
INTRAMUSCULAR | Status: AC
  Filled 2013-01-19: qty 2

## 2013-01-19 MED ORDER — SODIUM CHLORIDE 0.9 % IJ SOLN
INTRAMUSCULAR | Status: AC
  Filled 2013-01-19: qty 50

## 2013-01-19 MED ORDER — EPHEDRINE SULFATE 50 MG/ML IJ SOLN
INTRAMUSCULAR | Status: DC | PRN
  Administered 2013-01-19: 10 mg via INTRAVENOUS

## 2013-01-19 MED ORDER — INSULIN ASPART 100 UNIT/ML ~~LOC~~ SOLN
11.0000 [IU] | Freq: Once | SUBCUTANEOUS | Status: AC
  Administered 2013-01-19: 23:00:00 11 [IU] via SUBCUTANEOUS

## 2013-01-19 MED ORDER — METHOCARBAMOL 500 MG PO TABS
500.0000 mg | ORAL_TABLET | Freq: Four times a day (QID) | ORAL | Status: DC | PRN
  Administered 2013-01-20 – 2013-01-21 (×5): 500 mg via ORAL
  Filled 2013-01-19 (×5): qty 1

## 2013-01-19 MED ORDER — METOCLOPRAMIDE HCL 10 MG PO TABS: 5.0000 mg | ORAL_TABLET | Freq: Three times a day (TID) | ORAL | Status: DC | PRN

## 2013-01-19 MED ORDER — PROPOFOL 10 MG/ML IV BOLUS
INTRAVENOUS | Status: AC
  Filled 2013-01-19: qty 20

## 2013-01-19 MED ORDER — FENTANYL CITRATE 0.05 MG/ML IJ SOLN
INTRAMUSCULAR | Status: AC
  Filled 2013-01-19: qty 5

## 2013-01-19 MED ORDER — METOCLOPRAMIDE HCL 5 MG/ML IJ SOLN: 5.0000 mg | Freq: Three times a day (TID) | INTRAMUSCULAR | Status: DC | PRN

## 2013-01-19 MED ORDER — ROCURONIUM BROMIDE 100 MG/10ML IV SOLN
INTRAVENOUS | Status: AC
  Filled 2013-01-19: qty 1

## 2013-01-19 MED ORDER — PROPOFOL INFUSION 10 MG/ML OPTIME
INTRAVENOUS | Status: DC | PRN
  Administered 2013-01-19: 25 ug/kg/min via INTRAVENOUS

## 2013-01-19 MED ORDER — DEXAMETHASONE SODIUM PHOSPHATE 10 MG/ML IJ SOLN
10.0000 mg | Freq: Every day | INTRAMUSCULAR | Status: AC
  Filled 2013-01-19: qty 1

## 2013-01-19 MED ORDER — FENTANYL CITRATE 0.05 MG/ML IJ SOLN
INTRAMUSCULAR | Status: DC | PRN
  Administered 2013-01-19: 50 ug via INTRAVENOUS

## 2013-01-19 MED ORDER — CEFAZOLIN SODIUM-DEXTROSE 2-3 GM-% IV SOLR
2.0000 g | Freq: Four times a day (QID) | INTRAVENOUS | Status: AC
  Administered 2013-01-19 (×2): 2 g via INTRAVENOUS
  Filled 2013-01-19 (×2): qty 50

## 2013-01-19 MED ORDER — SODIUM CHLORIDE 0.9 % IV SOLN: INTRAVENOUS | Status: DC

## 2013-01-19 MED ORDER — MIDAZOLAM HCL 5 MG/5ML IJ SOLN
INTRAMUSCULAR | Status: DC | PRN
  Administered 2013-01-19: 2 mg via INTRAVENOUS

## 2013-01-19 MED ORDER — 0.9 % SODIUM CHLORIDE (POUR BTL) OPTIME
TOPICAL | Status: DC | PRN
  Administered 2013-01-19: 1000 mL

## 2013-01-19 MED ORDER — VALACYCLOVIR HCL 500 MG PO TABS
500.0000 mg | ORAL_TABLET | ORAL | Status: DC | PRN
  Filled 2013-01-19: qty 1

## 2013-01-19 MED ORDER — PANTOPRAZOLE SODIUM 40 MG PO TBEC
40.0000 mg | DELAYED_RELEASE_TABLET | Freq: Every day | ORAL | Status: DC
  Administered 2013-01-19: 19:00:00 40 mg via ORAL
  Filled 2013-01-19 (×3): qty 1

## 2013-01-19 MED ORDER — LACTATED RINGERS IV SOLN: INTRAVENOUS | Status: DC

## 2013-01-19 MED ORDER — GLYCOPYRROLATE 0.2 MG/ML IJ SOLN
INTRAMUSCULAR | Status: AC
  Filled 2013-01-19: qty 3

## 2013-01-19 MED ORDER — RIVAROXABAN 10 MG PO TABS
10.0000 mg | ORAL_TABLET | Freq: Every day | ORAL | Status: DC
  Administered 2013-01-20 – 2013-01-21 (×2): 10 mg via ORAL
  Filled 2013-01-19 (×3): qty 1

## 2013-01-19 MED ORDER — BUPIVACAINE HCL 0.25 % IJ SOLN
INTRAMUSCULAR | Status: DC | PRN
  Administered 2013-01-19: 20 mL

## 2013-01-19 MED ORDER — SUCCINYLCHOLINE CHLORIDE 20 MG/ML IJ SOLN
INTRAMUSCULAR | Status: AC
  Filled 2013-01-19: qty 1

## 2013-01-19 MED ORDER — SODIUM CHLORIDE 0.9 % IJ SOLN
INTRAMUSCULAR | Status: DC | PRN
  Administered 2013-01-19: 30 mL

## 2013-01-19 MED ORDER — SODIUM CHLORIDE 0.9 % IR SOLN
Status: DC | PRN
  Administered 2013-01-19: 1000 mL

## 2013-01-19 MED ORDER — LACTATED RINGERS IV SOLN
INTRAVENOUS | Status: DC | PRN
  Administered 2013-01-19 (×2): via INTRAVENOUS

## 2013-01-19 MED ORDER — ONDANSETRON HCL 4 MG/2ML IJ SOLN
INTRAMUSCULAR | Status: AC
  Filled 2013-01-19: qty 2

## 2013-01-19 MED ORDER — IRBESARTAN 300 MG PO TABS
300.0000 mg | ORAL_TABLET | Freq: Every day | ORAL | Status: DC
  Administered 2013-01-19 – 2013-01-21 (×3): 300 mg via ORAL
  Filled 2013-01-19 (×3): qty 1

## 2013-01-19 MED ORDER — INSULIN ASPART 100 UNIT/ML ~~LOC~~ SOLN
0.0000 [IU] | Freq: Three times a day (TID) | SUBCUTANEOUS | Status: DC
  Administered 2013-01-19 – 2013-01-20 (×2): 8 [IU] via SUBCUTANEOUS
  Administered 2013-01-20: 3 [IU] via SUBCUTANEOUS

## 2013-01-19 MED ORDER — HYDROMORPHONE HCL PF 1 MG/ML IJ SOLN: 0.2500 mg | INTRAMUSCULAR | Status: DC | PRN

## 2013-01-19 MED ORDER — ACETAMINOPHEN 500 MG PO TABS
1000.0000 mg | ORAL_TABLET | Freq: Four times a day (QID) | ORAL | Status: AC
  Administered 2013-01-19 – 2013-01-20 (×4): 1000 mg via ORAL
  Filled 2013-01-19 (×5): qty 2

## 2013-01-19 MED ORDER — DEXAMETHASONE SODIUM PHOSPHATE 10 MG/ML IJ SOLN
INTRAMUSCULAR | Status: AC
  Filled 2013-01-19: qty 1

## 2013-01-19 MED ORDER — GLIPIZIDE ER 10 MG PO TB24
10.0000 mg | ORAL_TABLET | Freq: Two times a day (BID) | ORAL | Status: DC
  Administered 2013-01-19 – 2013-01-21 (×4): 10 mg via ORAL
  Filled 2013-01-19 (×6): qty 1

## 2013-01-19 MED ORDER — METFORMIN HCL 500 MG PO TABS
1000.0000 mg | ORAL_TABLET | Freq: Two times a day (BID) | ORAL | Status: DC
  Administered 2013-01-19 – 2013-01-21 (×4): 1000 mg via ORAL
  Filled 2013-01-19 (×6): qty 2

## 2013-01-19 SURGICAL SUPPLY — 58 items
BAG SPEC THK2 15X12 ZIP CLS (MISCELLANEOUS) ×1
BAG ZIPLOCK 12X15 (MISCELLANEOUS) ×2 IMPLANT
BANDAGE ELASTIC 6 VELCRO ST LF (GAUZE/BANDAGES/DRESSINGS) ×2 IMPLANT
BANDAGE ESMARK 6X9 LF (GAUZE/BANDAGES/DRESSINGS) ×1 IMPLANT
BLADE SAG 18X100X1.27 (BLADE) ×2 IMPLANT
BLADE SAW SGTL 11.0X1.19X90.0M (BLADE) ×2 IMPLANT
BNDG CMPR 9X6 STRL LF SNTH (GAUZE/BANDAGES/DRESSINGS) ×1
BNDG ESMARK 6X9 LF (GAUZE/BANDAGES/DRESSINGS) ×2
BOWL SMART MIX CTS (DISPOSABLE) ×2 IMPLANT
CAPT RP KNEE ×1 IMPLANT
CEMENT HV SMART SET (Cement) ×4 IMPLANT
CUFF TOURN SGL QUICK 34 (TOURNIQUET CUFF) ×2
CUFF TRNQT CYL 34X4X40X1 (TOURNIQUET CUFF) ×1 IMPLANT
DECANTER SPIKE VIAL GLASS SM (MISCELLANEOUS) ×2 IMPLANT
DRAPE EXTREMITY T 121X128X90 (DRAPE) ×2 IMPLANT
DRAPE POUCH INSTRU U-SHP 10X18 (DRAPES) ×2 IMPLANT
DRAPE U-SHAPE 47X51 STRL (DRAPES) ×2 IMPLANT
DRSG ADAPTIC 3X8 NADH LF (GAUZE/BANDAGES/DRESSINGS) ×2 IMPLANT
DURAPREP 26ML APPLICATOR (WOUND CARE) ×2 IMPLANT
ELECT REM PT RETURN 9FT ADLT (ELECTROSURGICAL) ×2
ELECTRODE REM PT RTRN 9FT ADLT (ELECTROSURGICAL) ×1 IMPLANT
EVACUATOR 1/8 PVC DRAIN (DRAIN) ×2 IMPLANT
FACESHIELD LNG OPTICON STERILE (SAFETY) ×10 IMPLANT
GLOVE BIO SURGEON STRL SZ7.5 (GLOVE) IMPLANT
GLOVE BIO SURGEON STRL SZ8 (GLOVE) ×2 IMPLANT
GLOVE BIOGEL PI IND STRL 8 (GLOVE) ×2 IMPLANT
GLOVE BIOGEL PI INDICATOR 8 (GLOVE) ×2
GLOVE SURG SS PI 6.5 STRL IVOR (GLOVE) ×1 IMPLANT
GOWN PREVENTION PLUS LG XLONG (DISPOSABLE) ×2 IMPLANT
GOWN STRL REIN XL XLG (GOWN DISPOSABLE) IMPLANT
HANDPIECE INTERPULSE COAX TIP (DISPOSABLE) ×2
IMMOBILIZER KNEE 20 (SOFTGOODS) ×2
IMMOBILIZER KNEE 20 THIGH 36 (SOFTGOODS) ×1 IMPLANT
KIT BASIN OR (CUSTOM PROCEDURE TRAY) ×2 IMPLANT
MANIFOLD NEPTUNE II (INSTRUMENTS) ×2 IMPLANT
NDL SAFETY ECLIPSE 18X1.5 (NEEDLE) ×2 IMPLANT
NEEDLE HYPO 18GX1.5 SHARP (NEEDLE) ×4
NS IRRIG 1000ML POUR BTL (IV SOLUTION) ×2 IMPLANT
PACK TOTAL JOINT (CUSTOM PROCEDURE TRAY) ×2 IMPLANT
PAD ABD 8X10 STRL (GAUZE/BANDAGES/DRESSINGS) ×2 IMPLANT
PADDING CAST ABS 6INX4YD NS (CAST SUPPLIES) ×1
PADDING CAST ABS COTTON 6X4 NS (CAST SUPPLIES) IMPLANT
PADDING CAST COTTON 6X4 STRL (CAST SUPPLIES) ×6 IMPLANT
POSITIONER SURGICAL ARM (MISCELLANEOUS) ×2 IMPLANT
SET HNDPC FAN SPRY TIP SCT (DISPOSABLE) ×1 IMPLANT
SPONGE GAUZE 4X4 12PLY (GAUZE/BANDAGES/DRESSINGS) ×2 IMPLANT
STRIP CLOSURE SKIN 1/2X4 (GAUZE/BANDAGES/DRESSINGS) ×4 IMPLANT
SUCTION FRAZIER 12FR DISP (SUCTIONS) ×2 IMPLANT
SUT MNCRL AB 4-0 PS2 18 (SUTURE) ×2 IMPLANT
SUT VIC AB 2-0 CT1 27 (SUTURE) ×12
SUT VIC AB 2-0 CT1 TAPERPNT 27 (SUTURE) ×3 IMPLANT
SUT VLOC 180 0 24IN GS25 (SUTURE) ×2 IMPLANT
SYR 20CC LL (SYRINGE) ×2 IMPLANT
SYR 50ML LL SCALE MARK (SYRINGE) ×2 IMPLANT
TOWEL OR 17X26 10 PK STRL BLUE (TOWEL DISPOSABLE) ×4 IMPLANT
TRAY FOLEY CATH 14FRSI W/METER (CATHETERS) ×2 IMPLANT
WATER STERILE IRR 1500ML POUR (IV SOLUTION) ×2 IMPLANT
WRAP KNEE MAXI GEL POST OP (GAUZE/BANDAGES/DRESSINGS) ×2 IMPLANT

## 2013-01-19 NOTE — H&P (View-Only) (Signed)
[name]  DOB: 01/24/1946 Married / Language: English / Race: White Female  Date of Admission:  01-19-2013  Chief Complaint:  Right Knee Pain  History of Present Illness The patient is a 67 year old female who comes in for a preoperative History and Physical. The patient is scheduled for a right total knee arthroplasty to be performed by Dr. Frank V. Aluisio, MD at [facility] on 01-19-2013. The patient is a 66 year old female who presents for follow up of their knee. The patient is being followed for their right knee pain and osteoarthritis. They are now several month(s) out from Synvisc series. Symptoms reported today include: pain and swelling (would like to do a cortisone injection today to get her through until she is able to repeat Synvisc in November). The following medication has been used for pain control: Hydrocodone. The patient said the knee is getting progressively worse over time. It has essentially taken over her life.She had a horrible time with the left knee. She went into renal failure and has had that happen before with general anesthetics. She would like to proceed with the right knee due to the continued pain. They have been treated conservatively in the past for the above stated problem and despite conservative measures, they continue to have progressive pain and severe functional limitations and dysfunction. They have failed non-operative management including home exercise, medications, and injections. It is felt that they would benefit from undergoing total joint replacement. Risks and benefits of the procedure have been discussed with the patient and they elect to proceed with surgery. There are no active contraindications to surgery such as ongoing infection or rapidly progressive neurological disease.   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 History of Hemm. CVA Bronchitis. Past History Pneumonia. Past History Gastroesophageal Reflux Disease Hemorrhoids Urinary Incontinence. Past Sprak Procedure Measles Mumps Rubella Peripheral Edema Menopause   Allergies Paxil *ANTIDEPRESSANTS* Dilaudid *ANALGESICS - OPIOID* IVP Dye   Family History Heart Disease. Father. father Cancer. mother, grandmother mothers side and grandmother fathers side Cerebrovascular Accident. father, grandmother mothers side, grandfather mothers side and grandmother fathers side Diabetes Mellitus. mother Heart disease in female family member before age 55 Hypertension. mother, father, sister, brother and grandmother mothers side    Social History Pain Contract. no Living situation. live with spouse Illicit drug use. no Children. 3 Number of flights of stairs before winded. 1 Copy of Drug/Alcohol Rehab (Previously). no Marital status. married Current work status. retired Exercise. Exercises daily; does other Drug/Alcohol Rehab (Currently). no Post-Surgical Plans. Plan is for home. Advance Directives. Living Will, Healthcare POA   Past Surgical History Tonsillectomy Total Knee Replacement. left Arthroscopy of Knee. bilateral Hysterectomy. Date: 1993. partial (non-cancerous)   Review of Systems General:Not Present- Chills, Fever, Night Sweats, Fatigue, Weight Gain, Weight Loss and Memory Loss. Skin:Not Present- Hives, Itching, Rash, Eczema and Lesions. HEENT:Not Present- Tinnitus, Headache, Double Vision, Visual Loss, Hearing Loss and Dentures. Respiratory:Not Present- Shortness of breath with exertion, Shortness of breath at rest, Allergies, Coughing up blood and Chronic Cough. Cardiovascular:Not Present- Chest Pain, Racing/skipping heartbeats, Difficulty Breathing  Lying Down, Murmur, Swelling and Palpitations. Gastrointestinal:Not Present- Bloody Stool, Heartburn, Abdominal Pain, Vomiting, Nausea, Constipation, Diarrhea, Difficulty Swallowing, Jaundice and Loss of appetitie. Female Genitourinary:Not Present- Blood in Urine, Urinary frequency, Weak urinary stream, Discharge, Flank Pain, Incontinence, Painful Urination,   Urgency, Urinary Retention and Urinating at Night. Musculoskeletal:Present- Joint Pain. Not Present- Muscle Weakness, Muscle Pain, Joint Swelling, Back Pain, Morning Stiffness and Spasms. Neurological:Not Present- Tremor, Dizziness, Blackout spells, Paralysis, Difficulty with balance and Weakness. Psychiatric:Not Present- Insomnia.    Vitals Height: 64 in Height was reported by patient. Pulse: 106 (Regular) Resp.: 16 (Unlabored) BP: 146/70 (Sitting, Right Arm, Standard)     Physical Exam The physical exam findings are as follows:   General Mental Status - Alert, cooperative and good historian. General Appearance- pleasant. Not in acute distress. Orientation- Oriented X3. Build & Nutrition- Well nourished and Well developed.   Head and Neck Head- normocephalic, atraumatic . Neck Global Assessment- supple. no bruit auscultated on the right and no bruit auscultated on the left.   Eye Pupil- Bilateral- Regular and Round. Motion- Bilateral- EOMI.   Chest and Lung Exam Auscultation: Breath sounds:- clear at anterior chest wall and - clear at posterior chest wall. Adventitious sounds:- No Adventitious sounds.   Cardiovascular Auscultation:Rhythm- Regular rate and rhythm. Heart Sounds- S1 WNL and S2 WNL. Murmurs & Other Heart Sounds:Auscultation of the heart reveals - No Murmurs.   Abdomen Inspection:Contour- Generalized moderate distention. Palpation/Percussion:Tenderness- Abdomen is non-tender to palpation. Rigidity (guarding)- Abdomen is soft. Auscultation:Auscultation  of the abdomen reveals - Bowel sounds normal.   Female Genitourinary Not done, not pertinent to present illness  Musculoskeletal Right Lower Extremity: Right Knee: Evaluation of related systems reveals - well developed, well nourished and in no acute distress and alert and oriented X3. Inspection and Palpation: Swelling - none. Effusion - none. Skin - Color - no ecchymosis and no erythema. Right knee nontender to palpation. No effusion or instability. 5 to 95   Assessment & Plan Primary osteoarthritis of one knee (715.16) Impression: Right Knee  Note: Plan is for a Right Total Knee Replacement by Dr. Aluisio.  Plan is to go home with family  PCP - Dr. Swords - Patient has been seen preoperatively and felt to be stable for surgery.  The patient will not receive TXA (tranexamic acid) due to: History of Hemmorrhagic Stroke  Signed electronically by Alexzandrew L Perkins, III PA-C 

## 2013-01-19 NOTE — Interval H&P Note (Signed)
History and Physical Interval Note:  01/19/2013 7:17 AM  [name]  has presented today for surgery, with the diagnosis of OSTEOARTHRITIS RIGHT KNEE  The various methods of treatment have been discussed with the patient and family. After consideration of risks, benefits and other options for treatment, the patient has consented to  Procedure(s): RIGHT TOTAL KNEE ARTHROPLASTY (Right) as a surgical intervention .  The patient's history has been reviewed, patient examined, no change in status, stable for surgery.  I have reviewed the patient's chart and labs.  Questions were answered to the patient's satisfaction.     Loanne Drilling

## 2013-01-19 NOTE — Op Note (Signed)
Pre-operative diagnosis- Osteoarthritis  Right knee(s)  Post-operative diagnosis- Osteoarthritis Right knee(s)  Procedure-  Right  Total Knee Arthroplasty  Surgeon- Gus Rankin. Osa Fogarty, MD  Assistant- Dimitri Ped, PA-C   Anesthesia-  Spinal EBL-* No blood loss amount entered *  Drains Hemovac  Tourniquet time-  Total Tourniquet Time Documented: Thigh (Right) - 40 minutes Total: Thigh (Right) - 40 minutes    Complications- None  Condition-PACU - hemodynamically stable.   Brief Clinical Note  [name] is a 67 y.o. year old female with end stage OA of her right knee with progressively worsening pain and dysfunction. She has constant pain, with activity and at rest and significant functional deficits with difficulties even with ADLs. She has had extensive non-op management including analgesics, injections of cortisone and viscosupplements, and home exercise program, but remains in significant pain with significant dysfunction.Radiographs show bone on bone arthritis all 3 compartmetns. She presents now for right Total Knee Arthroplasty.    Procedure in detail---   The patient is brought into the operating room and positioned supine on the operating table. After successful administration of  Spinal,   a tourniquet is placed high on the  Right thigh(s) and the lower extremity is prepped and draped in the usual sterile fashion. Time out is performed by the operating team and then the  Right lower extremity is wrapped in Esmarch, knee flexed and the tourniquet inflated to 300 mmHg.       A midline incision is made with a ten blade through the subcutaneous tissue to the level of the extensor mechanism. A fresh blade is used to make a medial parapatellar arthrotomy. Soft tissue over the proximal medial tibia is subperiosteally elevated to the joint line with a knife and into the semimembranosus bursa with a Cobb elevator. Soft tissue over the proximal lateral tibia is elevated with attention  being paid to avoiding the patellar tendon on the tibial tubercle. The patella is everted, knee flexed 90 degrees and the ACL and PCL are removed. Findings are bone on bone arthritis all 3 compartments with large global osteophytes.        The drill is used to create a starting hole in the distal femur and the canal is thoroughly irrigated with sterile saline to remove the fatty contents. The 5 degree Right  valgus alignment guide is placed into the femoral canal and the distal femoral cutting block is pinned to remove 10 mm off the distal femur. Resection is made with an oscillating saw.      The tibia is subluxed forward and the menisci are removed. The extramedullary alignment guide is placed referencing proximally at the medial aspect of the tibial tubercle and distally along the second metatarsal axis and tibial crest. The block is pinned to remove 2mm off the more deficient medial  side. Resection is made with an oscillating saw. Size 3is the most appropriate size for the tibia and the proximal tibia is prepared with the modular drill and keel punch for that size.      The femoral sizing guide is placed and size 3 is most appropriate. Rotation is marked off the epicondylar axis and confirmed by creating a rectangular flexion gap at 90 degrees. The size 3 cutting block is pinned in this rotation and the anterior, posterior and chamfer cuts are made with the oscillating saw. The intercondylar block is then placed and that cut is made.      Trial size 3 tibial component, trial size 3  posterior stabilized femur and a 10  mm posterior stabilized rotating platform insert trial is placed. Full extension is achieved with excellent varus/valgus and anterior/posterior balance throughout full range of motion. The patella is everted and thickness measured to be 22  mm. Free hand resection is taken to 12 mm, a 38 template is placed, lug holes are drilled, trial patella is placed, and it tracks normally. Osteophytes are  removed off the posterior femur with the trial in place. All trials are removed and the cut bone surfaces prepared with pulsatile lavage. Cement is mixed and once ready for implantation, the size 3 tibial implant, size  3 posterior stabilized femoral component, and the size 38 patella are cemented in place and the patella is held with the clamp. The trial insert is placed and the knee held in full extension. The Exparel (20 ml mixed with 30 ml saline) and .25% Bupivicaine, are injected into the extensor mechanism, posterior capsule, medial and lateral gutters and subcutaneous tissues.  All extruded cement is removed and once the cement is hard the permanent 10 mm posterior stabilized rotating platform insert is placed into the tibial tray.      The wound is copiously irrigated with saline solution and the extensor mechanism closed over a hemovac drain with #1 PDS suture. The tourniquet is released for a total tourniquet time of 40  minutes. Flexion against gravity is 140 degrees and the patella tracks normally. Subcutaneous tissue is closed with 2.0 vicryl and subcuticular with running 4.0 Monocryl. The incision is cleaned and dried and steri-strips and a bulky sterile dressing are applied. The limb is placed into a knee immobilizer and the patient is awakened and transported to recovery in stable condition.      Please note that a surgical assistant was a medical necessity for this procedure in order to perform it in a safe and expeditious manner. Surgical assistant was necessary to retract the ligaments and vital neurovascular structures to prevent injury to them and also necessary for proper positioning of the limb to allow for anatomic placement of the prosthesis.   Gus Rankin Paula Helming, MD    01/19/2013, 10:54 AM

## 2013-01-19 NOTE — Anesthesia Procedure Notes (Signed)
Spinal  Patient location during procedure: OR Staffing Anesthesiologist: Cecil Vandyke J Performed by: anesthesiologist  Preanesthetic Checklist Completed: patient identified, site marked, surgical consent, pre-op evaluation, timeout performed, IV checked, risks and benefits discussed and monitors and equipment checked Spinal Block Patient position: sitting Prep: Betadine Patient monitoring: heart rate, continuous pulse ox and blood pressure Approach: midline Location: L3-4 Injection technique: single-shot Needle Needle type: Spinocan  Needle gauge: 22 G Needle length: 9 cm Additional Notes Expiration date of kit checked and confirmed. Patient tolerated procedure well, without complications. No paresthesia. CSF clear.     

## 2013-01-19 NOTE — Preoperative (Signed)
Beta Blockers   Reason not to administer Beta Blockers:Not Applicable 

## 2013-01-19 NOTE — Evaluation (Signed)
Physical Therapy Evaluation Patient Details Name: [name] MRN: [id number] DOB: Oct 28, 1945 Today's Date: 01/19/2013 Time: 4098-1191 PT Time Calculation (min): 31 min  PT Assessment / Plan / Recommendation History of Present Illness  R TKA  Clinical Impression  *Pt is s/p TKA resulting in the deficits listed below (see PT Problem List). ** Pt will benefit from skilled PT to increase their independence and safety with mobility to allow discharge to the venue listed below.  **    PT Assessment  Patient needs continued PT services    Follow Up Recommendations  Home health PT    Does the patient have the potential to tolerate intense rehabilitation      Barriers to Discharge        Equipment Recommendations  Rolling walker with 5" wheels    Recommendations for Other Services     Frequency 7X/week    Precautions / Restrictions Precautions Precautions: Knee Restrictions Weight Bearing Restrictions: No Other Position/Activity Restrictions: WBAT   Pertinent Vitals/Pain *7/10 R knee Premedicated, ice applied**      Mobility  Bed Mobility Bed Mobility: Supine to Sit Supine to Sit: 3: Mod assist;HOB elevated Details for Bed Mobility Assistance: HOB 50* per pt request 2* hernia, assist to raise trunk and support RLE Transfers Transfers: Sit to Stand;Stand to Sit Sit to Stand: 3: Mod assist;From bed;With upper extremity assist Stand to Sit: 4: Min assist;To chair/3-in-1;With upper extremity assist Details for Transfer Assistance: VCs hand placement, assist to rise Ambulation/Gait Ambulation/Gait Assistance: 4: Min guard Ambulation Distance (Feet): 55 Feet Assistive device: Rolling walker Gait Pattern: Step-to pattern;Decreased step length - right;Antalgic Gait velocity: decr General Gait Details: VCs sequencing, min/guard for safety    Exercises Total Joint Exercises Ankle Circles/Pumps: AROM;Both;10 reps Quad Sets: AROM;Right;5 reps;Supine Heel Slides:  AAROM;Right;10 reps;Supine Goniometric ROM: AAROM R knee 30* flexion, 0* extension   PT Diagnosis: Difficulty walking;Acute pain  PT Problem List: Decreased strength;Decreased range of motion;Decreased activity tolerance;Pain;Decreased mobility;Obesity PT Treatment Interventions: Stair training;Gait training;DME instruction;Functional mobility training;Therapeutic exercise;Therapeutic activities;Patient/family education     PT Goals(Current goals can be found in the care plan section) Acute Rehab PT Goals Patient Stated Goal: to walk and be able to lose weight PT Goal Formulation: With patient Time For Goal Achievement: 01/26/13 Potential to Achieve Goals: Good  Visit Information  Last PT Received On: 01/19/13 Assistance Needed: +1 History of Present Illness: R TKA       Prior Functioning  Home Living Family/patient expects to be discharged to:: Private residence Living Arrangements: Spouse/significant other Available Help at Discharge: Family Type of Home: House Home Access: Stairs to enter Secretary/administrator of Steps: 2 Home Layout: Two level;Able to live on main level with bedroom/bathroom Alternate Level Stairs-Number of Steps: has chair lift Home Equipment: Grab bars - toilet Additional Comments: handicapped height commode; used forearm crutch 2* 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 (pt reports foot drop just when tired) Prior Function Level of Independence: Independent with assistive device(s)    Cognition  Cognition Arousal/Alertness: Awake/alert Behavior During Therapy: WFL for tasks assessed/performed Overall Cognitive Status: Within Functional Limits for tasks assessed    Extremity/Trunk Assessment Upper Extremity Assessment Upper Extremity Assessment: Overall WFL for tasks assessed Lower Extremity Assessment Lower Extremity Assessment: RLE deficits/detail;LLE deficits/detail RLE Deficits / Details: unable to do SLR, ankle WNL, knee flexion AAROM 30*, ext  0* RLE Sensation: history of peripheral neuropathy LLE Deficits / Details: strength WNL LLE Sensation: history of peripheral neuropathy Cervical / Trunk Assessment Cervical /  Trunk Assessment: Normal   Balance    End of Session PT - End of Session Equipment Utilized During Treatment: Gait belt Activity Tolerance: Patient tolerated treatment well Patient left: in chair;with call bell/phone within reach Nurse Communication: Mobility status  GP     Ralene Bathe Kistler 01/19/2013, 6:07 PM [phone]

## 2013-01-19 NOTE — Anesthesia Postprocedure Evaluation (Signed)
  Anesthesia Post-op Note  Patient: [name]  Procedure(s) Performed: Procedure(s) (LRB): RIGHT TOTAL KNEE ARTHROPLASTY (Right)  Patient Location: PACU  Anesthesia Type: Spinal  Level of Consciousness: awake and alert   Airway and Oxygen Therapy: Patient Spontanous Breathing  Post-op Pain: mild  Post-op Assessment: Post-op Vital signs reviewed, Patient's Cardiovascular Status Stable, Respiratory Function Stable, Patent Airway and No signs of Nausea or vomiting  Last Vitals:  Filed Vitals:   01/19/13 1315  BP: 145/83  Pulse: 86  Temp: 36.3 C  Resp: 16    Post-op Vital Signs: stable   Complications: No apparent anesthesia complications

## 2013-01-19 NOTE — Transfer of Care (Addendum)
Immediate Anesthesia Transfer of Care Note  Patient: [name]  Procedure(s) Performed: Procedure(s) (LRB): RIGHT TOTAL KNEE ARTHROPLASTY (Right)  Patient Location: PACU  Anesthesia Type: Regional  Level of Consciousness: sedated, patient cooperative and responds to stimulation  Airway & Oxygen Therapy: Patient Spontanous Breathing and Patient connected to face mask oxgen  Post-op Assessment: Report given to PACU RN and Post -op Vital signs reviewed and stable  Post vital signs: Reviewed and stable  Complications: No apparent anesthesia complications

## 2013-01-20 LAB — CBC
MCH: 27.7 pg (ref 26.0–34.0)
MCHC: 32.8 g/dL (ref 30.0–36.0)
MCV: 84.4 fL (ref 78.0–100.0)
RBC: 3.58 MIL/uL — ABNORMAL LOW (ref 3.87–5.11)
RDW: 15.3 % (ref 11.5–15.5)

## 2013-01-20 LAB — BASIC METABOLIC PANEL
BUN: 17 mg/dL (ref 6–23)
Chloride: 99 mEq/L (ref 96–112)
Creatinine, Ser: 0.98 mg/dL (ref 0.50–1.10)
GFR calc Af Amer: 68 mL/min — ABNORMAL LOW (ref >90)
Glucose, Bld: 194 mg/dL — ABNORMAL HIGH (ref 70–99)

## 2013-01-20 LAB — GLUCOSE, CAPILLARY: Glucose-Capillary: 158 mg/dL — ABNORMAL HIGH (ref 70–99)

## 2013-01-20 MED ORDER — NON FORMULARY: 20.0000 mg | Freq: Every day | Status: DC

## 2013-01-20 MED ORDER — OMEPRAZOLE 20 MG PO CPDR
20.0000 mg | DELAYED_RELEASE_CAPSULE | Freq: Every day | ORAL | Status: DC
  Administered 2013-01-20 – 2013-01-21 (×2): 20 mg via ORAL
  Filled 2013-01-20 (×2): qty 1

## 2013-01-20 MED ORDER — POLYSACCHARIDE IRON COMPLEX 150 MG PO CAPS
150.0000 mg | ORAL_CAPSULE | Freq: Every day | ORAL | Status: DC
  Administered 2013-01-20 – 2013-01-21 (×2): 150 mg via ORAL
  Filled 2013-01-20 (×2): qty 1

## 2013-01-20 MED ORDER — INSULIN ASPART 100 UNIT/ML ~~LOC~~ SOLN
8.0000 [IU] | Freq: Once | SUBCUTANEOUS | Status: AC
  Administered 2013-01-20: 8 [IU] via SUBCUTANEOUS

## 2013-01-20 NOTE — Care Management Note (Signed)
  Page 2 of 2   01/20/2013     6:19:33 PM   CARE MANAGEMENT NOTE 01/20/2013  Patient:  [name], [name]   Account Number:  [id number]  Date Initiated:  01/20/2013  Documentation initiated by:  Colleen Can  Subjective/Objective Assessment:   dx total rt knee replacemnt     Action/Plan:   CM spoke wiith patient. Plans are for her to return to her home in [place] where spouse will be caregiver. She will need RW. has handicapped equipped toilet. Wants [facility] for HHpt services.   Anticipated DC Date:  01/20/2013   Anticipated DC Plan:  HOME W HOME HEALTH SERVICES      DC Planning Services  CM consult      PAC Choice  DURABLE MEDICAL EQUIPMENT  HOME HEALTH   Choice offered to / List presented to:  C-1 Patient      DME agency  [facility]     HH arranged  HH-2 PT      [facility] agency  [facility]   Status of service:  In process, will continue to follow Medicare Important Message given?   (If response is "NO", the following Medicare IM given date fields will be blank) Date Medicare IM given:   Date Additional Medicare IM given:    Discharge Disposition:    Per UR Regulation:    If discussed at Long Length of Stay Meetings, dates discussed:    Comments:  01/20/2013 Colleen Can BSN RN CCM [phone] [facility] can provide HHpt services. [facility] advised that patient will need RW.

## 2013-01-20 NOTE — Evaluation (Signed)
Occupational Therapy Evaluation Patient Details Name: [name] MRN: [id number] DOB: February 04, 1945 Today's Date: 01/20/2013 Time: 9629-5284 OT Time Calculation (min): 24 min  OT Assessment / Plan / Recommendation History of present illness R TKA   Clinical Impression   Pt at mod - min A level with LB ADLs and will have 24 hour assist/sup at home. Pt has previous knee surgery and has ADL A/E and DME at home. All education completed and no further acute OT services indicated at this time. Pt to continue with acute PT services for functional mobility safety    OT Assessment  Patient does not need any further OT services    Follow Up Recommendations  No OT follow up;Supervision/Assistance - 24 hour    Barriers to Discharge  none    Equipment Recommendations  None recommended by OT    Recommendations for Other Services    Frequency       Precautions / Restrictions Precautions Precautions: Knee Required Braces or Orthoses: Knee Immobilizer - Right Knee Immobilizer - Right: Discontinue once straight leg raise with < 10 degree lag Restrictions Weight Bearing Restrictions: No Other Position/Activity Restrictions: WBAT   Pertinent Vitals/Pain 6/10 R knee    ADL  Grooming: Performed;Wash/dry hands;Wash/dry face;Teeth care;Min guard;Supervision/safety;Set up Where Assessed - Grooming: Supported standing;Unsupported standing Upper Body Bathing: Supervision/safety;Set up Where Assessed - Upper Body Bathing: Unsupported sitting Lower Body Bathing: Simulated;Minimal assistance Upper Body Dressing: Performed;Supervision/safety;Set up Where Assessed - Upper Body Dressing: Unsupported sitting Lower Body Dressing: Performed;Moderate assistance Toilet Transfer: Performed Toilet Transfer Method: Sit to Barista: Raised toilet seat with arms (or 3-in-1 over toilet);Grab bars Toileting - Clothing Manipulation and Hygiene: Performed;Minimal assistance Where Assessed  - Engineer, mining and Hygiene: Standing Tub/Shower Transfer Method: Not assessed Equipment Used: Gait belt;Rolling walker;Other (comment) (3 in 1 over toilet) Transfers/Ambulation Related to ADLs: verbal cues for hand placement and R LE forward ADL Comments: Pt familiar with ADL A/E and DME and has at home from previous L knee surgery. Pt able to stand at sink with RW to brush teeth with SBA-mni guard A    OT Diagnosis:    OT Problem List:   OT Treatment Interventions:     OT Goals(Current goals can be found in the care plan section) Acute Rehab OT Goals Patient Stated Goal: to walk and be able to lose weight  Visit Information  Last OT Received On: 01/27/13 Assistance Needed: +1 History of Present Illness: R TKA       Prior Functioning     Home Living Family/patient expects to be discharged to:: Private residence Living Arrangements: Spouse/significant other Available Help at Discharge: Family Type of Home: House Home Access: Stairs to enter Secretary/administrator of Steps: 2 Home Layout: Two level;Able to live on main level with bedroom/bathroom Alternate Level Stairs-Number of Steps: has chair lift Home Equipment: Grab bars - toilet Additional Comments: handicapped height commode; used forearm crutch 2* 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 (pt reports foot drop just when tired) Prior Function Level of Independence: Independent with assistive device(s) Communication Communication: No difficulties Dominant Hand: Right         Vision/Perception Vision - History Baseline Vision: Wears glasses only for reading Patient Visual Report: No change from baseline Perception Perception: Within Functional Limits   Cognition  Cognition Arousal/Alertness: Awake/alert Behavior During Therapy: WFL for tasks assessed/performed Overall Cognitive Status: Within Functional Limits for tasks assessed    Extremity/Trunk Assessment Upper Extremity Assessment Upper  Extremity Assessment:  Overall WFL for tasks assessed Lower Extremity Assessment Lower Extremity Assessment: Defer to PT evaluation Cervical / Trunk Assessment Cervical / Trunk Assessment: Normal     Mobility Bed Mobility Bed Mobility: Sit to Supine;Sitting - Scoot to Edge of Bed Supine to Sit: 3: Mod assist;HOB elevated Sitting - Scoot to Edge of Bed: 5: Supervision Sit to Supine: 4: Min guard Details for Bed Mobility Assistance: pt educated in self assist of R LE onto bed using sheet Transfers Transfers: Sit to Stand;Stand to Sit Sit to Stand: 4: Min guard;With upper extremity assist;From bed;From chair/3-in-1 Stand to Sit: 4: Min guard;With upper extremity assist;To bed;To chair/3-in-1 Details for Transfer Assistance: verbal cues for hand placement and R LE forward        Balance Balance Balance Assessed: Yes Dynamic Sitting Balance Dynamic Sitting - Balance Support: No upper extremity supported;Feet unsupported;During functional activity Dynamic Sitting - Level of Assistance: 6: Modified independent (Device/Increase time) Static Standing Balance Static Standing - Balance Support: Right upper extremity supported;Left upper extremity supported;Bilateral upper extremity supported;No upper extremity supported;During functional activity Static Standing - Level of Assistance: 5: Stand by assistance Dynamic Standing Balance Dynamic Standing - Balance Support: Right upper extremity supported;Left upper extremity supported;Bilateral upper extremity supported;No upper extremity supported;During functional activity Dynamic Standing - Level of Assistance: 5: Stand by assistance;Other (comment) (min guard A)   End of Session OT - End of Session Equipment Utilized During Treatment: Gait belt;Rolling walker;Other (comment) (3 in 1) Activity Tolerance: Patient tolerated treatment well Patient left: in bed;with call bell/phone within reach CPM Right Knee CPM Right Knee: Off  GO      Margaretmary Eddy [facility] 01/20/2013, 2:46 PM

## 2013-01-20 NOTE — Progress Notes (Signed)
CSW met with pt this am to assist with d/c planning. Pt plans to return home following hospital d/c. Pt states she will have support at home and she is interested in [facility] services. Will alert RNCM.  Cori Razor LCSW [phone]

## 2013-01-20 NOTE — Progress Notes (Signed)
Physical Therapy Treatment Note   01/20/13 1431  PT Visit Information  Last PT Received On 01/20/13  Assistance Needed +1  PT Time Calculation  PT Start Time 1318  PT Stop Time 1346  PT Time Calculation (min) 28 min  Subjective Data  Subjective Pt ambulated short distance in hallway then assisted back to bed and performed exercises.  Pt reports d/c home tomorrow.   Will need to practice stairs prior to d/c.  Precautions  Precautions Knee  Required Braces or Orthoses Knee Immobilizer - Right  Knee Immobilizer - Right Discontinue once straight leg raise with < 10 degree lag  Restrictions  Other Position/Activity Restrictions WBAT  Cognition  Arousal/Alertness Awake/alert  Behavior During Therapy WFL for tasks assessed/performed  Overall Cognitive Status Within Functional Limits for tasks assessed  Bed Mobility  Bed Mobility Sit to Supine  Sit to Supine 4: Min guard  Details for Bed Mobility Assistance pt educated in self assist of R LE onto bed using sheet  Transfers  Transfers Sit to Stand;Stand to Sit  Sit to Stand 4: Min guard;With upper extremity assist;From chair/3-in-1  Stand to Sit 4: Min guard;With upper extremity assist;To bed  Details for Transfer Assistance verbal cues for hand placement and R LE forward  Ambulation/Gait  Ambulation/Gait Assistance 4: Min guard  Ambulation Distance (Feet) 60 Feet  Assistive device Rolling walker  Ambulation/Gait Assistance Details verbal cues for step length and RW distance  Gait Pattern Step-to pattern;Decreased step length - right;Antalgic  Gait velocity decr  Exercises  Exercises Total Joint  Total Joint Exercises  Ankle Circles/Pumps AROM;10 reps;Both  Quad Sets AROM;Both;10 reps  Heel Slides AAROM;Right;10 reps  Towel Squeeze AROM;Both;10 reps  Short [employer];Right;10 reps  Hip ABduction/ADduction AROM;Right;10 reps  Straight Leg Raises AAROM;Right;10 reps  PT - End of Session  Equipment Utilized During Treatment  Right knee immobilizer  Activity Tolerance Patient tolerated treatment well  Patient left in bed;with call bell/phone within reach (with OT)  PT - Assessment/Plan  PT Plan Current plan remains appropriate  PT Frequency 7X/week  Follow Up Recommendations Home health PT  PT equipment Rolling walker with 5" wheels  PT Goal Progression  Progress towards PT goals Progressing toward goals  PT General Charges  $$ ACUTE PT VISIT 1 Procedure  PT Treatments  $Gait Training 8-22 mins  $Therapeutic Exercise 8-22 mins   Zenovia Jarred, PT, DPT 01/20/2013 Pager: [phone]

## 2013-01-20 NOTE — Progress Notes (Signed)
Utilization review completed.  

## 2013-01-20 NOTE — Progress Notes (Signed)
Physical Therapy Treatment Patient Details Name: [name] MRN: [id number] DOB: 1945/08/29 Today's Date: 01/20/2013 Time: 4098-1191 PT Time Calculation (min): 26 min  PT Assessment / Plan / Recommendation  History of Present Illness     PT Comments   Pt ambulated short distance in hallway with occasional short standing rest breaks due to fatigue.  Pt reports increased pain after ambulation so repositioned and applied ice packs.  Pt aware of when to call out for pain meds and would like to wait to perform exercises after next dose of pain meds.   Follow Up Recommendations  Home health PT     Does the patient have the potential to tolerate intense rehabilitation     Barriers to Discharge        Equipment Recommendations  Rolling walker with 5" wheels    Recommendations for Other Services    Frequency 7X/week   Progress towards PT Goals Progress towards PT goals: Progressing toward goals  Plan Current plan remains appropriate    Precautions / Restrictions Precautions Precautions: Knee Required Braces or Orthoses: Knee Immobilizer - Right Knee Immobilizer - Right: Discontinue once straight leg raise with < 10 degree lag Restrictions Other Position/Activity Restrictions: WBAT   Pertinent Vitals/Pain 6/10 R knee upon entering room however stated felt better during ambulation, pt reported increase in pain upon return to recliner, ice packs applied     Mobility  Bed Mobility Bed Mobility: Not assessed Details for Bed Mobility Assistance: pt up in recliner upon arrival Transfers Transfers: Sit to Stand;Stand to Sit Sit to Stand: 4: Min assist;From chair/3-in-1;With upper extremity assist Stand to Sit: 4: Min guard;To chair/3-in-1;With upper extremity assist Details for Transfer Assistance: verbal cues for hand placement and R LE forward, pt also assisted to bathroom with BSC over toilet Ambulation/Gait Ambulation/Gait Assistance: 4: Min guard Ambulation Distance (Feet):  80 Feet Assistive device: Rolling walker Ambulation/Gait Assistance Details: verbal cues for sequence and step length Gait Pattern: Step-to pattern;Decreased step length - right;Antalgic Gait velocity: decr General Gait Details: a few short standing rest breaks required due to fatigue    Exercises     PT Diagnosis:    PT Problem List:   PT Treatment Interventions:     PT Goals (current goals can now be found in the care plan section)    Visit Information  Last PT Received On: 01/20/13 Assistance Needed: +1    Subjective Data      Cognition  Cognition Arousal/Alertness: Awake/alert Behavior During Therapy: [facility] for tasks assessed/performed Overall Cognitive Status: Within Functional Limits for tasks assessed    Balance     End of Session PT - End of Session Equipment Utilized During Treatment: Right knee immobilizer Activity Tolerance: Patient tolerated treatment well Patient left: in chair;with call bell/phone within reach;with family/visitor present   GP     Sera Hitsman,[name] 01/20/2013, 12:49 PM Zenovia Jarred, PT, DPT 01/20/2013 Pager: [phone]

## 2013-01-20 NOTE — Progress Notes (Signed)
Patient refused to wear SCDs and Ted stockings.

## 2013-01-20 NOTE — Progress Notes (Addendum)
Subjective: 1 Day Post-Op Procedure(s) (LRB): RIGHT TOTAL KNEE ARTHROPLASTY (Right) Patient reports pain as moderate.   Patient seen in rounds with Dr. Lequita Halt. Patient is well, but has had some minor complaints of pain in the knee, requiring pain medications We will start therapy today.  Plan is to go home but may not have family after hospital stay.  She may need to look into SNF if family in not home.  Objective: Vital signs in last 24 hours: Temp:  [96.4 F (35.8 C)-98.8 F (37.1 C)] 98.8 F (37.1 C) (12/23 0635) Pulse Rate:  [77-97] 80 (12/23 0635) Resp:  [16-20] 16 (12/23 0635) BP: (95-152)/(54-92) 148/62 mmHg (12/23 0635) SpO2:  [94 %-100 %] 94 % (12/23 0635) Weight:  [122.925 kg (271 lb)] 122.925 kg (271 lb) (12/22 1900)  Intake/Output from previous day:  Intake/Output Summary (Last 24 hours) at 01/20/13 0719 Last data filed at 01/20/13 1610  Gross per 24 hour  Intake 4386.25 ml  Output   3725 ml  Net 661.25 ml    Intake/Output this shift: UOP 1800  Labs:  Recent Labs  01/20/13 0529  HGB 9.9*    Recent Labs  01/20/13 0529  WBC 11.5*  RBC 3.58*  HCT 30.2*  PLT 309    Recent Labs  01/20/13 0529  NA 133*  K 4.4  CL 99  CO2 22  BUN 17  CREATININE 0.98  GLUCOSE 194*  CALCIUM 8.6   No results found for this basename: LABPT, INR,  in the last 72 hours  EXAM General - Patient is Alert, Appropriate and Oriented Extremity - Neurovascular intact Sensation intact distally Dorsiflexion/Plantar flexion intact Dressing - dressing C/D/I Motor Function - intact, moving foot and toes well on exam.  Hemovac pulled without difficulty.  Past Medical History  Diagnosis Date  . Diabetes mellitus   . Hypertension   . Arthritis   . Lower extremity edema   . Osteoarthritis   . Lumbar pain   . Complication of joint prosthesis     locks up and no PT after knee replacement  . Peripheral neuropathy     below ankle in both feet  . Renal failure    "after left knee replacement, fine now" some renal damage  . CVA (cerebral vascular accident) 2008    "small hemmoragic" right sided face drooping, no residual or follow up, can not confirmed because of MRI, questionable  . H/O bronchitis   . Pneumonia     h/o, vaccine given  . GERD (gastroesophageal reflux disease)     mild, occasional  . H/O hemorrhoids   . H/O measles   . H/O mumps   . H/O rubella   . Depression     very long time ago  . Umbilical hernia     "huge"  . Complication of anesthesia     after surgery: renal failure, hypotension, tachycardia, shock  . H/O cardiac arrest     from contrast dye    Assessment/Plan: 1 Day Post-Op Procedure(s) (LRB): RIGHT TOTAL KNEE ARTHROPLASTY (Right) Principal Problem:   OA (osteoarthritis) of knee  Estimated body mass index is 46.49 kg/(m^2) as calculated from the following:   Height as of this encounter: 5\' 4"  (1.626 m).   Weight as of this encounter: 122.925 kg (271 lb). Advance diet Up with therapy Plan for discharge tomorrow Discharge home with home health  DVT Prophylaxis - Xarelto Weight-Bearing as tolerated to right leg D/C O2 and Pulse OX and try on Room  Air  Addendum - Will get Child psychotherapist involved just in case family support falls through and she needs to go to SNF.  [name] 01/20/2013, 7:19 AM

## 2013-01-21 DIAGNOSIS — D62 Acute posthemorrhagic anemia: Secondary | ICD-10-CM

## 2013-01-21 DIAGNOSIS — E871 Hypo-osmolality and hyponatremia: Secondary | ICD-10-CM

## 2013-01-21 LAB — BASIC METABOLIC PANEL
BUN: 22 mg/dL (ref 6–23)
CO2: 24 mEq/L (ref 19–32)
Chloride: 99 mEq/L (ref 96–112)
Creatinine, Ser: 0.94 mg/dL (ref 0.50–1.10)
GFR calc Af Amer: 72 mL/min — ABNORMAL LOW (ref >90)
Glucose, Bld: 105 mg/dL — ABNORMAL HIGH (ref 70–99)
Potassium: 4 mEq/L (ref 3.5–5.1)

## 2013-01-21 LAB — GLUCOSE, CAPILLARY
Glucose-Capillary: 121 mg/dL — ABNORMAL HIGH (ref 70–99)
Glucose-Capillary: 267 mg/dL — ABNORMAL HIGH (ref 70–99)
Glucose-Capillary: 73 mg/dL (ref 70–99)

## 2013-01-21 LAB — CBC
HCT: 31.5 % — ABNORMAL LOW (ref 36.0–46.0)
Hemoglobin: 10.3 g/dL — ABNORMAL LOW (ref 12.0–15.0)
MCV: 85.4 fL (ref 78.0–100.0)
RBC: 3.69 MIL/uL — ABNORMAL LOW (ref 3.87–5.11)
WBC: 15 10*3/uL — ABNORMAL HIGH (ref 4.0–10.5)

## 2013-01-21 MED ORDER — METHOCARBAMOL 500 MG PO TABS: 500.0000 mg | ORAL_TABLET | Freq: Four times a day (QID) | ORAL | Status: DC | PRN

## 2013-01-21 MED ORDER — OXYCODONE HCL 5 MG PO TABS: 5.0000 mg | ORAL_TABLET | ORAL | Status: DC | PRN

## 2013-01-21 MED ORDER — TRAMADOL HCL 50 MG PO TABS: 50.0000 mg | ORAL_TABLET | Freq: Four times a day (QID) | ORAL | Status: DC | PRN

## 2013-01-21 MED ORDER — RIVAROXABAN 10 MG PO TABS: 10.0000 mg | ORAL_TABLET | Freq: Every day | ORAL | Status: DC

## 2013-01-21 NOTE — Progress Notes (Signed)
Subjective: 2 Days Post-Op Procedure(s) (LRB): RIGHT TOTAL KNEE ARTHROPLASTY (Right) Patient reports pain as mild.   Patient seen in rounds with Dr. Lequita Halt. Patient is well, and has had no acute complaints or problems Patient is ready to go home today  Objective: Vital signs in last 24 hours: Temp:  [97.9 F (36.6 C)-98.3 F (36.8 C)] 98.3 F (36.8 C) (12/24 0456) Pulse Rate:  [88-94] 92 (12/24 0456) Resp:  [16] 16 (12/24 0456) BP: (144-168)/(65-76) 154/76 mmHg (12/24 0456) SpO2:  [93 %-97 %] 96 % (12/24 0456)  Intake/Output from previous day:  Intake/Output Summary (Last 24 hours) at 01/21/13 0714 Last data filed at 01/21/13 0557  Gross per 24 hour  Intake 1082.5 ml  Output   2775 ml  Net -1692.5 ml    Intake/Output this shift:    Labs:  Recent Labs  01/20/13 0529 01/21/13 0557  HGB 9.9* 10.3*    Recent Labs  01/20/13 0529 01/21/13 0557  WBC 11.5* 15.0*  RBC 3.58* 3.69*  HCT 30.2* 31.5*  PLT 309 393    Recent Labs  01/20/13 0529 01/21/13 0557  NA 133* 133*  K 4.4 4.0  CL 99 99  CO2 22 24  BUN 17 22  CREATININE 0.98 0.94  GLUCOSE 194* 105*  CALCIUM 8.6 9.4   No results found for this basename: LABPT, INR,  in the last 72 hours  EXAM: General - Patient is Alert, Appropriate and Oriented Extremity - Neurovascular intact Sensation intact distally Dorsiflexion/Plantar flexion intact Incision - clean, dry, no drainage, healing Motor Function - intact, moving foot and toes well on exam.   Assessment/Plan: 2 Days Post-Op Procedure(s) (LRB): RIGHT TOTAL KNEE ARTHROPLASTY (Right) Procedure(s) (LRB): RIGHT TOTAL KNEE ARTHROPLASTY (Right) Past Medical History  Diagnosis Date  . Diabetes mellitus   . Hypertension   . Arthritis   . Lower extremity edema   . Osteoarthritis   . Lumbar pain   . Complication of joint prosthesis     locks up and no PT after knee replacement  . Peripheral neuropathy     below ankle in both feet  . Renal  failure     "after left knee replacement, fine now" some renal damage  . CVA (cerebral vascular accident) 2008    "small hemmoragic" right sided face drooping, no residual or follow up, can not confirmed because of MRI, questionable  . H/O bronchitis   . Pneumonia     h/o, vaccine given  . GERD (gastroesophageal reflux disease)     mild, occasional  . H/O hemorrhoids   . H/O measles   . H/O mumps   . H/O rubella   . Depression     very long time ago  . Umbilical hernia     "huge"  . Complication of anesthesia     after surgery: renal failure, hypotension, tachycardia, shock  . H/O cardiac arrest     from contrast dye   Principal Problem:   OA (osteoarthritis) of knee Active Problems:   Hyponatremia   Postoperative anemia due to acute blood loss  Estimated body mass index is 46.49 kg/(m^2) as calculated from the following:   Height as of this encounter: 5\' 4"  (1.626 m).   Weight as of this encounter: 122.925 kg (271 lb). Up with therapy Discharge home with home health Diet - Cardiac diet, Diabetic diet and Renal diet Follow up - in 2 weeks Activity - WBAT Disposition - Home Condition Upon Discharge - Good D/C Meds -  See DC Summary DVT Prophylaxis - Xarelto  Andras Grunewald 01/21/2013, 7:14 AM

## 2013-01-21 NOTE — Progress Notes (Signed)
Physical Therapy Treatment Patient Details Name: [name] MRN: [id number] DOB: 07/26/1945 Today's Date: 01/21/2013 Time: 1478-2956 PT Time Calculation (min): 28 min  PT Assessment / Plan / Recommendation  History of Present Illness     PT Comments   Pt ambulated in hallway, practiced steps, and performed exercises.  Pt has HEP handout.  Pt had no further questions/concerns and plans to d/c home today.   Follow Up Recommendations  Home health PT     Does the patient have the potential to tolerate intense rehabilitation     Barriers to Discharge        Equipment Recommendations  Rolling walker with 5" wheels    Recommendations for Other Services    Frequency 7X/week   Progress towards PT Goals Progress towards PT goals: Progressing toward goals  Plan Current plan remains appropriate    Precautions / Restrictions Precautions Precautions: Knee Required Braces or Orthoses: Knee Immobilizer - Right Knee Immobilizer - Right: Discontinue once straight leg raise with < 10 degree lag Restrictions Other Position/Activity Restrictions: WBAT   Pertinent Vitals/Pain Pt states pain is better today.  Ice packs applied    Mobility  Bed Mobility Bed Mobility: Not assessed Details for Bed Mobility Assistance: pt up in recliner upon arrival Transfers Transfers: Sit to Stand;Stand to Sit Sit to Stand: From chair/3-in-1;With upper extremity assist;4: Min guard Stand to Sit: 4: Min guard;To chair/3-in-1;With upper extremity assist Details for Transfer Assistance: verbal cues for hand placement and R LE forward, pt also assisted to bathroom with BSC over toilet (no assist required for positioned with toilet transfers) Ambulation/Gait Ambulation/Gait Assistance: 4: Min guard Ambulation Distance (Feet): 80 Feet Assistive device: Rolling walker Ambulation/Gait Assistance Details: slow but steady gait Gait Pattern: Step-to pattern;Decreased step length - right;Antalgic Gait velocity:  decr Stairs: Yes Stairs Assistance: 4: Min guard Stairs Assistance Details (indicate cue type and reason): verbal cues for sequence, pt reports she has grab bars near her stairs so used rails as substitute Stair Management Technique: Two rails;Step to pattern;Forwards Number of Stairs: 5 (2 up and 3 down)    Exercises Total Joint Exercises Ankle Circles/Pumps: AROM;10 reps;Both Quad Sets: AROM;Both;10 reps Short Arc QuadBarbaraann [name];Right;10 reps Heel Slides: AAROM;Right;10 reps;Seated Hip ABduction/ADduction: Right;10 reps;AAROM Straight Leg Raises: AAROM;Right;10 reps   PT Diagnosis:    PT Problem List:   PT Treatment Interventions:     PT Goals (current goals can now be found in the care plan section)    Visit Information  Last PT Received On: 01/21/13 Assistance Needed: +1    Subjective Data      Cognition  Cognition Arousal/Alertness: Awake/alert Behavior During Therapy: [facility] for tasks assessed/performed Overall Cognitive Status: Within Functional Limits for tasks assessed    Balance     End of Session PT - End of Session Equipment Utilized During Treatment: Right knee immobilizer Activity Tolerance: Patient tolerated treatment well Patient left: in chair;with call bell/phone within reach;with family/visitor present;with nursing/sitter in room   GP     [name],[name] 01/21/2013, 12:56 PM Zenovia Jarred, PT, DPT 01/21/2013 Pager: [phone]

## 2013-01-21 NOTE — Discharge Summary (Signed)
Physician Discharge Summary   Patient ID: [name] MRN: [id number] DOB/AGE: December 17, 1945 67 y.o.  Admit date: 01/19/2013 Discharge date: 01/21/2013  Primary Diagnosis:  Osteoarthritis Right knee(s)  Admission Diagnoses:  Past Medical History  Diagnosis Date  . Diabetes mellitus   . Hypertension   . Arthritis   . Lower extremity edema   . Osteoarthritis   . Lumbar pain   . Complication of joint prosthesis     locks up and no PT after knee replacement  . Peripheral neuropathy     below ankle in both feet  . Renal failure     "after left knee replacement, fine now" some renal damage  . CVA (cerebral vascular accident) 2008    "small hemmoragic" right sided face drooping, no residual or follow up, can not confirmed because of MRI, questionable  . H/O bronchitis   . Pneumonia     h/o, vaccine given  . GERD (gastroesophageal reflux disease)     mild, occasional  . H/O hemorrhoids   . H/O measles   . H/O mumps   . H/O rubella   . Depression     very long time ago  . Umbilical hernia     "huge"  . Complication of anesthesia     after surgery: renal failure, hypotension, tachycardia, shock  . H/O cardiac arrest     from contrast dye   Discharge Diagnoses:   Principal Problem:   OA (osteoarthritis) of knee Active Problems:   Hyponatremia   Postoperative anemia due to acute blood loss  Estimated body mass index is 46.49 kg/(m^2) as calculated from the following:   Height as of this encounter: 5\' 4"  (1.626 m).   Weight as of this encounter: 122.925 kg (271 lb).  Procedure:  Procedure(s) (LRB): RIGHT TOTAL KNEE ARTHROPLASTY (Right)   Consults: None  HPI: [name] is a 67 y.o. year old female with end stage OA of her right knee with progressively worsening pain and dysfunction. She has constant pain, with activity and at rest and significant functional deficits with difficulties even with ADLs. She has had extensive non-op management including analgesics,  injections of cortisone and viscosupplements, and home exercise program, but remains in significant pain with significant dysfunction.Radiographs show bone on bone arthritis all 3 compartmetns. She presents now for right Total Knee Arthroplasty.   Laboratory Data: Admission on 01/19/2013, Discharged on 01/21/2013  Component Date Value Range Status  . Glucose-Capillary 01/19/2013 141* 70 - 99 mg/dL Final  . Glucose-Capillary 01/19/2013 123* 70 - 99 mg/dL Final  . Comment 1 69/62/9528 Notify RN   Final  . WBC 01/20/2013 11.5* 4.0 - 10.5 K/uL Final  . RBC 01/20/2013 3.58* 3.87 - 5.11 MIL/uL Final  . Hemoglobin 01/20/2013 9.9* 12.0 - 15.0 g/dL Final  . HCT 41/32/4401 30.2* 36.0 - 46.0 % Final  . MCV 01/20/2013 84.4  78.0 - 100.0 fL Final  . MCH 01/20/2013 27.7  26.0 - 34.0 pg Final  . MCHC 01/20/2013 32.8  30.0 - 36.0 g/dL Final  . RDW 02/72/5366 15.3  11.5 - 15.5 % Final  . Platelets 01/20/2013 309  150 - 400 K/uL Final  . Sodium 01/20/2013 133* 135 - 145 mEq/L Final  . Potassium 01/20/2013 4.4  3.5 - 5.1 mEq/L Final  . Chloride 01/20/2013 99  96 - 112 mEq/L Final  . CO2 01/20/2013 22  19 - 32 mEq/L Final  . Glucose, Bld 01/20/2013 194* 70 - 99 mg/dL Final  . BUN  01/20/2013 17  6 - 23 mg/dL Final  . Creatinine, Ser 01/20/2013 0.98  0.50 - 1.10 mg/dL Final  . Calcium 16/10/9602 8.6  8.4 - 10.5 mg/dL Final  . GFR calc non Af Amer 01/20/2013 59* >90 mL/min Final  . GFR calc Af Amer 01/20/2013 68* >90 mL/min Final   Comment: (NOTE)                          The eGFR has been calculated using the CKD EPI equation.                          This calculation has not been validated in all clinical situations.                          eGFR's persistently <90 mL/min signify possible Chronic Kidney                          Disease.  . Glucose-Capillary 01/19/2013 288* 70 - 99 mg/dL Final  . Comment 1 54/09/8117 Notify RN   Final  . Glucose-Capillary 01/19/2013 336* 70 - 99 mg/dL Final  .  Glucose-Capillary 01/20/2013 158* 70 - 99 mg/dL Final  . Glucose-Capillary 01/20/2013 130* 70 - 99 mg/dL Final  . Glucose-Capillary 01/20/2013 292* 70 - 99 mg/dL Final  . Comment 1 14/78/2956 Notify RN   Final  . WBC 01/21/2013 15.0* 4.0 - 10.5 K/uL Final  . RBC 01/21/2013 3.69* 3.87 - 5.11 MIL/uL Final  . Hemoglobin 01/21/2013 10.3* 12.0 - 15.0 g/dL Final  . HCT 21/30/8657 31.5* 36.0 - 46.0 % Final  . MCV 01/21/2013 85.4  78.0 - 100.0 fL Final  . MCH 01/21/2013 27.9  26.0 - 34.0 pg Final  . MCHC 01/21/2013 32.7  30.0 - 36.0 g/dL Final  . RDW 84/69/6295 15.6* 11.5 - 15.5 % Final  . Platelets 01/21/2013 393  150 - 400 K/uL Final  . Sodium 01/21/2013 133* 135 - 145 mEq/L Final  . Potassium 01/21/2013 4.0  3.5 - 5.1 mEq/L Final  . Chloride 01/21/2013 99  96 - 112 mEq/L Final  . CO2 01/21/2013 24  19 - 32 mEq/L Final  . Glucose, Bld 01/21/2013 105* 70 - 99 mg/dL Final  . BUN 28/41/3244 22  6 - 23 mg/dL Final  . Creatinine, Ser 01/21/2013 0.94  0.50 - 1.10 mg/dL Final  . Calcium 01/31/7251 9.4  8.4 - 10.5 mg/dL Final  . GFR calc non Af Amer 01/21/2013 62* >90 mL/min Final  . GFR calc Af Amer 01/21/2013 72* >90 mL/min Final   Comment: (NOTE)                          The eGFR has been calculated using the CKD EPI equation.                          This calculation has not been validated in all clinical situations.                          eGFR's persistently <90 mL/min signify possible Chronic Kidney                          Disease.  Lorna Dibble 01/20/2013  303* 70 - 99 mg/dL Final  . Glucose-Capillary 01/21/2013 267* 70 - 99 mg/dL Final  . Glucose-Capillary 01/21/2013 73  70 - 99 mg/dL Final  . Glucose-Capillary 01/21/2013 121* 70 - 99 mg/dL Final  . Glucose-Capillary 01/21/2013 133* 70 - 99 mg/dL Final  Hospital Outpatient Visit on 01/12/2013  Component Date Value Range Status  . aPTT 01/12/2013 34  24 - 37 seconds Final  . Sodium 01/12/2013 137  135 - 145 mEq/L Final  .  Potassium 01/12/2013 4.1  3.5 - 5.1 mEq/L Final  . Chloride 01/12/2013 99  96 - 112 mEq/L Final  . CO2 01/12/2013 24  19 - 32 mEq/L Final  . Glucose, Bld 01/12/2013 114* 70 - 99 mg/dL Final  . BUN 16/10/9602 19  6 - 23 mg/dL Final  . Creatinine, Ser 01/12/2013 0.86  0.50 - 1.10 mg/dL Final  . Calcium 54/09/8117 10.0  8.4 - 10.5 mg/dL Final  . Total Protein 01/12/2013 8.0  6.0 - 8.3 g/dL Final  . Albumin 14/78/2956 3.9  3.5 - 5.2 g/dL Final  . AST 21/30/8657 33  0 - 37 U/L Final  . ALT 01/12/2013 33  0 - 35 U/L Final  . Alkaline Phosphatase 01/12/2013 87  39 - 117 U/L Final  . Total Bilirubin 01/12/2013 0.5  0.3 - 1.2 mg/dL Final  . GFR calc non Af Amer 01/12/2013 69* >90 mL/min Final  . GFR calc Af Amer 01/12/2013 80* >90 mL/min Final   Comment: (NOTE)                          The eGFR has been calculated using the CKD EPI equation.                          This calculation has not been validated in all clinical situations.                          eGFR's persistently <90 mL/min signify possible Chronic Kidney                          Disease.  Marland Kitchen Prothrombin Time 01/12/2013 13.4  11.6 - 15.2 seconds Final  . INR 01/12/2013 1.04  0.00 - 1.49 Final  . ABO/RH(D) 01/12/2013 B POS   Final  . Antibody Screen 01/12/2013 NEG   Final  . Sample Expiration 01/12/2013 01/22/2013   Final  . Color, Urine 01/12/2013 YELLOW  YELLOW Final  . APPearance 01/12/2013 CLEAR  CLEAR Final  . Specific Gravity, Urine 01/12/2013 1.006  1.005 - 1.030 Final  . pH 01/12/2013 5.5  5.0 - 8.0 Final  . Glucose, UA 01/12/2013 NEGATIVE  NEGATIVE mg/dL Final  . Hgb urine dipstick 01/12/2013 NEGATIVE  NEGATIVE Final  . Bilirubin Urine 01/12/2013 NEGATIVE  NEGATIVE Final  . Ketones, ur 01/12/2013 NEGATIVE  NEGATIVE mg/dL Final  . Protein, ur 84/69/6295 NEGATIVE  NEGATIVE mg/dL Final  . Urobilinogen, UA 01/12/2013 0.2  0.0 - 1.0 mg/dL Final  . Nitrite 28/41/3244 NEGATIVE  NEGATIVE Final  . Leukocytes, UA 01/12/2013  TRACE* NEGATIVE Final  . MRSA, PCR 01/12/2013 NEGATIVE  NEGATIVE Final  . Staphylococcus aureus 01/12/2013 NEGATIVE  NEGATIVE Final   Comment:  The Xpert SA Assay (FDA                          approved for NASAL specimens                          in patients over 50 years of age),                          is one component of                          a comprehensive surveillance                          program.  Test performance has                          been validated by [employer] for patients greater                          than or equal to 39 year old.                          It is not intended                          to diagnose infection nor to                          guide or monitor treatment.  . ABO/RH(D) 01/12/2013 B POS   Final  . Squamous Epithelial / LPF 01/12/2013 RARE  RARE Final  . WBC, UA 01/12/2013 7-10  <3 WBC/hpf Final  . RBC / HPF 01/12/2013 0-2  <3 RBC/hpf Final  . Bacteria, UA 01/12/2013 MANY* RARE Final  . Urine-Other 01/12/2013 MUCOUS PRESENT   Final  . Specimen Description 01/12/2013 URINE, RANDOM   Final  . Special Requests 01/12/2013 NONE   Final  . Culture  Setup Time 01/12/2013    Final                   Value:01/13/2013 02:42                         Performed at [facility]  . Colony Count 01/12/2013    Final                   Value:>=100,000 COLONIES/ML                         Performed at [facility]  . Culture 01/12/2013    Final                   Value:KLEBSIELLA PNEUMONIAE                         Performed at [facility]  . Report Status 01/12/2013 01/14/2013 FINAL   Final  . Organism ID, Bacteria 01/12/2013 KLEBSIELLA  PNEUMONIAE   Final  Office Visit on 12/26/2012  Component Date Value Range Status  . Hemoglobin A1C 12/26/2012 7.8* 4.6 - 6.5 % Final   Glycemic Control Guidelines for People with Diabetes:Non Diabetic:  <6%Goal of Therapy: <7%Additional  Action Suggested:  >8%   . Sodium 12/26/2012 134* 135 - 145 mEq/L Final  . Potassium 12/26/2012 4.0  3.5 - 5.1 mEq/L Final  . Chloride 12/26/2012 101  96 - 112 mEq/L Final  . CO2 12/26/2012 25  19 - 32 mEq/L Final  . Glucose, Bld 12/26/2012 192* 70 - 99 mg/dL Final  . BUN 16/10/9602 14  6 - 23 mg/dL Final  . Creatinine, Ser 12/26/2012 1.0  0.4 - 1.2 mg/dL Final  . Calcium 54/09/8117 8.9  8.4 - 10.5 mg/dL Final  . GFR 14/78/2956 61.78  >60.00 mL/min Final  . Cholesterol 12/26/2012 85  0 - 200 mg/dL Final   ATP III Classification       Desirable:  < 200 mg/dL               Borderline High:  200 - 239 mg/dL          High:  > = 213 mg/dL  . Triglycerides 12/26/2012 91.0  0.0 - 149.0 mg/dL Final   Normal:  <086 mg/dLBorderline High:  150 - 199 mg/dL  . HDL 12/26/2012 35.10* >39.00 mg/dL Final  . VLDL 57/84/6962 18.2  0.0 - 40.0 mg/dL Final  . LDL Cholesterol 12/26/2012 32  0 - 99 mg/dL Final  . Total CHOL/HDL Ratio 12/26/2012 2   Final                  Men          Women1/2 Average Risk     3.4          3.3Average Risk          5.0          4.42X Average Risk          9.6          7.13X Average Risk          15.0          11.0                      . Microalb, Ur 12/26/2012 1.3  0.0 - 1.9 mg/dL Final  . Creatinine,U 95/28/4132 64.7   Final  . Microalb Creat Ratio 12/26/2012 2.0  0.0 - 30.0 mg/g Final  . Total Bilirubin 12/26/2012 0.9  0.3 - 1.2 mg/dL Final  . Bilirubin, Direct 12/26/2012 0.2  0.0 - 0.3 mg/dL Final  . Alkaline Phosphatase 12/26/2012 67  39 - 117 U/L Final  . AST 12/26/2012 37  0 - 37 U/L Final  . ALT 12/26/2012 35  0 - 35 U/L Final  . Total Protein 12/26/2012 7.0  6.0 - 8.3 g/dL Final  . Albumin 44/01/270 3.3* 3.5 - 5.2 g/dL Final  . HM Diabetic Eye Exam 11/29/2012 11/29/12   Final  . HM Diabetic Foot Exam 12/26/2012 done   Final  . WBC 12/26/2012 9.1  4.5 - 10.5 K/uL Final  . RBC 12/26/2012 4.28  3.87 - 5.11 Mil/uL Final  . Hemoglobin 12/26/2012 11.8* 12.0 - 15.0 g/dL  Final  . HCT 53/66/4403 36.1  36.0 - 46.0 % Final  . MCV 12/26/2012 84.4  78.0 - 100.0 fl Final  . MCHC 12/26/2012 32.6  30.0 - 36.0  g/dL Final  . RDW 45/40/9811 16.0* 11.5 - 14.6 % Final  . Platelets 12/26/2012 379.0  150.0 - 400.0 K/uL Final  . Neutrophils Relative % 12/26/2012 69.0  43.0 - 77.0 % Final  . Lymphocytes Relative 12/26/2012 21.3  12.0 - 46.0 % Final  . Monocytes Relative 12/26/2012 7.5  3.0 - 12.0 % Final  . Eosinophils Relative 12/26/2012 1.6  0.0 - 5.0 % Final  . Basophils Relative 12/26/2012 0.6  0.0 - 3.0 % Final  . Neutro Abs 12/26/2012 6.3  1.4 - 7.7 K/uL Final  . Lymphs Abs 12/26/2012 1.9  0.7 - 4.0 K/uL Final  . Monocytes Absolute 12/26/2012 0.7  0.1 - 1.0 K/uL Final  . Eosinophils Absolute 12/26/2012 0.1  0.0 - 0.7 K/uL Final  . Basophils Absolute 12/26/2012 0.1  0.0 - 0.1 K/uL Final     X-Rays:Dg Chest 2 View  01/19/2013   CLINICAL DATA:  Preoperative respiratory evaluation prior to total knee arthroplasty. Follow-up right middle lobe atelectasis versus bronchopneumonia Current history of hypertension. Prior history of bronchitis.  EXAM: CHEST  2 VIEW  COMPARISON:  01/12/2013, 04/22/2011, 12/06/2004.  FINDINGS: Cardiomediastinal silhouette unremarkable and unchanged. Interval resolution of the focal opacity in the right middle lobe since the examination 1 week ago. Lungs now clear. Bronchovascular markings normal. Pulmonary vascularity normal. No visible pleural effusions. No pneumothorax. Degenerative changes and DISH involving the thoracic spine.  IMPRESSION: Resolution of right middle lobe atelectasis and/or pneumonia since the examination 1 week ago. No acute cardiopulmonary disease currently.   Electronically Signed   By: Hulan Saas M.D.   On: 01/19/2013 07:15   Dg Chest 2 View  01/12/2013   CLINICAL DATA:  Diabetes.  Prior granulomas disease.  EXAM: CHEST  2 VIEW  COMPARISON:  04/22/2011.  FINDINGS: Mediastinum and hilar structures are normal. Poor  inspiration. Mild right perihilar infiltrate cannot be excluded. Mild right middle lobe infiltrate may be present. Mild right middle lobe atelectasis appears present. Stable calcified nodule left upper lobe consistent with granuloma. Heart size normal. Pulmonary vascularity normal. Degenerative changes thoracic spine.  IMPRESSION: 1. Mild right middle lobe and perihilar infiltrate and/or atelectasis. Poor inspiration.  2. Stable calcified granuloma left upper lobe.   Electronically Signed   By: Maisie Fus  Register   On: 01/12/2013 15:49    EKG: Orders placed in visit on 12/01/12  . EKG 12-LEAD     Hospital Course: [name] is a 67 y.o. who was admitted to [facility]. They were brought to the operating room on 01/19/2013 and underwent Procedure(s): RIGHT TOTAL KNEE ARTHROPLASTY.  Patient tolerated the procedure well and was later transferred to the recovery room and then to the orthopaedic floor for postoperative care.  They were given PO and IV analgesics for pain control following their surgery.  They were given 24 hours of postoperative antibiotics of  Anti-infectives   Start     Dose/Rate Route Frequency Ordered Stop   01/19/13 1600  valACYclovir (VALTREX) tablet 500 mg  Status:  Discontinued     500 mg Oral As needed 01/19/13 1407 01/21/13 1451   01/19/13 1600  ceFAZolin (ANCEF) IVPB 2 g/50 mL premix     2 g 100 mL/hr over 30 Minutes Intravenous Every 6 hours 01/19/13 1407 01/19/13 2221   01/19/13 0600  ceFAZolin (ANCEF) 3 g in dextrose 5 % 50 mL IVPB     3 g 160 mL/hr over 30 Minutes Intravenous On call to O.R. 01/18/13 1706 01/19/13 9147  and started on DVT prophylaxis in the form of Xarelto.   PT and OT were ordered for total joint protocol.  Discharge planning consulted to help with postop disposition and equipment needs.  Patient had a tough night on the evening of surgery.  They started to get up OOB with therapy on day one. Hemovac drain was pulled without difficulty.   Continued to work with therapy into day two.  Dressing was changed on day two and the incision was healing well.  Patient was seen in rounds and was ready to go home later that same day.   Discharge Medications: Prior to Admission medications   Medication Sig Start Date End Date Taking? Authorizing Provider  BD INSULIN SYRINGE ULTRAFINE 31G X 5/16" 0.3 ML MISC USE AS DIRECTED TWICE DAILY. 03/14/12  Yes Lindley Magnus, MD  Biotin 1000 MCG tablet Take 1,000 mcg by mouth daily.    Yes Historical Provider, MD  Calcium Polycarbophil (FIBERCON PO) Take 1 tablet by mouth daily.    Yes Historical Provider, MD  Cholecalciferol (VITAMIN D-3) 1000 UNITS CAPS Take 1 capsule by mouth daily.    Yes Historical Provider, MD  docusate sodium (COLACE) 50 MG capsule Take 100 mg by mouth daily.    Yes Historical Provider, MD  FREESTYLE TEST STRIPS test strip as directed. 05/29/11  Yes Historical Provider, MD  furosemide (LASIX) 40 MG tablet Take 40 mg by mouth 2 (two) times daily.   Yes Historical Provider, MD  GLIPIZIDE XL 10 MG 24 hr tablet Take 1 tablet by mouth Twice daily. 05/29/11  Yes Historical Provider, MD  HYDROcodone-acetaminophen (NORCO/VICODIN) 5-325 MG per tablet Take 1 tablet by mouth 2 (two) times daily as needed for moderate pain or severe pain. 12/26/12  Yes Bruce Romilda Garret, MD  HYDROcodone-homatropine (HYCODAN) 5-1.5 MG/5ML syrup Take 5 mLs by mouth every 8 (eight) hours as needed for cough. 12/26/12  Yes Bruce Romilda Garret, MD  insulin NPH-regular (NOVOLIN 70/30) (70-30) 100 UNIT/ML injection Inject 23 Units into the skin 2 (two) times daily with a meal. Take 23 units in the morning and 23 units at bedtime   Yes Historical Provider, MD  Lancets (FREESTYLE) lancets USE AS DIRECTED. 10/29/11  Yes Bruce Romilda Garret, MD  LIDODERM 5 % Place 1 patch onto the skin daily as needed (pain).  09/12/11  Yes Historical Provider, MD  metFORMIN (GLUCOPHAGE) 1000 MG tablet Take 1,000 mg by mouth 2 (two) times daily with a  meal.   Yes Historical Provider, MD  metronidazole (NORITATE) 1 % cream Apply 1 application topically daily. 11/12/11  Yes Lindley Magnus, MD  omeprazole (PRILOSEC) 20 MG capsule Take 20 mg by mouth daily as needed (heartburn).    Yes Historical Provider, MD  potassium chloride SA (K-DUR,KLOR-CON) 20 MEQ tablet Take 20 mEq by mouth 2 (two) times daily.   Yes Historical Provider, MD  telmisartan (MICARDIS) 80 MG tablet Take 80 mg by mouth at bedtime.   Yes Historical Provider, MD  hydrocortisone (ANUSOL-HC) 2.5 % rectal cream Place 1 application rectally as needed for hemorrhoids. 11/12/11   Lindley Magnus, MD  methocarbamol (ROBAXIN) 500 MG tablet Take 1 tablet (500 mg total) by mouth every 6 (six) hours as needed for muscle spasms. 01/21/13   Alexzandrew Julien Girt, PA-C  Multiple Vitamin (MULITIVITAMIN WITH MINERALS) TABS Take 1 tablet by mouth daily.    Historical Provider, MD  Multiple Vitamins-Minerals (ZINC PO) Take 1 tablet by mouth daily.     Historical Provider,  MD  oxyCODONE (OXY IR/ROXICODONE) 5 MG immediate release tablet Take 1-2 tablets (5-10 mg total) by mouth every 3 (three) hours as needed for breakthrough pain. 01/21/13   Alexzandrew Julien Girt, PA-C  rivaroxaban (XARELTO) 10 MG TABS tablet Take 1 tablet (10 mg total) by mouth daily with breakfast. Take Xarelto for two and a half more weeks, then discontinue Xarelto. Once the patient has completed the blood thinner regimen, then take a Baby 81 mg Aspirin daily for four more weeks. 01/21/13   Alexzandrew Perkins, PA-C  traMADol (ULTRAM) 50 MG tablet Take 1-2 tablets (50-100 mg total) by mouth every 6 (six) hours as needed (mild pain). 01/21/13   Alexzandrew Julien Girt, PA-C  valACYclovir (VALTREX) 500 MG tablet Take 1 tablet by mouth as needed (flair).  04/15/11   Historical Provider, MD   Discharge home with home health  Diet - Cardiac diet, Diabetic diet and Renal diet  Follow up - in 2 weeks  Activity - WBAT  Disposition - Home    Condition Upon Discharge - Good  D/C Meds - See DC Summary  DVT Prophylaxis - Xarelto       Discharge Orders   Future Orders Complete By Expires   Call MD / Call 911  As directed    Comments:     If you experience chest pain or shortness of breath, CALL 911 and be transported to the hospital emergency room.  If you develope a fever above 101 F, pus (white drainage) or increased drainage or redness at the wound, or calf pain, call your surgeon's office.   Change dressing  As directed    Comments:     Change dressing daily with sterile 4 x 4 inch gauze dressing and apply TED hose. Do not submerge the incision under water.   Constipation Prevention  As directed    Comments:     Drink plenty of fluids.  Prune juice may be helpful.  You may use a stool softener, such as Colace (over the counter) 100 mg twice a day.  Use MiraLax (over the counter) for constipation as needed.   Diet - low sodium heart healthy  As directed    Discharge instructions  As directed    Comments:     Pick up stool softner and laxative for home. Do not submerge incision under water. May shower. Continue to use ice for pain and swelling from surgery.  Take Xarelto for two and a half more weeks, then discontinue Xarelto. Once the patient has completed the blood thinner regimen, then take a Baby 81 mg Aspirin daily for four more weeks.   Do not put a pillow under the knee. Place it under the heel.  As directed    Do not sit on low chairs, stoools or toilet seats, as it may be difficult to get up from low surfaces  As directed    Driving restrictions  As directed    Comments:     No driving until released by the physician.   Increase activity slowly as tolerated  As directed    Lifting restrictions  As directed    Comments:     No lifting until released by the physician.   Patient may shower  As directed    Comments:     You may shower without a dressing once there is no drainage.  Do not wash over the wound.   If drainage remains, do not shower until drainage stops.   TED hose  As directed  Comments:     Use stockings (TED hose) for 3 weeks on both leg(s).  You may remove them at night for sleeping.   Weight bearing as tolerated  As directed    Questions:     Laterality:     Extremity:         Medication List    STOP taking these medications       Biotin 1000 MCG tablet     FIBERCON PO     HYDROcodone-acetaminophen 5-325 MG per tablet  Commonly known as:  NORCO/VICODIN     HYDROcodone-homatropine 5-1.5 MG/5ML syrup  Commonly known as:  HYCODAN     LIDODERM 5 %  Generic drug:  lidocaine     multivitamin with minerals Tabs tablet     Vitamin D-3 1000 UNITS Caps     ZINC PO      TAKE these medications       BD INSULIN SYRINGE ULTRAFINE 31G X 5/16" 0.3 ML Misc  Generic drug:  Insulin Syringe-Needle U-100  USE AS DIRECTED TWICE DAILY.     docusate sodium 50 MG capsule  Commonly known as:  COLACE  Take 100 mg by mouth daily.     freestyle lancets  USE AS DIRECTED.     FREESTYLE TEST STRIPS test strip  Generic drug:  glucose blood  as directed.     furosemide 40 MG tablet  Commonly known as:  LASIX  Take 40 mg by mouth 2 (two) times daily.     hydrocortisone 2.5 % rectal cream  Commonly known as:  ANUSOL-HC  Place 1 application rectally as needed for hemorrhoids.     insulin NPH-regular Human (70-30) 100 UNIT/ML injection  Commonly known as:  NOVOLIN 70/30  Inject 23 Units into the skin 2 (two) times daily with a meal. Take 23 units in the morning and 23 units at bedtime     metFORMIN 1000 MG tablet  Commonly known as:  GLUCOPHAGE  Take 1,000 mg by mouth 2 (two) times daily with a meal.     methocarbamol 500 MG tablet  Commonly known as:  ROBAXIN  Take 1 tablet (500 mg total) by mouth every 6 (six) hours as needed for muscle spasms.     metronidazole 1 % cream  Commonly known as:  NORITATE  Apply 1 application topically daily.     omeprazole 20 MG  capsule  Commonly known as:  PRILOSEC  Take 20 mg by mouth daily as needed (heartburn).     oxyCODONE 5 MG immediate release tablet  Commonly known as:  Oxy IR/ROXICODONE  Take 1-2 tablets (5-10 mg total) by mouth every 3 (three) hours as needed for breakthrough pain.     rivaroxaban 10 MG Tabs tablet  Commonly known as:  XARELTO  - Take 1 tablet (10 mg total) by mouth daily with breakfast. Take Xarelto for two and a half more weeks, then discontinue Xarelto.  - Once the patient has completed the blood thinner regimen, then take a Baby 81 mg Aspirin daily for four more weeks.     telmisartan 80 MG tablet  Commonly known as:  MICARDIS  Take 80 mg by mouth at bedtime.     traMADol 50 MG tablet  Commonly known as:  ULTRAM  Take 1-2 tablets (50-100 mg total) by mouth every 6 (six) hours as needed (mild pain).     valACYclovir 500 MG tablet  Commonly known as:  VALTREX  Take 1 tablet by mouth as needed (  flair).       Follow-up Information   Follow up with Loanne Drilling, MD. Schedule an appointment as soon as possible for a visit in 2 weeks.   Specialty:  Orthopedic Surgery   Contact information:   [address] [place] [address] [phone]       Signed: Patrica Duel 02/10/2013, 8:46 AM

## 2013-01-25 ENCOUNTER — Other Ambulatory Visit: Payer: Self-pay | Admitting: Internal Medicine

## 2013-02-07 ENCOUNTER — Other Ambulatory Visit: Payer: Self-pay | Admitting: Internal Medicine

## 2013-03-17 ENCOUNTER — Other Ambulatory Visit: Payer: Self-pay | Admitting: Internal Medicine

## 2013-03-27 ENCOUNTER — Telehealth: Payer: Self-pay | Admitting: Internal Medicine

## 2013-03-27 ENCOUNTER — Other Ambulatory Visit: Payer: Self-pay | Admitting: *Deleted

## 2013-03-27 MED ORDER — PROMETHAZINE HCL 25 MG PO TABS: 25.0000 mg | ORAL_TABLET | Freq: Three times a day (TID) | ORAL | Status: DC | PRN

## 2013-03-27 NOTE — Telephone Encounter (Signed)
Patient Information:  Caller Name: Ludie  Phone: [phone]  Patient: [name]  Gender: Female  DOB: 1945/05/08  Age: 68 Years  PCP: Phoebe Sharps (Adults only)  Office Follow Up:  Does the office need to follow up with this patient?: Yes  Instructions For The Office: Patient is unable to come to office and is requesting Rx for phenergan.  RN Note:  Patient calling with c/o nausea, vomiting, & diarrhea.  No vomiting in past 6 hours after taking "Old Rx of Phenergan".   Patient is requesting a new Rx of Phenergan to be called in to pharmacy ([facility]  [phone])  [facility].  She has withheld  Her Lasix & Potassium today due to diarrhea.  Desires a call back regarding plan of care.  Symptoms  Reason For Call & Symptoms: vomiting & diarrhea  Reviewed Health History In EMR: Yes  Reviewed Medications In EMR: Yes  Reviewed Allergies In EMR: Yes  Reviewed Surgeries / Procedures: Yes  Date of Onset of Symptoms: 03/26/2013  Treatments Tried: phenergan  Treatments Tried Worked: Yes  Guideline(s) Used:  Vomiting  Diarrhea  Disposition Per Guideline:   Go to Office Now  Reason For Disposition Reached:   Age < 60 years and has had > 15 diarrhea stools in past 24 hours  Advice Given:  Call Back If:  You become worse.  Patient Will Follow Care Advice:  NO Requesting RX for Phenergan

## 2013-03-27 NOTE — Telephone Encounter (Signed)
Ok to call in phenergn 25 mg #20 per dr Arnoldo Morale

## 2013-03-30 ENCOUNTER — Other Ambulatory Visit: Payer: Self-pay | Admitting: Internal Medicine

## 2013-03-30 ENCOUNTER — Telehealth: Payer: Self-pay | Admitting: Internal Medicine

## 2013-03-30 NOTE — Telephone Encounter (Signed)
Patient Information:  Caller Name: [name]  Phone: [phone]  Patient: [name], [name]  Gender: Female  DOB: [birth year]  Age: 68 Years  PCP: Phoebe Sharps (Adults only)  Office Follow Up:  Does the office need to follow up with this patient?: Yes  Instructions For The Office: Please have MD review and advise since patient can't come in today;  She needs test strips, states request was sent from her pharmacy and they will deliver them to her;  She needs to know if MD feels she should continue to hold the Lasix 40 mg and K or resume it;  She will start some Lomotil today.  RN Note:  Advised caller to continue monitoring blood sugar closely;  Continue fluids and hydration;  Home care advice given.  Patient advised to come to office now but states she has no ride, husband is at work until 5 pm, and she is having to go to the bathroom so often she is afraid to ask anyone to take her.  Advised triage will be sent for MD to review and advise.  Symptoms  Reason For Call & Symptoms: Had vomiting and diarrhea since 03/26/13; Has spoken with office and has Phenergan which has stopped the vomiting;  She is holding fluids down, still nauseated; Blood sugars ranging from 40-300 she states;  Last Phenergan she had to take was 03/28/13 pm;  Thought she was getting better Sunday and advanced her diet to some oatmeal then was up thru the night with diarrhea;  Held her insulin and Metformin this am for bs that was < 100;  Patient not able to come in because she can't drive, recent TKR in December and husband is working and she is having stools so often;  Urinating but it is dark and cloudy, she is also concerned she could be getting a bladder infection because she isn't going as much because she is holding her Lasix 40 mg and K due to concern for dehydration.  No burning or bladder pain;  Afebrile; Abdomen is bloated with intermittent cramping.  Reviewed Health History In EMR: Yes  Reviewed Medications In EMR:  Yes  Reviewed Allergies In EMR: Yes  Reviewed Surgeries / Procedures: Yes  Date of Onset of Symptoms: 03/26/2013  Treatments Tried: Phenergan, fluids  Treatments Tried Worked: No  Guideline(s) Used:  Diarrhea  Disposition Per Guideline:   Go to Office Now  Reason For Disposition Reached:   Age > 60 years and has had > 6 diarrhea stools in past 24 hours  Advice Given:  N/A  Patient Refused Recommendation:  Patient Will Follow Up With Office Later  Patient has no ride into office today but would like appt. soon she says;  She needs test strips, she only has 2 left, states her pharmacy has sent over request;  She needs to know if MD thinks she should take her Lasix 40 mg and K today?  She will start Lomotil now.  She is holding fluids down.

## 2013-04-01 NOTE — Telephone Encounter (Signed)
Ok for test strips Have her resume lasix and potassium as long as she is able to maintain hydration

## 2013-04-02 NOTE — Telephone Encounter (Signed)
Refill sent in electronically, pt aware of Dr Leanne Chang advice

## 2013-05-12 ENCOUNTER — Other Ambulatory Visit: Payer: Self-pay | Admitting: Internal Medicine

## 2013-05-22 ENCOUNTER — Ambulatory Visit: Payer: BC Managed Care – PPO | Admitting: Internal Medicine

## 2013-05-25 ENCOUNTER — Ambulatory Visit (INDEPENDENT_AMBULATORY_CARE_PROVIDER_SITE_OTHER): Payer: BC Managed Care – PPO | Admitting: Internal Medicine

## 2013-05-25 ENCOUNTER — Encounter: Payer: Self-pay | Admitting: Internal Medicine

## 2013-05-25 VITALS — BP 144/80 | HR 80 | Temp 97.9°F | Ht 64.0 in | Wt 268.0 lb

## 2013-05-25 DIAGNOSIS — R609 Edema, unspecified: Secondary | ICD-10-CM

## 2013-05-25 DIAGNOSIS — E119 Type 2 diabetes mellitus without complications: Secondary | ICD-10-CM

## 2013-05-25 DIAGNOSIS — N19 Unspecified kidney failure: Secondary | ICD-10-CM

## 2013-05-25 DIAGNOSIS — E669 Obesity, unspecified: Secondary | ICD-10-CM

## 2013-05-25 LAB — BASIC METABOLIC PANEL
BUN: 15 mg/dL (ref 6–23)
CHLORIDE: 101 meq/L (ref 96–112)
CO2: 25 meq/L (ref 19–32)
Calcium: 9.5 mg/dL (ref 8.4–10.5)
Creatinine, Ser: 0.9 mg/dL (ref 0.4–1.2)
GFR: 64.81 mL/min (ref >60.00)
Glucose, Bld: 169 mg/dL — ABNORMAL HIGH (ref 70–99)
POTASSIUM: 4 meq/L (ref 3.5–5.1)
SODIUM: 137 meq/L (ref 135–145)

## 2013-05-25 LAB — LIPID PANEL
CHOL/HDL RATIO: 2
Cholesterol: 78 mg/dL (ref 0–200)
HDL: 34.6 mg/dL — ABNORMAL LOW (ref >39.00)
LDL CALC: 31 mg/dL (ref 0–99)
TRIGLYCERIDES: 64 mg/dL (ref 0.0–149.0)
VLDL: 12.8 mg/dL (ref 0.0–40.0)

## 2013-05-25 LAB — HEPATIC FUNCTION PANEL
ALT: 26 U/L (ref 0–35)
AST: 29 U/L (ref 0–37)
Albumin: 3.6 g/dL (ref 3.5–5.2)
Alkaline Phosphatase: 76 U/L (ref 39–117)
BILIRUBIN DIRECT: 0 mg/dL (ref 0.0–0.3)
BILIRUBIN TOTAL: 0.3 mg/dL (ref 0.3–1.2)
Total Protein: 7.4 g/dL (ref 6.0–8.3)

## 2013-05-25 LAB — HEMOGLOBIN A1C: HEMOGLOBIN A1C: 7.1 % — AB (ref 4.6–6.5)

## 2013-05-25 NOTE — Progress Notes (Signed)
Dm- no frequent cbgs. 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 She has used oxycodone and hydrocodone for neuropathy. These are left over medications from knee surgery and from shingles.   htn- tolerating meds  S/p TKA- says she is doing fairly well.   Lipids- not on statin  She has a ventral hernia for years.   Obesity- she is not following a diet or exercise plan.   Edema- she has chronic edema of feet and ankles. This may be some worse after TKA  reveiwed pmh, meds, sochx, surgeries  Ros- chronic fatigue, LE edema, chronic SOB, Still with some knee pain but much better.  obese female in no acute distress. HEENT exam atraumatic, normocephalic, extraocular muscles are intact. Neck is supple. No jugular venous distention no thyromegaly. Chest clear to auscultation without increased work of breathing. Cardiac exam S1 and S2 are regular. Abdominal exam active bowel sounds, soft, nontender. Extremities with 2 + edema at feet and ankles. Neurologic exam she is alert    DIABETES MELLITUS, TYPE II She has likely developed a neuropathy She desperately needs to lose weight Advised daily exercise Advised low calorie diet (1200 Kcal)  RENAL FAILURE Lab Results  Component Value Date   CREATININE 0.94 01/21/2013   Will check today-  Her renal failure was related to surgery. She tolerated this last surgery well.   EDEMA Multifactorial. Main issue is weight. She desperately needs to lose weight  OBESITY This is the cause of essentially all of her other problems. Discussed need for aggressive weight loss.  Low calorie diet and daily exercise would do wonders for her

## 2013-05-25 NOTE — Assessment & Plan Note (Signed)
She has likely developed a neuropathy She desperately needs to lose weight Advised daily exercise Advised low calorie diet (1200 Kcal)

## 2013-05-25 NOTE — Assessment & Plan Note (Signed)
Multifactorial. Main issue is weight. She desperately needs to lose weight

## 2013-05-25 NOTE — Assessment & Plan Note (Signed)
Lab Results  Component Value Date   CREATININE 0.94 01/21/2013   Will check today-  Her renal failure was related to surgery. She tolerated this last surgery well.

## 2013-05-25 NOTE — Progress Notes (Signed)
Pre visit review using our clinic review tool, if applicable. No additional management support is needed unless otherwise documented below in the visit note. 

## 2013-05-26 LAB — MICROALBUMIN / CREATININE URINE RATIO
Creatinine,U: 89.6 mg/dL
Microalb Creat Ratio: 0.3 mg/g (ref 0.0–30.0)
Microalb, Ur: 0.3 mg/dL (ref 0.0–1.9)

## 2013-05-26 NOTE — Assessment & Plan Note (Signed)
This is the cause of essentially all of her other problems. Discussed need for aggressive weight loss.  Low calorie diet and daily exercise would do wonders for her

## 2013-05-29 ENCOUNTER — Telehealth: Payer: Self-pay

## 2013-05-29 ENCOUNTER — Telehealth: Payer: Self-pay | Admitting: Internal Medicine

## 2013-05-29 NOTE — Telephone Encounter (Signed)
Relevant patient education mailed to patient.  

## 2013-05-29 NOTE — Telephone Encounter (Signed)
See result note.  

## 2013-05-29 NOTE — Telephone Encounter (Addendum)
Pt states Dr Leanne Chang was to have rx her a new med that was better for her neuropathy after labs. Pt has not heard anything. pls advise on med. Does pt coninue to take at [facility] only? [facility]

## 2013-06-04 ENCOUNTER — Telehealth: Payer: Self-pay | Admitting: Internal Medicine

## 2013-06-04 ENCOUNTER — Encounter (HOSPITAL_COMMUNITY): Payer: Self-pay | Admitting: Emergency Medicine

## 2013-06-04 ENCOUNTER — Emergency Department (HOSPITAL_COMMUNITY): Payer: BC Managed Care – PPO

## 2013-06-04 ENCOUNTER — Emergency Department (HOSPITAL_COMMUNITY)
Admission: EM | Admit: 2013-06-04 | Discharge: 2013-06-04 | Disposition: A | Discharge status: Home / Self Care | Payer: BC Managed Care – PPO | Attending: Emergency Medicine | Admitting: Emergency Medicine

## 2013-06-04 DIAGNOSIS — Z96659 Presence of unspecified artificial knee joint: Secondary | ICD-10-CM | POA: Insufficient documentation

## 2013-06-04 DIAGNOSIS — S99929A Unspecified injury of unspecified foot, initial encounter: Principal | ICD-10-CM

## 2013-06-04 DIAGNOSIS — Z79899 Other long term (current) drug therapy: Secondary | ICD-10-CM | POA: Diagnosis not present

## 2013-06-04 DIAGNOSIS — Z8674 Personal history of sudden cardiac arrest: Secondary | ICD-10-CM | POA: Insufficient documentation

## 2013-06-04 DIAGNOSIS — Y92410 Unspecified street and highway as the place of occurrence of the external cause: Secondary | ICD-10-CM | POA: Insufficient documentation

## 2013-06-04 DIAGNOSIS — S99919A Unspecified injury of unspecified ankle, initial encounter: Secondary | ICD-10-CM | POA: Diagnosis present

## 2013-06-04 DIAGNOSIS — F329 Major depressive disorder, single episode, unspecified: Secondary | ICD-10-CM | POA: Insufficient documentation

## 2013-06-04 DIAGNOSIS — Z8619 Personal history of other infectious and parasitic diseases: Secondary | ICD-10-CM | POA: Insufficient documentation

## 2013-06-04 DIAGNOSIS — Z8673 Personal history of transient ischemic attack (TIA), and cerebral infarction without residual deficits: Secondary | ICD-10-CM | POA: Diagnosis not present

## 2013-06-04 DIAGNOSIS — Z8701 Personal history of pneumonia (recurrent): Secondary | ICD-10-CM | POA: Insufficient documentation

## 2013-06-04 DIAGNOSIS — Z8669 Personal history of other diseases of the nervous system and sense organs: Secondary | ICD-10-CM | POA: Insufficient documentation

## 2013-06-04 DIAGNOSIS — F3289 Other specified depressive episodes: Secondary | ICD-10-CM | POA: Insufficient documentation

## 2013-06-04 DIAGNOSIS — Z792 Long term (current) use of antibiotics: Secondary | ICD-10-CM | POA: Insufficient documentation

## 2013-06-04 DIAGNOSIS — Z794 Long term (current) use of insulin: Secondary | ICD-10-CM | POA: Insufficient documentation

## 2013-06-04 DIAGNOSIS — M1990 Unspecified osteoarthritis, unspecified site: Secondary | ICD-10-CM | POA: Diagnosis not present

## 2013-06-04 DIAGNOSIS — IMO0002 Reserved for concepts with insufficient information to code with codable children: Secondary | ICD-10-CM | POA: Insufficient documentation

## 2013-06-04 DIAGNOSIS — I10 Essential (primary) hypertension: Secondary | ICD-10-CM | POA: Insufficient documentation

## 2013-06-04 DIAGNOSIS — S8990XA Unspecified injury of unspecified lower leg, initial encounter: Secondary | ICD-10-CM | POA: Diagnosis present

## 2013-06-04 DIAGNOSIS — Z8709 Personal history of other diseases of the respiratory system: Secondary | ICD-10-CM | POA: Diagnosis not present

## 2013-06-04 DIAGNOSIS — Z87448 Personal history of other diseases of urinary system: Secondary | ICD-10-CM | POA: Insufficient documentation

## 2013-06-04 DIAGNOSIS — K219 Gastro-esophageal reflux disease without esophagitis: Secondary | ICD-10-CM | POA: Diagnosis not present

## 2013-06-04 DIAGNOSIS — Z7901 Long term (current) use of anticoagulants: Secondary | ICD-10-CM | POA: Insufficient documentation

## 2013-06-04 DIAGNOSIS — E119 Type 2 diabetes mellitus without complications: Secondary | ICD-10-CM | POA: Insufficient documentation

## 2013-06-04 DIAGNOSIS — M25561 Pain in right knee: Secondary | ICD-10-CM

## 2013-06-04 DIAGNOSIS — Y9389 Activity, other specified: Secondary | ICD-10-CM | POA: Diagnosis not present

## 2013-06-04 MED ORDER — HYDROCODONE-ACETAMINOPHEN 5-325 MG PO TABS
2.0000 | ORAL_TABLET | Freq: Once | ORAL | Status: AC
Start: 2013-06-04 — End: 2013-06-04
  Administered 2013-06-04: 2 via ORAL
  Filled 2013-06-04: qty 2

## 2013-06-04 MED ORDER — HYDROCODONE-ACETAMINOPHEN 5-325 MG PO TABS: 1.0000 | ORAL_TABLET | Freq: Four times a day (QID) | ORAL | Status: DC | PRN

## 2013-06-04 MED ORDER — HYDROCODONE-ACETAMINOPHEN 5-325 MG PO TABS: 1.0000 | ORAL_TABLET | Freq: Every evening | ORAL | Status: DC | PRN

## 2013-06-04 MED ORDER — ONDANSETRON 8 MG PO TBDP
8.0000 mg | ORAL_TABLET | Freq: Once | ORAL | Status: AC
  Administered 2013-06-04: 8 mg via ORAL
  Filled 2013-06-04: qty 1

## 2013-06-04 MED ORDER — HYDROCODONE-ACETAMINOPHEN 5-325 MG PO TABS
2.0000 | ORAL_TABLET | Freq: Once | ORAL | Status: AC
  Filled 2013-06-04: qty 2

## 2013-06-04 MED ORDER — ONDANSETRON HCL 4 MG PO TABS: 4.0000 mg | ORAL_TABLET | Freq: Four times a day (QID) | ORAL | Status: DC
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
## 2013-06-04 NOTE — ED Notes (Signed)
Patient transported to X-ray 
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
## 2013-06-04 NOTE — Telephone Encounter (Signed)
Pt needs new rx hydrocodone °

## 2013-06-04 NOTE — ED Notes (Signed)
Ice pack put on R knee

## 2013-06-04 NOTE — Telephone Encounter (Signed)
Ok per Dr Swords, rx up front for p/u, pt aware 

## 2013-06-09 NOTE — ED Provider Notes (Signed)
Medical screening examination/treatment/procedure(s) were performed by non-physician practitioner and as supervising physician I was immediately available for consultation/collaboration.   EKG Interpretation None       Jasper Riling. Alvino Chapel, MD 06/09/13 [phone]

## 2013-06-16 ENCOUNTER — Other Ambulatory Visit: Payer: Self-pay | Admitting: Internal Medicine

## 2013-07-03 ENCOUNTER — Telehealth: Payer: Self-pay | Admitting: Internal Medicine

## 2013-07-03 NOTE — Telephone Encounter (Signed)
Pt request refill of HYDROcodone-acetaminophen (NORCO/VICODIN) 5-325 MG per tablet Pt asked if you would fill for 60 tabs which would be a 2 mo supply and pt would not have to come in as often.

## 2013-07-06 NOTE — Telephone Encounter (Signed)
Let's have her stay off hydrocodone

## 2013-07-08 NOTE — Telephone Encounter (Signed)
Pt wants to know if you can give her lyrica instead.  Please advise

## 2013-07-08 NOTE — Telephone Encounter (Signed)
Pt has bilateral neuropathy

## 2013-07-16 ENCOUNTER — Encounter: Payer: Self-pay | Admitting: Physician Assistant

## 2013-07-16 ENCOUNTER — Ambulatory Visit (INDEPENDENT_AMBULATORY_CARE_PROVIDER_SITE_OTHER): Payer: BC Managed Care – PPO | Admitting: Physician Assistant

## 2013-07-16 VITALS — BP 132/70 | HR 96 | Temp 98.2°F | Resp 18

## 2013-07-16 DIAGNOSIS — B0229 Other postherpetic nervous system involvement: Secondary | ICD-10-CM

## 2013-07-16 DIAGNOSIS — E1142 Type 2 diabetes mellitus with diabetic polyneuropathy: Secondary | ICD-10-CM

## 2013-07-16 DIAGNOSIS — E1149 Type 2 diabetes mellitus with other diabetic neurological complication: Secondary | ICD-10-CM

## 2013-07-16 MED ORDER — GABAPENTIN 100 MG PO CAPS: 100.0000 mg | ORAL_CAPSULE | Freq: Three times a day (TID) | ORAL | Status: DC

## 2013-07-16 NOTE — Telephone Encounter (Signed)
Pt states she also has post shingles pain and her neuropathy. Advised pt to fu w/ matt.

## 2013-07-16 NOTE — Patient Instructions (Signed)
Gabapentin, start with 1 tab to see how you handle it, then take 2 tabs the next day, then 3 the third day and everyday after.  If you develop adverse effects, stop the medications and call the office.  If emergency symptoms discussed during visit developed, seek medical attention immediately.  Followup as needed, or for worsening or persistent symptoms despite treatment.   Gabapentin capsules or tablets What is this medicine? GABAPENTIN (GA ba pen tin) is used to control partial seizures in adults with epilepsy. It is also used to treat certain types of nerve pain. This medicine may be used for other purposes; ask your health care provider or pharmacist if you have questions. COMMON BRAND NAME(S): Orpha Bur, Neurontin What should I tell my health care provider before I take this medicine? They need to know if you have any of these conditions: -kidney disease -suicidal thoughts, plans, or attempt; a previous suicide attempt by you or a family member -an unusual or allergic reaction to gabapentin, other medicines, foods, dyes, or preservatives -pregnant or trying to get pregnant -breast-feeding How should I use this medicine? Take this medicine by mouth with a glass of water. Follow the directions on the prescription label. You can take it with or without food. If it upsets your stomach, take it with food.Take your medicine at regular intervals. Do not take it more often than directed. Do not stop taking except on your doctor's advice. If you are directed to break the 600 or 800 mg tablets in half as part of your dose, the extra half tablet should be used for the next dose. If you have not used the extra half tablet within 28 days, it should be thrown away. A special MedGuide will be given to you by the pharmacist with each prescription and refill. Be sure to read this information carefully each time. Talk to your pediatrician regarding the use of this medicine in children. Special care may be  needed. Overdosage: If you think you have taken too much of this medicine contact a poison control center or emergency room at once. NOTE: This medicine is only for you. Do not share this medicine with others. What if I miss a dose? If you miss a dose, take it as soon as you can. If it is almost time for your next dose, take only that dose. Do not take double or extra doses. What may interact with this medicine? Do not take this medicine with any of the following medications: -other gabapentin products This medicine may also interact with the following medications: -alcohol -antacids -antihistamines for allergy, cough and cold -certain medicines for anxiety or sleep -certain medicines for depression or psychotic disturbances -homatropine; hydrocodone -naproxen -narcotic medicines (opiates) for pain -phenothiazines like chlorpromazine, mesoridazine, prochlorperazine, thioridazine This list may not describe all possible interactions. Give your health care provider a list of all the medicines, herbs, non-prescription drugs, or dietary supplements you use. Also tell them if you smoke, drink alcohol, or use illegal drugs. Some items may interact with your medicine. What should I watch for while using this medicine? Visit your doctor or health care professional for regular checks on your progress. You may want to keep a record at home of how you feel your condition is responding to treatment. You may want to share this information with your doctor or health care professional at each visit. You should contact your doctor or health care professional if your seizures get worse or if you have any new types of  seizures. Do not stop taking this medicine or any of your seizure medicines unless instructed by your doctor or health care professional. Stopping your medicine suddenly can increase your seizures or their severity. Wear a medical identification bracelet or chain if you are taking this medicine for  seizures, and carry a card that lists all your medications. You may get drowsy, dizzy, or have blurred vision. Do not drive, use machinery, or do anything that needs mental alertness until you know how this medicine affects you. To reduce dizzy or fainting spells, do not sit or stand up quickly, especially if you are an older patient. Alcohol can increase drowsiness and dizziness. Avoid alcoholic drinks. Your mouth may get dry. Chewing sugarless gum or sucking hard candy, and drinking plenty of water will help. The use of this medicine may increase the chance of suicidal thoughts or actions. Pay special attention to how you are responding while on this medicine. Any worsening of mood, or thoughts of suicide or dying should be reported to your health care professional right away. Women who become pregnant while using this medicine may enroll in the [place] Pregnancy Registry by calling [phone]. This registry collects information about the safety of antiepileptic drug use during pregnancy. What side effects may I notice from receiving this medicine? Side effects that you should report to your doctor or health care professional as soon as possible: -allergic reactions like skin rash, itching or hives, swelling of the face, lips, or tongue -worsening of mood, thoughts or actions of suicide or dying Side effects that usually do not require medical attention (report to your doctor or health care professional if they continue or are bothersome): -constipation -difficulty walking or controlling muscle movements -dizziness -nausea -slurred speech -tiredness -tremors -weight gain This list may not describe all possible side effects. Call your doctor for medical advice about side effects. You may report side effects to FDA at 1-800-FDA-1088. Where should I keep my medicine? Keep out of reach of children. Store at room temperature between 15 and 30 degrees C (59 and 86  degrees F). Throw away any unused medicine after the expiration date. NOTE: This sheet is a summary. It may not cover all possible information. If you have questions about this medicine, talk to your doctor, pharmacist, or health care provider.  2015, Elsevier/Gold Standard. (2012-09-18 09:12:48)

## 2013-07-16 NOTE — Progress Notes (Signed)
Subjective:    Patient ID: [name], female    DOB: [birth year], 68 y.o.   MRN: [id number]  HPI Patient is a 68 y.o. female presenting for follow up on postherpetic neuralgia and  Diabetic neuropathy. Had knee replacement surgery 01/19/2013. Did well. Has had post herpetic neuralgia badly in right breast region. Has had diabetic neuropathy in legs, the numbness has been worsening over the last year and a half. Had not been experiencing any pain normally, but had started to develop painful neuralgia in her right foot over the past 4 months. She describes electric quality, pins and needles pain, that can be as bad as 10/10 pain. She has been taking Vicodin occasionally for this, however does not wish to become addicted. Has asked PCP about Lyrica for treatment, however he first wanted to get her kidney function labs. Her last GFR was 64.81 on 05/25/2013. She has never tried gabapentin. She denies fevers, chills, nausea, vomiting, diarrhea, shortness of breath, chest pain, headache.  Review of Systems As per history of present illness and otherwise negative.    Past Medical History  Diagnosis Date  . Diabetes mellitus   . Hypertension   . Arthritis   . Lower extremity edema   . Osteoarthritis   . Lumbar pain   . Complication of joint prosthesis     locks up and no PT after knee replacement  . Peripheral neuropathy     below ankle in both feet  . Renal failure     "after left knee replacement, fine now" some renal damage  . CVA (cerebral vascular accident) 2008    "small hemmoragic" right sided face drooping, no residual or follow up, can not confirmed because of MRI, questionable  . H/O bronchitis   . Pneumonia     h/o, vaccine given  . GERD (gastroesophageal reflux disease)     mild, occasional  . H/O hemorrhoids   . H/O measles   . H/O mumps   . H/O rubella   . Depression     very long time ago  . Umbilical hernia     "huge"  . Complication of anesthesia     after  surgery: renal failure, hypotension, tachycardia, shock  . H/O cardiac arrest     from contrast dye    History   Social History  . Marital Status: Married    Spouse Name: N/A    Number of Children: N/A  . Years of Education: N/A   Occupational History  . Not on file.   Social History Main Topics  . Smoking status: Never Smoker   . Smokeless tobacco: Never Used  . Alcohol Use: No  . Drug Use: No  . Sexual Activity: Not on file   Other Topics Concern  . Not on file   Social History Narrative  . No narrative on file    Past Surgical History  Procedure Laterality Date  . Knee arthroscopy      bilateral  . Gastric bypass  before 2006    at [facility]  . Incontinence surgery  May 2006    SPARK, cystocelle, rectocelle  . Replacement total knee Left Oct 2006  . Tonsillectomy  age 36  . Abdominal hysterectomy  1993    abdominal, left ovaries  . Total knee arthroplasty Right 01/19/2013    Procedure: RIGHT TOTAL KNEE ARTHROPLASTY;  Surgeon: Gearlean Alf, MD;  Location: [facility];  Service: Orthopedics;  Laterality: Right;    No family history  on file.  Allergies  Allergen Reactions  . Ibuprofen Swelling    Shortness of breath  . Iohexol Other (See Comments)    "coded" No contrast dye  . Nsaids Swelling    H/o of kidney failure    Current Outpatient Prescriptions on File Prior to Visit  Medication Sig Dispense Refill  . BD INSULIN SYRINGE ULTRAFINE 31G X 5/16" 0.3 ML MISC USE AS DIRECTED TWICE DAILY.  100 each  5  . docusate sodium (COLACE) 50 MG capsule Take 100 mg by mouth daily.       Marland Kitchen FREESTYLE TEST STRIPS test strip CHECK BLOOD SUGAR 4 TIMES A DAY.  100 each  5  . furosemide (LASIX) 40 MG tablet Take 40 mg by mouth 2 (two) times daily.      Marland Kitchen glipiZIDE (GLUCOTROL XL) 10 MG 24 hr tablet Take 10 mg by mouth 2 (two) times daily.      Marland Kitchen HYDROcodone-acetaminophen (NORCO/VICODIN) 5-325 MG per tablet Take 1 tablet by mouth at bedtime as needed for moderate pain.  30  tablet  0  . HYDROcodone-acetaminophen (NORCO/VICODIN) 5-325 MG per tablet Take 1-2 tablets by mouth every 6 (six) hours as needed.  20 tablet  0  . hydrocortisone (ANUSOL-HC) 2.5 % rectal cream Place 1 application rectally 2 (two) times daily as needed for hemorrhoids or itching.      . insulin NPH-regular Human (NOVOLIN 70/30) (70-30) 100 UNIT/ML injection Inject 23 Units into the skin 2 (two) times daily with a meal.      . Lancets (FREESTYLE) lancets USE AS DIRECTED  100 each  5  . metFORMIN (GLUCOPHAGE) 1000 MG tablet Take 1,000 mg by mouth 2 (two) times daily with a meal.      . methocarbamol (ROBAXIN) 500 MG tablet Take 1 tablet (500 mg total) by mouth every 6 (six) hours as needed for muscle spasms.  80 tablet  0  . metronidazole (NORITATE) 1 % cream Apply 1 application topically daily.      Marland Kitchen omeprazole (PRILOSEC) 20 MG capsule Take 20 mg by mouth daily as needed (heartburn).       . ondansetron (ZOFRAN) 4 MG tablet Take 1 tablet (4 mg total) by mouth every 6 (six) hours.  12 tablet  0  . potassium chloride SA (K-DUR,KLOR-CON) 20 MEQ tablet Take 20 mEq by mouth 2 (two) times daily.      Marland Kitchen telmisartan (MICARDIS) 80 MG tablet Take 80 mg by mouth daily.       . valACYclovir (VALTREX) 500 MG tablet Take 1 tablet by mouth as needed (flair).       . [DISCONTINUED] potassium chloride (KLOR-CON) 20 MEQ packet Take 20 mEq by mouth 2 (two) times daily.         No current facility-administered medications on file prior to visit.    EXAM: BP 132/70  Pulse 96  Temp(Src) 98.2 F (36.8 C) (Oral)  Resp 18     Objective:   Physical Exam  Nursing note and vitals reviewed. Constitutional: She is oriented to person, place, and time. She appears well-developed and well-nourished. No distress.  HENT:  Head: Normocephalic and atraumatic.  Right Ear: External ear normal.  Left Ear: External ear normal.  Nose: Nose normal.  Mouth/Throat: No oropharyngeal exudate.  Eyes: Conjunctivae and EOM are  normal. Pupils are equal, round, and reactive to light.  Neck: Normal range of motion. Neck supple.  Cardiovascular: Normal rate, regular rhythm, normal heart sounds and intact distal pulses.  Pulmonary/Chest: Effort normal and breath sounds normal. No respiratory distress. She has no wheezes. She has no rales. She exhibits no tenderness.  Musculoskeletal: Normal range of motion. She exhibits no edema.  Neurological: She is alert and oriented to person, place, and time.  Skin: Skin is warm and dry. No rash noted. She is not diaphoretic. No erythema. No pallor.  Psychiatric: She has a normal mood and affect. Her behavior is normal. Judgment and thought content normal.   Lab Results  Component Value Date   WBC 15.0* 01/21/2013   HGB 10.3* 01/21/2013   HCT 31.5* 01/21/2013   PLT 393 01/21/2013   GLUCOSE 169* 05/25/2013   CHOL 78 05/25/2013   TRIG 64.0 05/25/2013   HDL 34.60* 05/25/2013   LDLCALC 31 05/25/2013   ALT 26 05/25/2013   AST 29 05/25/2013   NA 137 05/25/2013   K 4.0 05/25/2013   CL 101 05/25/2013   CREATININE 0.9 05/25/2013   BUN 15 05/25/2013   CO2 25 05/25/2013   TSH 5.07 04/09/2012   INR 1.04 01/12/2013   HGBA1C 7.1* 05/25/2013   MICROALBUR 0.3 05/25/2013       Assessment & Plan:  [name] was seen today for follow-up.  Diagnoses and associated orders for this visit:  Diabetic polyneuropathy associated with type 2 diabetes mellitus - gabapentin (NEURONTIN) 100 MG capsule; Take 1 capsule (100 mg total) by mouth 3 (three) times daily.  Neuralgia, postherpetic - gabapentin (NEURONTIN) 100 MG capsule; Take 1 capsule (100 mg total) by mouth 3 (three) times daily.   Pt inquiring about Lyrica, however has never tried gabapentin. Will try gabapentin first.  Pt concerned about possibility of weight gain on gabapentin, we will start at suboptimal dose and grow slowly to prevent adverse effects. Pt will call if any adverse effects develop.  Return precautions provided, and patient  handout on gabapentin.  Plan to follow up as needed, or for worsening or persistent symptoms despite treatment.  Patient Instructions  Gabapentin, start with 1 tab to see how you handle it, then take 2 tabs the next day, then 3 the third day and everyday after.  If you develop adverse effects, stop the medications and call the office.  If emergency symptoms discussed during visit developed, seek medical attention immediately.  Followup as needed, or for worsening or persistent symptoms despite treatment.

## 2013-07-27 ENCOUNTER — Telehealth: Payer: Self-pay | Admitting: Internal Medicine

## 2013-07-29 ENCOUNTER — Other Ambulatory Visit: Payer: Self-pay | Admitting: Internal Medicine

## 2013-08-21 ENCOUNTER — Ambulatory Visit (INDEPENDENT_AMBULATORY_CARE_PROVIDER_SITE_OTHER): Payer: BC Managed Care – PPO | Admitting: Physician Assistant

## 2013-08-21 ENCOUNTER — Encounter: Payer: Self-pay | Admitting: Physician Assistant

## 2013-08-21 VITALS — BP 120/76 | HR 84 | Temp 97.9°F | Resp 18

## 2013-08-21 DIAGNOSIS — M545 Low back pain, unspecified: Secondary | ICD-10-CM

## 2013-08-21 DIAGNOSIS — E1142 Type 2 diabetes mellitus with diabetic polyneuropathy: Secondary | ICD-10-CM

## 2013-08-21 DIAGNOSIS — E1149 Type 2 diabetes mellitus with other diabetic neurological complication: Secondary | ICD-10-CM

## 2013-08-21 NOTE — Patient Instructions (Addendum)
Continue your at home management of your acute back pain with muscle relaxant as needed for acute spasms. Try to avoid taking vicodin if possible.  We will try having you titrate back up the gabapentin and stop at 2 per day to see if this is effective at treating your pain, while preventing the adverse effects. Try 1 at bedtime for 1 week, then take 2 per day: 1 with dinner and 1 at bedtime. If side effects develop, stop taking and call office.  If emergency symptoms discussed during visit developed, seek medical attention immediately.  Followup as needed, or for worsening or persistent symptoms despite treatment.    Back Pain, Adult Back pain is very common. The pain often gets better over time. The cause of back pain is usually not dangerous. Most people can learn to manage their back pain on their own.  HOME CARE   Stay active. Start with short walks on flat ground if you can. Try to walk farther each day.  Do not sit, drive, or stand in one place for more than 30 minutes. Do not stay in bed.  Do not avoid exercise or work. Activity can help your back heal faster.  Be careful when you bend or lift an object. Bend at your knees, keep the object close to you, and do not twist.  Sleep on a firm mattress. Lie on your side, and bend your knees. If you lie on your back, put a pillow under your knees.  Only take medicines as told by your doctor.  Put ice on the injured area.  Put ice in a plastic bag.  Place a towel between your skin and the bag.  Leave the ice on for 15-20 minutes, 03-04 times a day for the first 2 to 3 days. After that, you can switch between ice and heat packs.  Ask your doctor about back exercises or massage.  Avoid feeling anxious or stressed. Find good ways to deal with stress, such as exercise. GET HELP RIGHT AWAY IF:   Your pain does not go away with rest or medicine.  Your pain does not go away in 1 week.  You have new problems.  You do not feel  well.  The pain spreads into your legs.  You cannot control when you poop (bowel movement) or pee (urinate).  Your arms or legs feel weak or lose feeling (numbness).  You feel sick to your stomach (nauseous) or throw up (vomit).  You have belly (abdominal) pain.  You feel like you may pass out (faint). MAKE SURE YOU:   Understand these instructions.  Will watch your condition.  Will get help right away if you are not doing well or get worse. Document Released: 07/04/2007 Document Revised: 04/09/2011 Document Reviewed: 05/19/2013 [facility] Patient Information 2015 [place], [place]. This information is not intended to replace advice given to you by your health care provider. Make sure you discuss any questions you have with your health care provider. Gabapentin capsules or tablets What is this medicine? GABAPENTIN (GA ba pen tin) is used to control partial seizures in adults with epilepsy. It is also used to treat certain types of nerve pain. This medicine may be used for other purposes; ask your health care provider or pharmacist if you have questions. COMMON BRAND NAME(S): Orpha Bur, Neurontin What should I tell my health care provider before I take this medicine? They need to know if you have any of these conditions: -kidney disease -suicidal thoughts, plans, or attempt; a  previous suicide attempt by you or a family member -an unusual or allergic reaction to gabapentin, other medicines, foods, dyes, or preservatives -pregnant or trying to get pregnant -breast-feeding How should I use this medicine? Take this medicine by mouth with a glass of water. Follow the directions on the prescription label. You can take it with or without food. If it upsets your stomach, take it with food.Take your medicine at regular intervals. Do not take it more often than directed. Do not stop taking except on your doctor's advice. If you are directed to break the 600 or 800 mg tablets in half as part of  your dose, the extra half tablet should be used for the next dose. If you have not used the extra half tablet within 28 days, it should be thrown away. A special MedGuide will be given to you by the pharmacist with each prescription and refill. Be sure to read this information carefully each time. Talk to your pediatrician regarding the use of this medicine in children. Special care may be needed. Overdosage: If you think you have taken too much of this medicine contact a poison control center or emergency room at once. NOTE: This medicine is only for you. Do not share this medicine with others. What if I miss a dose? If you miss a dose, take it as soon as you can. If it is almost time for your next dose, take only that dose. Do not take double or extra doses. What may interact with this medicine? Do not take this medicine with any of the following medications: -other gabapentin products This medicine may also interact with the following medications: -alcohol -antacids -antihistamines for allergy, cough and cold -certain medicines for anxiety or sleep -certain medicines for depression or psychotic disturbances -homatropine; hydrocodone -naproxen -narcotic medicines (opiates) for pain -phenothiazines like chlorpromazine, mesoridazine, prochlorperazine, thioridazine This list may not describe all possible interactions. Give your health care provider a list of all the medicines, herbs, non-prescription drugs, or dietary supplements you use. Also tell them if you smoke, drink alcohol, or use illegal drugs. Some items may interact with your medicine. What should I watch for while using this medicine? Visit your doctor or health care professional for regular checks on your progress. You may want to keep a record at home of how you feel your condition is responding to treatment. You may want to share this information with your doctor or health care professional at each visit. You should contact your  doctor or health care professional if your seizures get worse or if you have any new types of seizures. Do not stop taking this medicine or any of your seizure medicines unless instructed by your doctor or health care professional. Stopping your medicine suddenly can increase your seizures or their severity. Wear a medical identification bracelet or chain if you are taking this medicine for seizures, and carry a card that lists all your medications. You may get drowsy, dizzy, or have blurred vision. Do not drive, use machinery, or do anything that needs mental alertness until you know how this medicine affects you. To reduce dizzy or fainting spells, do not sit or stand up quickly, especially if you are an older patient. Alcohol can increase drowsiness and dizziness. Avoid alcoholic drinks. Your mouth may get dry. Chewing sugarless gum or sucking hard candy, and drinking plenty of water will help. The use of this medicine may increase the chance of suicidal thoughts or actions. Pay special attention to how you  are responding while on this medicine. Any worsening of mood, or thoughts of suicide or dying should be reported to your health care professional right away. Women who become pregnant while using this medicine may enroll in the [place] Pregnancy Registry by calling [phone]. This registry collects information about the safety of antiepileptic drug use during pregnancy. What side effects may I notice from receiving this medicine? Side effects that you should report to your doctor or health care professional as soon as possible: -allergic reactions like skin rash, itching or hives, swelling of the face, lips, or tongue -worsening of mood, thoughts or actions of suicide or dying Side effects that usually do not require medical attention (report to your doctor or health care professional if they continue or are bothersome): -constipation -difficulty walking or  controlling muscle movements -dizziness -nausea -slurred speech -tiredness -tremors -weight gain This list may not describe all possible side effects. Call your doctor for medical advice about side effects. You may report side effects to FDA at 1-800-FDA-1088. Where should I keep my medicine? Keep out of reach of children. Store at room temperature between 15 and 30 degrees C (59 and 86 degrees F). Throw away any unused medicine after the expiration date. NOTE: This sheet is a summary. It may not cover all possible information. If you have questions about this medicine, talk to your doctor, pharmacist, or health care provider.  2015, Elsevier/Gold Standard. (2012-09-18 09:12:48)

## 2013-08-21 NOTE — Progress Notes (Signed)
Subjective:    Patient ID: [name], female    DOB: 08-Jan-1946, 68 y.o.   MRN: [id number]  Back Pain This is a new problem. The current episode started yesterday (related to bending over). The problem occurs constantly. The problem has been gradually improving since onset. The pain is present in the gluteal. The quality of the pain is described as shooting. The pain radiates to the right thigh, right knee, right foot and left thigh. The pain is at a severity of 6/10. The symptoms are aggravated by twisting and standing (walking). Associated symptoms include leg pain (radiating, better today then yesterday) and numbness (at baseline, related to knee replacements. nothing new.). Pertinent negatives include no abdominal pain, bladder incontinence, bowel incontinence, chest pain, dysuria, fever, headaches, paresis, paresthesias, pelvic pain, perianal numbness, tingling, weakness or weight loss. Risk factors include obesity and sedentary lifestyle. Treatments tried: took vicodin and muscle relaxer last night. The treatment provided significant relief.     Patient is a 68 y.o. female presenting for follow up on neuropathy. Pt was taking gabapentin for neuropathy, this was helping her pain, however, once she started taking 3 pills per day, she states that it completely relieved her pain. Unfortunately she states that she was getting adverse effects from taking gabapentin, which were blurred vision and difficulty with coordination. She states that she stopped taking the gabapentin per pharmacy recommendation. She is wondering if perhaps she could retry the gabapentin, but instead on twice daily instead of 3 times daily, to see if this eliminates her side effects. She denies any other effects. She denies fevers, chills, nausea, vomiting, diarrhea, SOB, chest pain, headache, syncope.   Review of Systems  Constitutional: Negative for fever, chills and weight loss.  Respiratory: Negative for shortness of  breath.   Cardiovascular: Negative for chest pain.  Gastrointestinal: Negative for nausea, vomiting, abdominal pain, diarrhea and bowel incontinence.  Genitourinary: Negative for bladder incontinence, dysuria and pelvic pain.  Musculoskeletal: Positive for back pain.  Neurological: Positive for numbness (at baseline, related to knee replacements. nothing new.). Negative for tingling, syncope, weakness, headaches and paresthesias.  All other systems reviewed and are negative.     Past Medical History  Diagnosis Date  . Diabetes mellitus   . Hypertension   . Arthritis   . Lower extremity edema   . Osteoarthritis   . Lumbar pain   . Complication of joint prosthesis     locks up and no PT after knee replacement  . Peripheral neuropathy     below ankle in both feet  . Renal failure     "after left knee replacement, fine now" some renal damage  . CVA (cerebral vascular accident) 2008    "small hemmoragic" right sided face drooping, no residual or follow up, can not confirmed because of MRI, questionable  . H/O bronchitis   . Pneumonia     h/o, vaccine given  . GERD (gastroesophageal reflux disease)     mild, occasional  . H/O hemorrhoids   . H/O measles   . H/O mumps   . H/O rubella   . Depression     very long time ago  . Umbilical hernia     "huge"  . Complication of anesthesia     after surgery: renal failure, hypotension, tachycardia, shock  . H/O cardiac arrest     from contrast dye    History   Social History  . Marital Status: Married    Spouse Name: N/A  Number of Children: N/A  . Years of Education: N/A   Occupational History  . Not on file.   Social History Main Topics  . Smoking status: Never Smoker   . Smokeless tobacco: Never Used  . Alcohol Use: No  . Drug Use: No  . Sexual Activity: Not on file   Other Topics Concern  . Not on file   Social History Narrative  . No narrative on file    Past Surgical History  Procedure Laterality Date    . Knee arthroscopy      bilateral  . Gastric bypass  before 2006    at [facility]  . Incontinence surgery  May 2006    SPARK, cystocelle, rectocelle  . Replacement total knee Left Oct 2006  . Tonsillectomy  age 42  . Abdominal hysterectomy  1993    abdominal, left ovaries  . Total knee arthroplasty Right 01/19/2013    Procedure: RIGHT TOTAL KNEE ARTHROPLASTY;  Surgeon: Gearlean Alf, MD;  Location: [facility];  Service: Orthopedics;  Laterality: Right;    No family history on file.  Allergies  Allergen Reactions  . Ibuprofen Swelling    Shortness of breath  . Iohexol Other (See Comments)    "coded" No contrast dye  . Nsaids Swelling    H/o of kidney failure    Current Outpatient Prescriptions on File Prior to Visit  Medication Sig Dispense Refill  . BD INSULIN SYRINGE ULTRAFINE 31G X 5/16" 0.3 ML MISC USE AS DIRECTED TWICE DAILY.  100 each  5  . docusate sodium (COLACE) 50 MG capsule Take 100 mg by mouth daily.       Marland Kitchen FREESTYLE TEST STRIPS test strip CHECK BLOOD SUGAR 4 TIMES A DAY.  100 each  5  . furosemide (LASIX) 40 MG tablet Take 40 mg by mouth 2 (two) times daily.      Marland Kitchen GLIPIZIDE XL 10 MG 24 hr tablet TAKE 1 TABLET TWICE DAILY.  180 tablet  3  . HYDROcodone-acetaminophen (NORCO/VICODIN) 5-325 MG per tablet Take 1 tablet by mouth at bedtime as needed for moderate pain.  30 tablet  0  . HYDROcodone-acetaminophen (NORCO/VICODIN) 5-325 MG per tablet Take 1-2 tablets by mouth every 6 (six) hours as needed.  20 tablet  0  . hydrocortisone (ANUSOL-HC) 2.5 % rectal cream Place 1 application rectally 2 (two) times daily as needed for hemorrhoids or itching.      . insulin NPH-regular Human (NOVOLIN 70/30) (70-30) 100 UNIT/ML injection Inject 23 Units into the skin 2 (two) times daily with a meal.      . Lancets (FREESTYLE) lancets USE AS DIRECTED  100 each  5  . metFORMIN (GLUCOPHAGE) 1000 MG tablet TAKE 1 TABLET TWICE DAILY WITH FOOD.  180 tablet  3  . methocarbamol (ROBAXIN) 500 MG  tablet Take 1 tablet (500 mg total) by mouth every 6 (six) hours as needed for muscle spasms.  80 tablet  0  . metronidazole (NORITATE) 1 % cream Apply 1 application topically daily.      Marland Kitchen omeprazole (PRILOSEC) 20 MG capsule Take 20 mg by mouth daily as needed (heartburn).       . ondansetron (ZOFRAN) 4 MG tablet Take 1 tablet (4 mg total) by mouth every 6 (six) hours.  12 tablet  0  . potassium chloride SA (K-DUR,KLOR-CON) 20 MEQ tablet Take 20 mEq by mouth 2 (two) times daily.      Marland Kitchen telmisartan (MICARDIS) 80 MG tablet TAKE 1 TABLET  ONCE DAILY.  90 tablet  3  . valACYclovir (VALTREX) 500 MG tablet Take 1 tablet by mouth as needed (flair).       . gabapentin (NEURONTIN) 100 MG capsule Take 1 capsule (100 mg total) by mouth 3 (three) times daily.  30 capsule  3  . [DISCONTINUED] potassium chloride (KLOR-CON) 20 MEQ packet Take 20 mEq by mouth 2 (two) times daily.         No current facility-administered medications on file prior to visit.    EXAM: BP 120/76  Pulse 84  Temp(Src) 97.9 F (36.6 C) (Oral)  Resp 18     Objective:   Physical Exam  Nursing note and vitals reviewed. Constitutional: She is oriented to person, place, and time. She appears well-developed and well-nourished. No distress.  HENT:  Head: Normocephalic and atraumatic.  Eyes: Conjunctivae and EOM are normal. Pupils are equal, round, and reactive to light.  Neck: Normal range of motion.  Cardiovascular: Normal rate, regular rhythm, normal heart sounds and intact distal pulses.   Pulmonary/Chest: Effort normal and breath sounds normal. No respiratory distress. She exhibits no tenderness.  Musculoskeletal: Normal range of motion. She exhibits no edema and no tenderness.  Gait- ambulates with cane  Neurological: She is alert and oriented to person, place, and time. She exhibits normal muscle tone.  SLR- negative bilaterally. Sensation- grossly intact bilaterally Reflexes- diminished bilat knee, sp knee replacement.    Skin: Skin is warm and dry. No rash noted. She is not diaphoretic. No erythema. No pallor.  Psychiatric: She has a normal mood and affect. Her behavior is normal. Judgment and thought content normal.    Lab Results  Component Value Date   WBC 15.0* 01/21/2013   HGB 10.3* 01/21/2013   HCT 31.5* 01/21/2013   PLT 393 01/21/2013   GLUCOSE 169* 05/25/2013   CHOL 78 05/25/2013   TRIG 64.0 05/25/2013   HDL 34.60* 05/25/2013   LDLCALC 31 05/25/2013   ALT 26 05/25/2013   AST 29 05/25/2013   NA 137 05/25/2013   K 4.0 05/25/2013   CL 101 05/25/2013   CREATININE 0.9 05/25/2013   BUN 15 05/25/2013   CO2 25 05/25/2013   TSH 5.07 04/09/2012   INR 1.04 01/12/2013   HGBA1C 7.1* 05/25/2013   MICROALBUR 0.3 05/25/2013         Assessment & Plan:  Yalena was seen today for back pain.  Diagnoses and associated orders for this visit:  Acute low back pain Comments: Appears to have resolved mostly. pt already has muscle relaxer at home for acute spasm. No additional therapy required. watchful waiting.  Diabetic polyneuropathy associated with type 2 diabetes mellitus Comments: Gabapentin worked well, however with side effects. Pt would like to retry at lower dose. We will try this.    Encouraged pt to avoid taking vicodin if possible for back spasm due to long history of being on this substance. Also advised pt to wait until after back pain resolves prior to starting gabapentin.  Pt will schedule physical for later this year prior to leaving today.  Return precautions provided, and patient handout on back pain, gabapentin.  Plan to follow up as needed, or for worsening or persistent symptoms despite treatment.  Patient Instructions  Continue your at home management of your acute back pain with muscle relaxant as needed for acute spasms. Try to avoid taking vicodin if possible.  We will try having you titrate back up the gabapentin and stop at 2 per day  to see if this is effective at treating your  pain, while preventing the adverse effects. Try 1 at bedtime for 1 week, then take 2 per day: 1 with dinner and 1 at bedtime. If side effects develop, stop taking and call office.  If emergency symptoms discussed during visit developed, seek medical attention immediately.  Followup as needed, or for worsening or persistent symptoms despite treatment.

## 2013-08-21 NOTE — Progress Notes (Signed)
Pre visit review using our clinic review tool, if applicable. No additional management support is needed unless otherwise documented below in the visit note. 

## 2013-09-14 ENCOUNTER — Other Ambulatory Visit: Payer: Self-pay | Admitting: Internal Medicine

## 2013-09-20 ENCOUNTER — Other Ambulatory Visit: Payer: Self-pay | Admitting: Internal Medicine

## 2013-10-08 ENCOUNTER — Other Ambulatory Visit: Payer: Self-pay | Admitting: Internal Medicine

## 2013-10-10 ENCOUNTER — Ambulatory Visit (INDEPENDENT_AMBULATORY_CARE_PROVIDER_SITE_OTHER): Payer: BC Managed Care – PPO | Admitting: Family Medicine

## 2013-10-10 ENCOUNTER — Encounter: Payer: Self-pay | Admitting: Family Medicine

## 2013-10-10 VITALS — BP 122/82 | HR 93 | Temp 98.1°F | Resp 16

## 2013-10-10 DIAGNOSIS — N390 Urinary tract infection, site not specified: Secondary | ICD-10-CM

## 2013-10-10 DIAGNOSIS — R300 Dysuria: Secondary | ICD-10-CM

## 2013-10-10 DIAGNOSIS — R8290 Unspecified abnormal findings in urine: Secondary | ICD-10-CM

## 2013-10-10 DIAGNOSIS — R82998 Other abnormal findings in urine: Secondary | ICD-10-CM

## 2013-10-10 LAB — POCT URINALYSIS DIPSTICK
Bilirubin, UA: NEGATIVE
Blood, UA: NEGATIVE
Glucose, UA: NEGATIVE
KETONES UA: NEGATIVE
NITRITE UA: POSITIVE
PROTEIN UA: NEGATIVE
Spec Grav, UA: 1.015
Urobilinogen, UA: >=8
pH, UA: 6

## 2013-10-10 MED ORDER — CIPROFLOXACIN HCL 250 MG PO TABS: 250.0000 mg | ORAL_TABLET | Freq: Two times a day (BID) | ORAL | Status: DC

## 2013-10-10 NOTE — Addendum Note (Signed)
Addended by: Kris Hartmann on: 10/10/2013 12:24 PM   Modules accepted: Orders

## 2013-10-10 NOTE — Progress Notes (Signed)
Chief Complaint  Patient presents with  . Urinary Tract Infection    HPI:  Dysuria: -started yesterday -symptoms: dysuria, odor to urine, frequency, urgency, has back pain chronically - unchanged -denies: fevers, nausea, vomiting, chills, abd or pelvic pain -hx of UTI ROS: See pertinent positives and negatives per HPI.  Past Medical History  Diagnosis Date  . Diabetes mellitus   . Hypertension   . Arthritis   . Lower extremity edema   . Osteoarthritis   . Lumbar pain   . Complication of joint prosthesis     locks up and no PT after knee replacement  . Peripheral neuropathy     below ankle in both feet  . Renal failure     "after left knee replacement, fine now" some renal damage  . CVA (cerebral vascular accident) 2008    "small hemmoragic" right sided face drooping, no residual or follow up, can not confirmed because of MRI, questionable  . H/O bronchitis   . Pneumonia     h/o, vaccine given  . GERD (gastroesophageal reflux disease)     mild, occasional  . H/O hemorrhoids   . H/O measles   . H/O mumps   . H/O rubella   . Depression     very long time ago  . Umbilical hernia     "huge"  . Complication of anesthesia     after surgery: renal failure, hypotension, tachycardia, shock  . H/O cardiac arrest     from contrast dye    Past Surgical History  Procedure Laterality Date  . Knee arthroscopy      bilateral  . Gastric bypass  before 2006    at [facility]  . Incontinence surgery  May 2006    SPARK, cystocelle, rectocelle  . Replacement total knee Left Oct 2006  . Tonsillectomy  age 60  . Abdominal hysterectomy  1993    abdominal, left ovaries  . Total knee arthroplasty Right 01/19/2013    Procedure: RIGHT TOTAL KNEE ARTHROPLASTY;  Surgeon: Gearlean Alf, MD;  Location: [facility];  Service: Orthopedics;  Laterality: Right;    History reviewed. No pertinent family history.  History   Social History  . Marital Status: Married    Spouse Name: N/A    Number  of Children: N/A  . Years of Education: N/A   Social History Main Topics  . Smoking status: Never Smoker   . Smokeless tobacco: Never Used  . Alcohol Use: No  . Drug Use: No  . Sexual Activity: None   Other Topics Concern  . None   Social History Narrative  . None    Current outpatient prescriptions:BD INSULIN SYRINGE ULTRAFINE 31G X 5/16" 0.3 ML MISC, USE AS DIRECTED TWICE DAILY., Disp: 100 each, Rfl: 5;  docusate sodium (COLACE) 50 MG capsule, Take 100 mg by mouth daily. , Disp: , Rfl: ;  FREESTYLE TEST STRIPS test strip, CHECK BLOOD SUGAR 4 TIMES A DAY., Disp: 100 each, Rfl: 5;  furosemide (LASIX) 40 MG tablet, TAKE 1 TABLET TWICE DAILY., Disp: 180 tablet, Rfl: 0 gabapentin (NEURONTIN) 100 MG capsule, Take 1 capsule (100 mg total) by mouth 3 (three) times daily., Disp: 30 capsule, Rfl: 3;  GLIPIZIDE XL 10 MG 24 hr tablet, TAKE 1 TABLET TWICE DAILY., Disp: 180 tablet, Rfl: 3;  HYDROcodone-acetaminophen (NORCO/VICODIN) 5-325 MG per tablet, Take 1 tablet by mouth at bedtime as needed for moderate pain., Disp: 30 tablet, Rfl: 0 HYDROcodone-acetaminophen (NORCO/VICODIN) 5-325 MG per tablet, Take 1-2 tablets  by mouth every 6 (six) hours as needed., Disp: 20 tablet, Rfl: 0;  hydrocortisone (ANUSOL-HC) 2.5 % rectal cream, Place 1 application rectally 2 (two) times daily as needed for hemorrhoids or itching., Disp: , Rfl: ;  Lancets (FREESTYLE) lancets, USE AS DIRECTED, Disp: 100 each, Rfl: 5 metFORMIN (GLUCOPHAGE) 1000 MG tablet, TAKE 1 TABLET TWICE DAILY WITH FOOD., Disp: 180 tablet, Rfl: 3;  methocarbamol (ROBAXIN) 500 MG tablet, Take 1 tablet (500 mg total) by mouth every 6 (six) hours as needed for muscle spasms., Disp: 80 tablet, Rfl: 0;  metronidazole (NORITATE) 1 % cream, Apply 1 application topically daily., Disp: , Rfl:  NOVOLIN 70/30 (70-30) 100 UNIT/ML injection, INJECT 23 UNITS SQ WITH BREAKFAST AND 23 UNITS WITH DINNER AND INCREASE AS DIRECTED., Disp: 10 mL, Rfl: 5;  omeprazole  (PRILOSEC) 20 MG capsule, Take 20 mg by mouth daily as needed (heartburn). , Disp: , Rfl: ;  ondansetron (ZOFRAN) 4 MG tablet, Take 1 tablet (4 mg total) by mouth every 6 (six) hours., Disp: 12 tablet, Rfl: 0 potassium chloride SA (K-DUR,KLOR-CON) 20 MEQ tablet, Take 20 mEq by mouth 2 (two) times daily., Disp: , Rfl: ;  PRILOSEC OTC 20 MG tablet, TAKE 1 TABLET ONCE DAILY., Disp: 30 tablet, Rfl: 1;  telmisartan (MICARDIS) 80 MG tablet, TAKE 1 TABLET ONCE DAILY., Disp: 90 tablet, Rfl: 3;  valACYclovir (VALTREX) 500 MG tablet, Take 1 tablet by mouth as needed (flair). , Disp: , Rfl:  ciprofloxacin (CIPRO) 250 MG tablet, Take 1 tablet (250 mg total) by mouth 2 (two) times daily., Disp: 10 tablet, Rfl: 0;  [DISCONTINUED] potassium chloride (KLOR-CON) 20 MEQ packet, Take 20 mEq by mouth 2 (two) times daily.  , Disp: , Rfl:   EXAM:  Filed Vitals:   10/10/13 1131  BP: 122/82  Pulse: 93  Temp: 98.1 F (36.7 C)  Resp: 16    Body mass index is 0.00 kg/(m^2).  GENERAL: vitals reviewed and listed above, alert, oriented, appears well hydrated and in no acute distress  HEENT: atraumatic, conjunttiva clear, no obvious abnormalities on inspection of external nose and ears  NECK: no obvious masses on inspection  LUNGS: clear to auscultation bilaterally, no wheezes, rales or rhonchi, good air movement  CV: HRRR, no peripheral edema  ABD: BS+, soft, NTTP, no CVA TTP  MS: moves all extremities without noticeable abnormality  PSYCH: pleasant and cooperative, no obvious depression or anxiety  ASSESSMENT AND PLAN:  Discussed the following assessment and plan:  Foul smelling urine - Plan: POCT urinalysis dipstick  Dysuria  UTI (lower urinary tract infection) - Plan: ciprofloxacin (CIPRO) 250 MG tablet -discussed options and risks, interactions discussed, opted for low dose cipro empirically, culture sent, return precuations -Patient advised to return or notify a doctor immediately if symptoms  worsen or persist or new concerns arise.  There are no Patient Instructions on file for this visit.   Colin Benton R.

## 2013-10-10 NOTE — Progress Notes (Signed)
Pre visit review using our clinic review tool, if applicable. No additional management support is needed unless otherwise documented below in the visit note. 

## 2013-11-04 ENCOUNTER — Ambulatory Visit
Admission: RE | Admit: 2013-11-04 | Discharge: 2013-11-04 | Disposition: A | Discharge status: Home / Self Care | Payer: BC Managed Care – PPO | Source: Ambulatory Visit | Attending: Sports Medicine | Admitting: Sports Medicine

## 2013-11-04 ENCOUNTER — Other Ambulatory Visit: Payer: Self-pay | Admitting: Sports Medicine

## 2013-11-04 DIAGNOSIS — M25552 Pain in left hip: Secondary | ICD-10-CM

## 2013-11-05 NOTE — Telephone Encounter (Signed)
error 

## 2013-11-24 ENCOUNTER — Encounter: Payer: Self-pay | Admitting: Family Medicine

## 2013-11-24 ENCOUNTER — Ambulatory Visit (INDEPENDENT_AMBULATORY_CARE_PROVIDER_SITE_OTHER): Payer: BC Managed Care – PPO | Admitting: Family Medicine

## 2013-11-24 VITALS — BP 138/88 | Temp 98.1°F | Wt 268.0 lb

## 2013-11-24 DIAGNOSIS — E1149 Type 2 diabetes mellitus with other diabetic neurological complication: Secondary | ICD-10-CM

## 2013-11-24 DIAGNOSIS — G894 Chronic pain syndrome: Secondary | ICD-10-CM

## 2013-11-24 DIAGNOSIS — I10 Essential (primary) hypertension: Secondary | ICD-10-CM

## 2013-11-24 DIAGNOSIS — Z23 Encounter for immunization: Secondary | ICD-10-CM

## 2013-11-24 DIAGNOSIS — D649 Anemia, unspecified: Secondary | ICD-10-CM | POA: Insufficient documentation

## 2013-11-24 DIAGNOSIS — E1140 Type 2 diabetes mellitus with diabetic neuropathy, unspecified: Secondary | ICD-10-CM

## 2013-11-24 DIAGNOSIS — R748 Abnormal levels of other serum enzymes: Secondary | ICD-10-CM | POA: Insufficient documentation

## 2013-11-24 DIAGNOSIS — L719 Rosacea, unspecified: Secondary | ICD-10-CM | POA: Insufficient documentation

## 2013-11-24 DIAGNOSIS — B029 Zoster without complications: Secondary | ICD-10-CM | POA: Insufficient documentation

## 2013-11-24 LAB — CBC
HCT: 36.9 % (ref 36.0–46.0)
Hemoglobin: 11.8 g/dL — ABNORMAL LOW (ref 12.0–15.0)
MCHC: 31.9 g/dL (ref 30.0–36.0)
MCV: 83.2 fl (ref 78.0–100.0)
Platelets: 342 10*3/uL (ref 150.0–400.0)
RBC: 4.44 Mil/uL (ref 3.87–5.11)
RDW: 16.8 % — ABNORMAL HIGH (ref 11.5–15.5)
WBC: 8.2 10*3/uL (ref 4.0–10.5)

## 2013-11-24 LAB — COMPREHENSIVE METABOLIC PANEL
ALBUMIN: 3.2 g/dL — AB (ref 3.5–5.2)
ALT: 36 U/L — ABNORMAL HIGH (ref 0–35)
AST: 46 U/L — ABNORMAL HIGH (ref 0–37)
Alkaline Phosphatase: 87 U/L (ref 39–117)
BUN: 17 mg/dL (ref 6–23)
CHLORIDE: 101 meq/L (ref 96–112)
CO2: 22 mEq/L (ref 19–32)
Calcium: 9.5 mg/dL (ref 8.4–10.5)
Creatinine, Ser: 1 mg/dL (ref 0.4–1.2)
GFR: 56.17 mL/min — AB (ref >60.00)
GLUCOSE: 218 mg/dL — AB (ref 70–99)
POTASSIUM: 4.5 meq/L (ref 3.5–5.1)
Sodium: 134 mEq/L — ABNORMAL LOW (ref 135–145)
Total Bilirubin: 0.6 mg/dL (ref 0.2–1.2)
Total Protein: 7.9 g/dL (ref 6.0–8.3)

## 2013-11-24 LAB — HEMOGLOBIN A1C: Hgb A1c MFr Bld: 7.9 % — ABNORMAL HIGH (ref 4.6–6.5)

## 2013-11-24 NOTE — Progress Notes (Signed)
Paula Reddish, MD Phone: [phone]  Subjective:  Patient presents today to establish care with me as their new primary care provider. Patient was formerly a patient of Dr. Leanne Chang. Chief complaint-noted.   Chronic pain from diabetic neuropathy-reasonable control Patient did not tolerate trial of gabapentin 100 mg twice a day. She had blurry vision and coordination issues with this. She does have chronic pain but typically only takes one hydrocodone or Percocet per day on average. ROS-no worsening numbness.  DIABETES Type II-worsening control (did not have a1c at time of visit)  Lab Results  Component Value Date   HGBA1C 7.9* 11/24/2013   HGBA1C 7.1* 05/25/2013   HGBA1C 7.8* 12/26/2012   Medications taking and tolerating-yes, novolin 70/30 23 units BID, metformin, glipizide.  Diet-a lot of stressors recently and poor eating Regular Exercise-multiple ortho visits for bursitis so has beenlimited On Aspirin-consider starting next visit On statin-no, LDL <50.  Daily foot monitoring-yes  ROS- Denies Polyuria,Polydipsia, nocturia, Vision changes. Denies  Hypoglycemia symptoms (shaky, sweaty, hungry, weak anxious, tremor, palpitations, confusion, behavior change).   Hypertension-well controlled  BP Readings from Last 3 Encounters:  11/24/13 138/88  10/10/13 122/82  08/21/13 120/76  Home BP monitoring-yes and typically in 120s 130s over 70s or 80s.  Compliant with medications-yes without side effects ROS-Denies any CP, HA, SOB, blurry vision  The following were reviewed and entered/updated in epic: Past Medical History  Diagnosis Date  . Diabetes mellitus   . Hypertension   . Arthritis   . Lower extremity edema   . Osteoarthritis   . Lumbar pain   . Complication of joint prosthesis     locks up and no PT after knee replacement  . Peripheral neuropathy     below ankle in both feet  . Renal failure     "after left knee replacement, fine now" some renal damage  . CVA  (cerebral vascular accident) 2008    "small hemmoragic" right sided face drooping, no residual or follow up, can not confirmed because of MRI, questionable  . H/O bronchitis   . Pneumonia     h/o, vaccine given  . GERD (gastroesophageal reflux disease)     mild, occasional  . H/O hemorrhoids   . H/O measles   . H/O mumps   . H/O rubella   . Depression     very long time ago  . Umbilical hernia     "huge"  . Complication of anesthesia     after surgery: renal failure, hypotension, tachycardia, shock  . H/O cardiac arrest     from contrast dye   Patient Active Problem List   Diagnosis Date Noted  . Chronic pain syndrome 04/20/2009    Priority: High  . CVA (cerebral infarction) 01/05/2008    Priority: High  . Type 2 diabetes mellitus with neurological manifestations, controlled 11/01/2005    Priority: High  . Anemia 11/24/2013    Priority: Medium  . Abnormal transaminases 11/24/2013    Priority: Medium  . CKD (chronic kidney disease), stage III 01/11/2010    Priority: Medium  . Edema 05/13/2009    Priority: Medium  . Obesity 10/23/2007    Priority: Medium  . Depression 11/01/2005    Priority: Medium  . Essential hypertension 11/01/2005    Priority: Medium  . Rosacea 11/24/2013    Priority: Low  . Shingles 11/24/2013    Priority: Low  . OA (osteoarthritis) of knee 12/28/2012    Priority: Low  . Ventral hernia 01/17/2010  Priority: Low  . GERD 04/12/2009    Priority: Low   Past Surgical History  Procedure Laterality Date  . Knee arthroscopy      bilateral  . Gastric bypass  before 2006    at [facility]  . Incontinence surgery  May 2006    SPARK, cystocelle, rectocelle  . Replacement total knee Left Oct 2006  . Tonsillectomy  age 44  . Abdominal hysterectomy  1993    abdominal, left ovaries  . Total knee arthroplasty Right 01/19/2013    Procedure: RIGHT TOTAL KNEE ARTHROPLASTY;  Surgeon: Gearlean Alf, MD;  Location: [facility];  Service: Orthopedics;   Laterality: Right;   Medications- reviewed and updated Current Outpatient Prescriptions  Medication Sig Dispense Refill  . BD INSULIN SYRINGE ULTRAFINE 31G X 5/16" 0.3 ML MISC USE AS DIRECTED TWICE DAILY.  100 each  5  . docusate sodium (COLACE) 50 MG capsule Take 100 mg by mouth daily.       Marland Kitchen FREESTYLE TEST STRIPS test strip CHECK BLOOD SUGAR 4 TIMES A DAY.  100 each  5  . furosemide (LASIX) 40 MG tablet TAKE 1 TABLET TWICE DAILY.  180 tablet  0  . GLIPIZIDE XL 10 MG 24 hr tablet TAKE 1 TABLET TWICE DAILY.  180 tablet  3  . Lancets (FREESTYLE) lancets USE AS DIRECTED  100 each  5  . metFORMIN (GLUCOPHAGE) 1000 MG tablet TAKE 1 TABLET TWICE DAILY WITH FOOD.  180 tablet  3  . metronidazole (NORITATE) 1 % cream Apply 1 application topically daily.      Marland Kitchen NOVOLIN 70/30 (70-30) 100 UNIT/ML injection INJECT 23 UNITS SQ WITH BREAKFAST AND 23 UNITS WITH DINNER AND INCREASE AS DIRECTED.  10 mL  5  . omeprazole (PRILOSEC) 20 MG capsule Take 20 mg by mouth daily as needed (heartburn).       . potassium chloride SA (K-DUR,KLOR-CON) 20 MEQ tablet Take 20 mEq by mouth 2 (two) times daily.      Marland Kitchen PRILOSEC OTC 20 MG tablet TAKE 1 TABLET ONCE DAILY.  30 tablet  1  . telmisartan (MICARDIS) 80 MG tablet TAKE 1 TABLET ONCE DAILY.  90 tablet  3  . valACYclovir (VALTREX) 500 MG tablet Take 1 tablet by mouth as needed (flair).       Marland Kitchen HYDROcodone-acetaminophen (NORCO/VICODIN) 5-325 MG per tablet Take 1-2 tablets by mouth every 6 (six) hours as needed.  20 tablet  0  . hydrocortisone (ANUSOL-HC) 2.5 % rectal cream Place 1 application rectally 2 (two) times daily as needed for hemorrhoids or itching.      . methocarbamol (ROBAXIN) 500 MG tablet Take 1 tablet (500 mg total) by mouth every 6 (six) hours as needed for muscle spasms.  80 tablet  0  . [DISCONTINUED] potassium chloride (KLOR-CON) 20 MEQ packet Take 20 mEq by mouth 2 (two) times daily.         No current facility-administered medications for this visit.      Allergies-reviewed and updated Allergies  Allergen Reactions  . Ibuprofen Swelling    Shortness of breath  . Iohexol Other (See Comments)    "coded" No contrast dye  . Nsaids Swelling    H/o of kidney failure    History   Social History  . Marital Status: Married    Spouse Name: N/A    Number of Children: N/A  . Years of Education: N/A   Social History Main Topics  . Smoking status: Never Smoker   .  Smokeless tobacco: Never Used  . Alcohol Use: No  . Drug Use: No  . Sexual Activity: None   Other Topics Concern  . None   Social History Narrative   Married (husband Cristie Hem in [place] practice) [phone].  3 kids. 4 grandkids.       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     ROS--See HPI   Objective: BP 138/88  Temp(Src) 98.1 F (36.7 C)  Wt 268 lb (121.564 kg) Gen: NAD, resting comfortably   Results for orders placed in visit on 11/24/13 (from the past 24 hour(s))  HEMOGLOBIN A1C     Status: Abnormal   Collection Time    11/24/13 10:45 AM      Result Value Ref Range   Hemoglobin A1C 7.9 (*) 4.6 - 6.5 %  COMPREHENSIVE METABOLIC PANEL     Status: Abnormal   Collection Time    11/24/13 10:45 AM      Result Value Ref Range   Sodium 134 (*) 135 - 145 mEq/L   Potassium 4.5  3.5 - 5.1 mEq/L   Chloride 101  96 - 112 mEq/L   CO2 22  19 - 32 mEq/L   Glucose, Bld 218 (*) 70 - 99 mg/dL   BUN 17  6 - 23 mg/dL   Creatinine, Ser 1.0  0.4 - 1.2 mg/dL   Total Bilirubin 0.6  0.2 - 1.2 mg/dL   Alkaline Phosphatase 87  39 - 117 U/L   AST 46 (*) 0 - 37 U/L   ALT 36 (*) 0 - 35 U/L   Total Protein 7.9  6.0 - 8.3 g/dL   Albumin 3.2 (*) 3.5 - 5.2 g/dL   Calcium 9.5  8.4 - 10.5 mg/dL   GFR 56.17 (*) >60.00 mL/min  CBC     Status: Abnormal   Collection Time    11/24/13 10:45 AM      Result Value Ref Range   WBC 8.2  4.0 - 10.5 K/uL   RBC 4.44  3.87 - 5.11 Mil/uL    Platelets 342.0  150.0 - 400.0 K/uL   Hemoglobin 11.8 (*) 12.0 - 15.0 g/dL   HCT 36.9  36.0 - 46.0 %   MCV 83.2  78.0 - 100.0 fl   MCHC 31.9  30.0 - 36.0 g/dL   RDW 16.8 (*) 11.5 - 15.5 %    Assessment/Plan:  Chronic pain syndrome Reasonable control with one Percocet or Vicodin per day on average. Willing to continue this regimen but discussed if had increasing needs would need to consider alternative. Unfortunately gabapentin had too strong of side effects  Type 2 diabetes mellitus with neurological manifestations, controlled Worsening control with a1c up to 7.9. Likely to titrate up Novolin-would need to have a log of CBGs to do this off of and have requested this either for phone call titration or a visit.   Essential hypertension Good control on micardis 80mg  and lasix 40mg . Initial BP elevated but patient stressful situation getting here-asked her to check some blood pressures at home and if above 140/90 come see [place] for repeat in office check.   Anemia Noted on CBC today. Plan to have patient back in to investigate further.   Abnormal transaminases Noted on labs today-likely fatty liver.

## 2013-11-24 NOTE — Assessment & Plan Note (Signed)
Good control on micardis 80mg  and lasix 40mg . Initial BP elevated but patient stressful situation getting here-asked her to check some blood pressures at home and if above 140/90 come see [place] for repeat in office check.

## 2013-11-24 NOTE — Patient Instructions (Signed)
Wonderful to meet you!   Lab work today.   Health Maintenance Due  Topic Date Due  . Mammogram -please get this at your convenience 10/09/2013  . Hemoglobin A1c -today 11/24/2013

## 2013-11-24 NOTE — Assessment & Plan Note (Signed)
Noted on labs today-likely fatty liver.

## 2013-11-24 NOTE — Assessment & Plan Note (Signed)
Reasonable control with one Percocet or Vicodin per day on average. Willing to continue this regimen but discussed if had increasing needs would need to consider alternative. Unfortunately gabapentin had too strong of side effects

## 2013-11-24 NOTE — Assessment & Plan Note (Signed)
Noted on CBC today. Plan to have patient back in to investigate further.

## 2013-11-24 NOTE — Assessment & Plan Note (Signed)
Worsening control with a1c up to 7.9. Likely to titrate up Novolin-would need to have a log of CBGs to do this off of and have requested this either for phone call titration or a visit.

## 2013-12-04 ENCOUNTER — Ambulatory Visit (INDEPENDENT_AMBULATORY_CARE_PROVIDER_SITE_OTHER): Payer: BC Managed Care – PPO | Admitting: Family Medicine

## 2013-12-04 ENCOUNTER — Encounter: Payer: Self-pay | Admitting: Family Medicine

## 2013-12-04 VITALS — BP 138/70 | Temp 97.9°F | Wt 268.0 lb

## 2013-12-04 DIAGNOSIS — E1140 Type 2 diabetes mellitus with diabetic neuropathy, unspecified: Secondary | ICD-10-CM

## 2013-12-04 DIAGNOSIS — E1149 Type 2 diabetes mellitus with other diabetic neurological complication: Secondary | ICD-10-CM

## 2013-12-04 DIAGNOSIS — D649 Anemia, unspecified: Secondary | ICD-10-CM

## 2013-12-04 NOTE — Assessment & Plan Note (Signed)
Mild poor control. Titrate AM 70/30 up to 25 units and continue 23 units in PM. COntinue metformin 1g BID, glipizide 10mg  BID.

## 2013-12-04 NOTE — Progress Notes (Signed)
Garret Reddish, MD Phone: [phone]  Subjective:   [name] is a 68 y.o. year old very pleasant female patient who presents with the following:  DIABETES Type II-mild poor conrol  Lab Results  Component Value Date   HGBA1C 7.9* 11/24/2013   HGBA1C 7.1* 05/25/2013   HGBA1C 7.8* 12/26/2012   Medications taking and tolerating-yes, Glipizide 10mg  BID, metformin 1000mg  BID, 70/30 23 units in AM and PM.  Blood Sugars per patient-fasting-109-139. Premeal or post meal 152-226 (126 low Regular Exercise-starting to work on exercise bike.  On Aspirin-consider advising next visit On statin-no, ldl <40.  Daily foot monitoring-yes ROS- Denies hypoglycemic symptoms, blurry vision.  anemia -admits to fatigue and sob after surgery as she has been much less active. States cannot go under anesthesia due to life threatening complications in the past. Hgb near normal at 11.8 with 12 LLN.  ROS- no blood in stool, no melena, no blood in urine  Past Medical History- Patient Active Problem List   Diagnosis Date Noted  . Chronic pain syndrome 04/20/2009    Priority: High  . CVA (cerebral infarction) 01/05/2008    Priority: High  . Type 2 diabetes mellitus with neurological manifestations, controlled 11/01/2005    Priority: High  . Anemia 11/24/2013    Priority: Medium  . Abnormal transaminases 11/24/2013    Priority: Medium  . CKD (chronic kidney disease), stage III 01/11/2010    Priority: Medium  . Edema 05/13/2009    Priority: Medium  . Obesity 10/23/2007    Priority: Medium  . Depression 11/01/2005    Priority: Medium  . Essential hypertension 11/01/2005    Priority: Medium  . Rosacea 11/24/2013    Priority: Low  . Shingles 11/24/2013    Priority: Low  . OA (osteoarthritis) of knee 12/28/2012    Priority: Low  . Ventral hernia 01/17/2010    Priority: Low  . GERD 04/12/2009    Priority: Low   Medications- reviewed and updated Current Outpatient Prescriptions    Medication Sig Dispense Refill  . BD INSULIN SYRINGE ULTRAFINE 31G X 5/16" 0.3 ML MISC USE AS DIRECTED TWICE DAILY. 100 each 5  . FREESTYLE TEST STRIPS test strip CHECK BLOOD SUGAR 4 TIMES A DAY. 100 each 5  . furosemide (LASIX) 40 MG tablet TAKE 1 TABLET TWICE DAILY. 180 tablet 0  . GLIPIZIDE XL 10 MG 24 hr tablet TAKE 1 TABLET TWICE DAILY. 180 tablet 3  . Lancets (FREESTYLE) lancets USE AS DIRECTED 100 each 5  . metFORMIN (GLUCOPHAGE) 1000 MG tablet TAKE 1 TABLET TWICE DAILY WITH FOOD. 180 tablet 3  . methocarbamol (ROBAXIN) 500 MG tablet Take 1 tablet (500 mg total) by mouth every 6 (six) hours as needed for muscle spasms. 80 tablet 0  . metronidazole (NORITATE) 1 % cream Apply 1 application topically daily.    Marland Kitchen NOVOLIN 70/30 (70-30) 100 UNIT/ML injection INJECT 23 UNITS SQ WITH BREAKFAST AND 23 UNITS WITH DINNER AND INCREASE AS DIRECTED. 10 mL 5  . omeprazole (PRILOSEC) 20 MG capsule Take 20 mg by mouth daily as needed (heartburn).     . potassium chloride SA (K-DUR,KLOR-CON) 20 MEQ tablet Take 20 mEq by mouth 2 (two) times daily.    Marland Kitchen PRILOSEC OTC 20 MG tablet TAKE 1 TABLET ONCE DAILY. 30 tablet 1  . telmisartan (MICARDIS) 80 MG tablet TAKE 1 TABLET ONCE DAILY. 90 tablet 3  . docusate sodium (COLACE) 50 MG capsule Take 100 mg by mouth daily.     Marland Kitchen  HYDROcodone-acetaminophen (NORCO/VICODIN) 5-325 MG per tablet Take 1-2 tablets by mouth every 6 (six) hours as needed. 20 tablet 0  . hydrocortisone (ANUSOL-HC) 2.5 % rectal cream Place 1 application rectally 2 (two) times daily as needed for hemorrhoids or itching.    . valACYclovir (VALTREX) 500 MG tablet Take 1 tablet by mouth as needed (flair).     . [DISCONTINUED] potassium chloride (KLOR-CON) 20 MEQ packet Take 20 mEq by mouth 2 (two) times daily.       No current facility-administered medications for this visit.    Objective: BP 138/70 mmHg  Temp(Src) 97.9 F (36.6 C)  Wt 268 lb (121.564 kg) Gen: NAD, resting comfortably, mobidly  obese   Assessment/Plan:  Type 2 diabetes mellitus with neurological manifestations, controlled Mild poor control. Titrate AM 70/30 up to 25 units and continue 23 units in PM. COntinue metformin 1g BID, glipizide 10mg  BID.   Anemia My biggest concern is for potential colon cancer risk. Patient adamant that due to anesthesia issues she cannot get a colonoscopy. We discussed this could mean we are missing a colon cancer. For now, we will simply recheck CBC before next visit. We can consider doing stool cards though not clear this would help as patient would refuse follow up testing due to risk. No oother obvious outlets for light blood loss.    >50% of 22 minute office visit was spent on counseling (diabetes management) and coordination of care  Return precautions advised.   Future labs Orders Placed This Encounter  Procedures  . Hemoglobin A1c    [place]    Standing Status: Future     Number of Occurrences:      Standing Expiration Date: 12/05/2014  . CBC    [place]    Standing Status: Future     Number of Occurrences:      Standing Expiration Date: 12/05/2014

## 2013-12-04 NOTE — Assessment & Plan Note (Addendum)
My biggest concern is for potential colon cancer risk. Patient adamant that due to anesthesia issues she cannot get a colonoscopy. We discussed this could mean we are missing a colon cancer. For now, we will simply recheck CBC before next visit. We can consider doing stool cards though not clear this would help as patient would refuse follow up testing due to risk. No oother obvious outlets for light blood loss.

## 2013-12-04 NOTE — Patient Instructions (Addendum)
Increase morning insulin 70/30 to 25 units and continue 23 units in the evening. Start with 24 units and as long as pre dinner blood sugar >90, increase to 25 units. Continue to work on exercise. See me back in January.   Come in for blood test 1 week before.   I want to do a colonoscopy but know that is not possible. We could consider stool cards to see if there is any blood in the stool.   Letter written about your seatbelt

## 2014-01-15 ENCOUNTER — Telehealth: Payer: Self-pay | Admitting: Family Medicine

## 2014-01-15 NOTE — Telephone Encounter (Signed)
Is this ok for Dr. Yong Channel?

## 2014-01-15 NOTE — Telephone Encounter (Signed)
Yes, may fill x 1. Please let patient know that we really need to either have either me or orthopedics prescribe and not both. If she chooses to fill through here, will need visit to sign pain contract and regular appointments for pain management not associated with other chronic medical conditions.

## 2014-01-15 NOTE — Telephone Encounter (Signed)
Patient would like a re-fill on HYDROcodone-acetaminophen (NORCO/VICODIN) 5-325 MG per tablet. °

## 2014-01-18 ENCOUNTER — Telehealth: Payer: Self-pay | Admitting: Family Medicine

## 2014-01-18 MED ORDER — HYDROCODONE-ACETAMINOPHEN 5-325 MG PO TABS: 1.0000 | ORAL_TABLET | Freq: Four times a day (QID) | ORAL | Status: DC | PRN

## 2014-01-18 NOTE — Telephone Encounter (Signed)
Dr. Yong Channel pt would like for you to continue to Rx her Hydrocodone, made pt aware of your recommendations in previous phone note.

## 2014-01-18 NOTE — Telephone Encounter (Signed)
Pt spoke w/ dr alusio's office.  She has requested dr alusio's office send [place] a fax stating her hydrocodone has been dc'd. Pt has neuropathy and dr Maureen Ralphs office does not prescribe this med for pt.

## 2014-01-18 NOTE — Telephone Encounter (Signed)
Noted medication upfront and pt notified

## 2014-01-18 NOTE — Telephone Encounter (Signed)
Pt.notified

## 2014-01-18 NOTE — Telephone Encounter (Signed)
Yes I will continue but need patient in for visits focused on pain management every 3 months (depending on frequency of use but at minimum every 6 months). We will need to sign pain contract and will use random urine tests as well as part of standard of care for narcotic management.

## 2014-01-25 ENCOUNTER — Other Ambulatory Visit: Payer: Self-pay | Admitting: Internal Medicine

## 2014-02-07 ENCOUNTER — Other Ambulatory Visit: Payer: Self-pay | Admitting: Internal Medicine

## 2014-02-24 ENCOUNTER — Encounter: Payer: Self-pay | Admitting: Family Medicine

## 2014-02-24 ENCOUNTER — Ambulatory Visit (INDEPENDENT_AMBULATORY_CARE_PROVIDER_SITE_OTHER): Payer: BC Managed Care – PPO | Admitting: Family Medicine

## 2014-02-24 VITALS — BP 158/74 | Temp 98.1°F

## 2014-02-24 DIAGNOSIS — E1140 Type 2 diabetes mellitus with diabetic neuropathy, unspecified: Secondary | ICD-10-CM

## 2014-02-24 DIAGNOSIS — G894 Chronic pain syndrome: Secondary | ICD-10-CM

## 2014-02-24 DIAGNOSIS — E1149 Type 2 diabetes mellitus with other diabetic neurological complication: Secondary | ICD-10-CM

## 2014-02-24 DIAGNOSIS — I10 Essential (primary) hypertension: Secondary | ICD-10-CM

## 2014-02-24 MED ORDER — HYDROCODONE-ACETAMINOPHEN 5-325 MG PO TABS: 0.5000 | ORAL_TABLET | Freq: Two times a day (BID) | ORAL | Status: DC | PRN

## 2014-02-24 NOTE — Progress Notes (Signed)
Garret Reddish, MD Phone: [phone]  Subjective:   [name] is a 69 y.o. year old very pleasant female patient who presents with the following:  DIABETES Type II-mild poor control  Lab Results  Component Value Date   HGBA1C 7.9* 11/24/2013   HGBA1C 7.1* 05/25/2013   HGBA1C 7.8* 12/26/2012  Medications taking and tolerating-yes, AM 70/30 up to 25 units and continue 23 units in PM. COntinue metformin 1g BID, glipizide 10mg  BID Blood Sugars per patient-fasting-100-135. Evening 139-under 200. Average 180s Regular Exercise-aerodyne bike 10 minutes trying to do daily, doing some walking in stores On Aspirin-no, severe nsaid allergy On statin-no, ldl 31 Daily foot monitoring-yes  ROS- Denies Polyuria,Polydipsia, nocturia, Vision changes. endorses feet numbness/pain/tingling. Denies Hypoglycemia symptoms (shaky, sweaty, hungry, weak anxious, tremor, palpitations, confusion, behavior change).   Hypertension-poor control today on micardis 80mg  and lasix 40mg   BP Readings from Last 3 Encounters:  02/24/14 158/74  12/04/13 138/70  11/24/13 138/88  Home BP monitoring-no but has cuff Compliant with medications-yes without side effects ROS-Denies any CP, HA, SOB, blurry vision. chronic LE edema.   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 Takes total of 1 vicodin a day. Gabapentin 100mg  BID caused blurry vision and coordination issues. Lyrica would likely be even less tolerable.  ROS- complains of numbness, burning, tingling in bilateral feet with no progression past knees. , no car accidents.   ROS additional- does complain of some bloating feeling that seems to be worse off PPI. Family history ovarian cancer and concerned.   Past Medical History- Patient Active Problem List   Diagnosis Date Noted  . Chronic pain syndrome 04/20/2009    Priority: High  . CVA (cerebral infarction)  01/05/2008    Priority: High  . Type 2 diabetes mellitus with neurological manifestations, controlled 11/01/2005    Priority: High  . Anemia 11/24/2013    Priority: Medium  . Abnormal transaminases 11/24/2013    Priority: Medium  . CKD (chronic kidney disease), stage III 01/11/2010    Priority: Medium  . Edema 05/13/2009    Priority: Medium  . Obesity 10/23/2007    Priority: Medium  . Depression 11/01/2005    Priority: Medium  . Essential hypertension 11/01/2005    Priority: Medium  . Rosacea 11/24/2013    Priority: Low  . Shingles 11/24/2013    Priority: Low  . OA (osteoarthritis) of knee 12/28/2012    Priority: Low  . Ventral hernia 01/17/2010    Priority: Low  . GERD 04/12/2009    Priority: Low   Medications- reviewed and updated Current Outpatient Prescriptions  Medication Sig Dispense Refill  . BD INSULIN SYRINGE ULTRAFINE 31G X 5/16" 0.3 ML MISC USE AS DIRECTED TWICE DAILY. 100 each 5  . docusate sodium (COLACE) 50 MG capsule Take 100 mg by mouth daily.     Marland Kitchen FREESTYLE TEST STRIPS test strip CHECK BLOOD SUGAR 4 TIMES A DAY. 100 each 5  . furosemide (LASIX) 40 MG tablet TAKE 1 TABLET TWICE DAILY. 180 tablet 0  . GLIPIZIDE XL 10 MG 24 hr tablet TAKE 1 TABLET TWICE DAILY. 180 tablet 3  . Lancets (FREESTYLE) lancets USE AS DIRECTED 100 each 5  . metFORMIN (GLUCOPHAGE) 1000 MG tablet TAKE 1 TABLET TWICE DAILY WITH FOOD. 180 tablet 3  . methocarbamol (ROBAXIN) 500 MG tablet Take 1 tablet (500 mg total) by mouth every 6 (six) hours as needed for muscle spasms. 80 tablet 0  .  metroNIDAZOLE (METROCREAM) 0.75 % cream APPLY TO AFFECTED AREA TWICE A DAY. 45 g 1  . NOVOLIN 70/30 (70-30) 100 UNIT/ML injection INJECT 23 UNITS SQ WITH BREAKFAST AND 23 UNITS WITH DINNER AND INCREASE AS DIRECTED. 10 mL 2  . omeprazole (PRILOSEC) 20 MG capsule Take 20 mg by mouth daily as needed (heartburn).     . potassium chloride SA (K-DUR,KLOR-CON) 20 MEQ tablet Take 20 mEq by mouth 2 (two) times  daily.    Marland Kitchen PRILOSEC OTC 20 MG tablet TAKE 1 TABLET ONCE DAILY. 30 tablet 1  . telmisartan (MICARDIS) 80 MG tablet TAKE 1 TABLET ONCE DAILY. 90 tablet 3  . valACYclovir (VALTREX) 500 MG tablet Take 1 tablet by mouth as needed (flair).     Marland Kitchen HYDROcodone-acetaminophen (NORCO/VICODIN) 5-325 MG per tablet Take 1-2 tablets by mouth every 6 (six) hours as needed. (Patient not taking: Reported on 02/24/2014) 20 tablet 0  . hydrocortisone (ANUSOL-HC) 2.5 % rectal cream Place 1 application rectally 2 (two) times daily as needed for hemorrhoids or itching.    . [DISCONTINUED] potassium chloride (KLOR-CON) 20 MEQ packet Take 20 mEq by mouth 2 (two) times daily.       Objective: BP 158/74 mmHg  Temp(Src) 98.1 F (36.7 C)  Wt  Gen: NAD, resting comfortably, obese CV: RRR no murmurs rubs or gallops Lungs: CTAB no crackles, wheeze, rhonchi Abdomen: soft/nontender/nondistended/normal bowel sounds. No rebound or guarding. Protuberant. Difficult to examine for hepatosplenomegaly Ext: 1+ pitting edema Skin: warm, dry, no rash   Assessment/Plan:  Type 2 diabetes mellitus with neurological manifestations, controlled Last a1c 7.9. Advised a1c today but patient admits did not eat well over holidays. She wants to defer for 3 more months. Her CBGs actually seem reasonable in AM but with nighttime CBGs averaging around 180. Likely have room to increase insulin next visit. For now, continue Novolin 70/30 25 am and 23 units PM, metformin 1g BID, glipizide 10mg  BID. Consider stopping glipizide.    Essential hypertension Reasonable control last 2 visits. Patient admits to increased pain today and may be elevated due to that. We discussed addiing medication today but ultimately patient requested to monitor at home and if elevated will follow up sooner than 3 months.    Chronic pain syndrome 3 month rx for vicodin 1 pill per day-split or taken in whole but still only 1. Did not tolerate gabapentin and doubt would  tolerate lyrica. Only other thought would be to try a 1/2 dose of 100mg  gabapentin at night to see if tolerates and can build up.     Return precautions advised and 3 month visit planned. Labs next visit a1c, cmet, cbc. Consider stool cards  (especially if anemia worsens) or ca 125 (if bloating persists) as patient declines labs today.   Meds ordered this encounter  Medications  . HYDROcodone-acetaminophen (NORCO/VICODIN) 5-325 MG per tablet    Sig: Take 0.5-1 tablets by mouth 2 (two) times daily as needed (either 0.5 tab twice a day, or full tab once a day).    Dispense:  90 tablet    Refill:  0

## 2014-02-24 NOTE — Patient Instructions (Addendum)
Labs next visit a1c, cmet, cbc. Consider stool cards  (especially if anemia worsens) or ca 125 (if bloating persists)  90 days of pain medication. Can write 3 separate scripts if needed.   Glad you are back on bike. You wanted to hold off on a1c check.   Please keep an eye on blood pressure 3x a week, if regularly above 140/90 then please come see me to discuss potential adjustments.   We opted not for aspirin given your allergies. Consider plavix if ever have another event.   Health Maintenance Due  Topic Date Due  . MAMMOGRAM  10/09/2013  . OPHTHALMOLOGY EXAM  11/29/2013  . FOOT EXAM  12/26/2013

## 2014-02-25 NOTE — Assessment & Plan Note (Signed)
Last a1c 7.9. Advised a1c today but patient admits did not eat well over holidays. She wants to defer for 3 more months. Her CBGs actually seem reasonable in AM but with nighttime CBGs averaging around 180. Likely have room to increase insulin next visit. For now, continue Novolin 70/30 25 am and 23 units PM, metformin 1g BID, glipizide 10mg  BID. Consider stopping glipizide.

## 2014-02-25 NOTE — Assessment & Plan Note (Signed)
Reasonable control last 2 visits. Patient admits to increased pain today and may be elevated due to that. We discussed addiing medication today but ultimately patient requested to monitor at home and if elevated will follow up sooner than 3 months.

## 2014-02-25 NOTE — Assessment & Plan Note (Signed)
3 month rx for vicodin 1 pill per day-split or taken in whole but still only 1. Did not tolerate gabapentin and doubt would tolerate lyrica. Only other thought would be to try a 1/2 dose of 100mg  gabapentin at night to see if tolerates and can build up.

## 2014-03-02 ENCOUNTER — Telehealth: Payer: Self-pay
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
## 2014-03-03 NOTE — Telephone Encounter (Signed)
Please schedule pt for OV tomorrow 15 min slot

## 2014-03-03 NOTE — Telephone Encounter (Signed)
Yes she can come in at that time. Continue warm compresses and follow up if fevers or continues toincrease in size.

## 2014-03-03 NOTE — Telephone Encounter (Signed)
Please advise 

## 2014-03-03 NOTE — Telephone Encounter (Signed)
Pt scheduled  

## 2014-03-04 ENCOUNTER — Ambulatory Visit: Payer: BC Managed Care – PPO | Admitting: Family Medicine

## 2014-03-07 ENCOUNTER — Other Ambulatory Visit: Payer: Self-pay | Admitting: Internal Medicine

## 2014-03-29 ENCOUNTER — Other Ambulatory Visit: Payer: Self-pay | Admitting: Family Medicine

## 2014-04-08 ENCOUNTER — Other Ambulatory Visit: Payer: Self-pay | Admitting: Internal Medicine

## 2014-05-27 ENCOUNTER — Ambulatory Visit (INDEPENDENT_AMBULATORY_CARE_PROVIDER_SITE_OTHER): Payer: BC Managed Care – PPO | Admitting: Family Medicine

## 2014-05-27 ENCOUNTER — Encounter: Payer: Self-pay | Admitting: Family Medicine

## 2014-05-27 VITALS — BP 120/64 | HR 94 | Temp 98.0°F

## 2014-05-27 DIAGNOSIS — I639 Cerebral infarction, unspecified: Secondary | ICD-10-CM

## 2014-05-27 DIAGNOSIS — D649 Anemia, unspecified: Secondary | ICD-10-CM

## 2014-05-27 DIAGNOSIS — E1140 Type 2 diabetes mellitus with diabetic neuropathy, unspecified: Secondary | ICD-10-CM

## 2014-05-27 DIAGNOSIS — G894 Chronic pain syndrome: Secondary | ICD-10-CM

## 2014-05-27 DIAGNOSIS — I10 Essential (primary) hypertension: Secondary | ICD-10-CM | POA: Diagnosis not present

## 2014-05-27 DIAGNOSIS — E1149 Type 2 diabetes mellitus with other diabetic neurological complication: Secondary | ICD-10-CM

## 2014-05-27 LAB — COMPREHENSIVE METABOLIC PANEL
ALT: 28 U/L (ref 0–35)
AST: 32 U/L (ref 0–37)
Albumin: 3.9 g/dL (ref 3.5–5.2)
Alkaline Phosphatase: 74 U/L (ref 39–117)
BILIRUBIN TOTAL: 0.5 mg/dL (ref 0.2–1.2)
BUN: 15 mg/dL (ref 6–23)
CALCIUM: 9.4 mg/dL (ref 8.4–10.5)
CHLORIDE: 101 meq/L (ref 96–112)
CO2: 27 meq/L (ref 19–32)
Creatinine, Ser: 0.88 mg/dL (ref 0.40–1.20)
GFR: 68.01 mL/min (ref >60.00)
Glucose, Bld: 198 mg/dL — ABNORMAL HIGH (ref 70–99)
Potassium: 4 mEq/L (ref 3.5–5.1)
SODIUM: 134 meq/L — AB (ref 135–145)
TOTAL PROTEIN: 7.1 g/dL (ref 6.0–8.3)

## 2014-05-27 LAB — CBC
HEMATOCRIT: 35.4 % — AB (ref 36.0–46.0)
Hemoglobin: 11.6 g/dL — ABNORMAL LOW (ref 12.0–15.0)
MCHC: 32.7 g/dL (ref 30.0–36.0)
MCV: 82.7 fl (ref 78.0–100.0)
Platelets: 352 10*3/uL (ref 150.0–400.0)
RBC: 4.28 Mil/uL (ref 3.87–5.11)
RDW: 16.6 % — ABNORMAL HIGH (ref 11.5–15.5)
WBC: 7.7 10*3/uL (ref 4.0–10.5)

## 2014-05-27 LAB — HEMOGLOBIN A1C: Hgb A1c MFr Bld: 7.9 % — ABNORMAL HIGH (ref 4.6–6.5)

## 2014-05-27 MED ORDER — HYDROCODONE-ACETAMINOPHEN 5-325 MG PO TABS: 0.5000 | ORAL_TABLET | Freq: Two times a day (BID) | ORAL | Status: DC | PRN

## 2014-05-27 NOTE — Progress Notes (Signed)
Garret Reddish, MD Phone: [phone]  Subjective:   [name] is a 69 y.o. year old very pleasant female patient who presents with the following:  Diabetes mellitus II, mild poor control Lab Results  Component Value Date   HGBA1C 7.9* 05/27/2014  novolin 70/30 25 AM, 23 PM, met max, glip max. Considered stopping but States had  Very high #s off glipizide in past. Back on bike up to 20 minutes daily.  Home fastings in 150s-170s  Hypertension-good control today, poor control last visit  BP Readings from Last 3 Encounters:  05/27/14 120/64  02/24/14 158/74  12/04/13 138/70   Home BP monitoring-yes with vast majority #s <140/90 Compliant with medications-yes without side effects  Chronic pain syndrome-reasonable control on  vicodin 1 tab per day though at times takes 1.5 per day, discouraged against. Due to diabetic neuropathy and post herpetic neuralgia  ROS- no hypoglycemia, no blurry vision, known neuropathy. Denies any CP, HA, SOB, blurry vision.   Past Medical History- Patient Active Problem List   Diagnosis Date Noted  . Chronic pain syndrome 04/20/2009    Priority: High  . CVA (cerebral infarction) 01/05/2008    Priority: High  . Type 2 diabetes mellitus with neurological manifestations, controlled 11/01/2005    Priority: High  . Anemia 11/24/2013    Priority: Medium  . Abnormal transaminases 11/24/2013    Priority: Medium  . CKD (chronic kidney disease), stage III 01/11/2010    Priority: Medium  . Edema 05/13/2009    Priority: Medium  . Obesity 10/23/2007    Priority: Medium  . Depression 11/01/2005    Priority: Medium  . Essential hypertension 11/01/2005    Priority: Medium  . Rosacea 11/24/2013    Priority: Low  . Shingles 11/24/2013    Priority: Low  . OA (osteoarthritis) of knee 12/28/2012    Priority: Low  . Ventral hernia 01/17/2010    Priority: Low  . GERD 04/12/2009    Priority: Low   Medications- reviewed and updated Current Outpatient  Prescriptions  Medication Sig Dispense Refill  . BD INSULIN SYRINGE ULTRAFINE 31G X 5/16" 0.3 ML MISC USE AS DIRECTED TWICE DAILY. 100 each 5  . docusate sodium (COLACE) 50 MG capsule Take 100 mg by mouth daily.     Marland Kitchen FREESTYLE TEST STRIPS test strip CHECK BLOOD SUGAR 4 TIMES A DAY. 100 each 5  . furosemide (LASIX) 40 MG tablet TAKE 1 TABLET TWICE DAILY. 180 tablet 3  . GLIPIZIDE XL 10 MG 24 hr tablet TAKE 1 TABLET TWICE DAILY. 180 tablet 3  . Lancets (FREESTYLE) lancets USE AS DIRECTED 100 each 5  . metFORMIN (GLUCOPHAGE) 1000 MG tablet TAKE 1 TABLET TWICE DAILY WITH FOOD. 180 tablet 3  . metroNIDAZOLE (METROCREAM) 0.75 % cream APPLY TO AFFECTED AREA TWICE A DAY. 45 g 1  . NOVOLIN 70/30 (70-30) 100 UNIT/ML injection INJECT 23 UNITS SQ WITH BREAKFAST AND 23 UNITS WITH DINNER AND INCREASE AS DIRECTED. 10 mL 2  . potassium chloride SA (K-DUR,KLOR-CON) 20 MEQ tablet TAKE 1 TABLET TWICE DAILY. 180 tablet 3  . PRILOSEC OTC 20 MG tablet TAKE 1 TABLET ONCE DAILY. 30 tablet 1  . telmisartan (MICARDIS) 80 MG tablet TAKE 1 TABLET ONCE DAILY. 90 tablet 3  . HYDROcodone-acetaminophen (NORCO/VICODIN) 5-325 MG per tablet Take 0.5-1 tablets by mouth 2 (two) times daily as needed (either 0.5 tab twice a day, or full tab once a day). (Patient not taking: Reported on 05/27/2014) 90 tablet 0  . hydrocortisone (  ANUSOL-HC) 2.5 % rectal cream Place 1 application rectally 2 (two) times daily as needed for hemorrhoids or itching.    . methocarbamol (ROBAXIN) 500 MG tablet Take 1 tablet (500 mg total) by mouth every 6 (six) hours as needed for muscle spasms. (Patient not taking: Reported on 05/27/2014) 80 tablet 0  . omeprazole (PRILOSEC) 20 MG capsule Take 20 mg by mouth daily as needed (heartburn).     . valACYclovir (VALTREX) 500 MG tablet Take 1 tablet by mouth as needed (flair).     . [DISCONTINUED] potassium chloride (KLOR-CON) 20 MEQ packet Take 20 mEq by mouth 2 (two) times daily.       No current  facility-administered medications for this visit.    Objective: BP 120/64 mmHg  Pulse 94  Temp(Src) 98 F (36.7 C)  SpO2 95% Gen: NAD, resting comfortably, morbidly obese declines weight CV: RRR no murmurs rubs or gallops Lungs: CTAB no crackles, wheeze, rhonchi Abdomen: soft/nontender/nondistended/normal bowel sounds. No rebound or guarding.  Ext: 1+ pitting edema Skin: warm, dry, no rash Neuro:  Diabetic Foot Exam - Simple   Simple Foot Form  Diabetic Foot exam was performed with the following findings:  Yes 05/27/2014 12:08 PM  Visual Inspection  No deformities, no ulcerations, no other skin breakdown bilaterally:  Yes  Sensation Testing  See comments:  Yes  Pulse Check  Posterior Tibialis and Dorsalis pulse intact bilaterally:  Yes  Comments  No sensation monofilament until mid shin      Assessment/Plan:  Type 2 diabetes mellitus with neurological manifestations, controlled a1c remains at 7.9. Increase Novolin 70/30 25 am and 25 units PM (from 25 AM, 23 PM), metformin 1g BID, glipizide 58m BID. Fasting CBG still 150s to 170s so nighttime needs increase. Follow up in 3 months since a1c <8.    Chronic pain syndrome Continue Vicodin 1 tab per day (1/2 tab BID), no increase. Patient requests increase and I reviewed risks and we made a joint decision to decline increase.    Essential hypertension Controlled on Micardis 864m lasix 4043mContinue, relieved as last BP elevated     Results for orders placed or performed in visit on 05/27/14 (from the past 24 hour(s))  Hemoglobin A1c     Status: Abnormal   Collection Time: 05/27/14 12:14 PM  Result Value Ref Range   Hgb A1c MFr Bld 7.9 (H) 4.6 - 6.5 %  CBC (no diff)     Status: Abnormal   Collection Time: 05/27/14 12:14 PM  Result Value Ref Range   WBC 7.7 4.0 - 10.5 K/uL   RBC 4.28 3.87 - 5.11 Mil/uL   Platelets 352.0 150.0 - 400.0 K/uL   Hemoglobin 11.6 (L) 12.0 - 15.0 g/dL   HCT 35.4 (L) 36.0 - 46.0 %   MCV 82.7  78.0 - 100.0 fl   MCHC 32.7 30.0 - 36.0 g/dL   RDW 16.6 (H) 11.5 - 15.5 %  Comprehensive metabolic panel     Status: Abnormal   Collection Time: 05/27/14 12:14 PM  Result Value Ref Range   Sodium 134 (L) 135 - 145 mEq/L   Potassium 4.0 3.5 - 5.1 mEq/L   Chloride 101 96 - 112 mEq/L   CO2 27 19 - 32 mEq/L   Glucose, Bld 198 (H) 70 - 99 mg/dL   BUN 15 6 - 23 mg/dL   Creatinine, Ser 0.88 0.40 - 1.20 mg/dL   Total Bilirubin 0.5 0.2 - 1.2 mg/dL   Alkaline Phosphatase 74 39 -  117 U/L   AST 32 0 - 37 U/L   ALT 28 0 - 35 U/L   Total Protein 7.1 6.0 - 8.3 g/dL   Albumin 3.9 3.5 - 5.2 g/dL   Calcium 9.4 8.4 - 10.5 mg/dL   GFR 68.01 >60.00 mL/min     Meds ordered this encounter  Medications  . HYDROcodone-acetaminophen (NORCO/VICODIN) 5-325 MG per tablet    Sig: Take 0.5-1 tablets by mouth 2 (two) times daily as needed (either 0.5 tab twice a day, or full tab once a day).    Dispense:  90 tablet    Refill:  0

## 2014-05-27 NOTE — Assessment & Plan Note (Signed)
Controlled on Micardis 80mg , lasix 40mg . Continue, relieved as last BP elevated

## 2014-05-27 NOTE — Assessment & Plan Note (Signed)
Continue Vicodin 1 tab per day (1/2 tab BID), no increase. Patient requests increase and I reviewed risks and we made a joint decision to decline increase.

## 2014-05-27 NOTE — Patient Instructions (Addendum)
Get mammogram scheduled and eye exam and have them fax [place] the report at [phone].   Let's increase your insulin to 25 units twice a day for now. Goal morning sugars between 100-120. If a1c <8, will see each other in 3 months, if >8, see me in about 3 weeks. Check your blood sguar as highlighted in log at least twice a day.   Refilled pain medicine  BP looks great

## 2014-05-27 NOTE — Assessment & Plan Note (Signed)
a1c remains at 7.9. Increase Novolin 70/30 25 am and 25 units PM (from 25 AM, 23 PM), metformin 1g BID, glipizide 10mg  BID. Fasting CBG still 150s to 170s so nighttime needs increase. Follow up in 3 months since a1c <8.

## 2014-05-29 ENCOUNTER — Other Ambulatory Visit: Payer: Self-pay | Admitting: Family Medicine

## 2014-06-08 ENCOUNTER — Other Ambulatory Visit: Payer: Self-pay | Admitting: Family Medicine

## 2014-06-25 LAB — HM DEXA SCAN

## 2014-06-25 LAB — HM MAMMOGRAPHY: HM Mammogram: NORMAL

## 2014-06-30 ENCOUNTER — Encounter: Payer: Self-pay | Admitting: Family Medicine

## 2014-07-05 ENCOUNTER — Encounter: Payer: Self-pay | Admitting: Family Medicine

## 2014-07-05 DIAGNOSIS — M8580 Other specified disorders of bone density and structure, unspecified site: Secondary | ICD-10-CM | POA: Insufficient documentation

## 2014-07-10 ENCOUNTER — Other Ambulatory Visit: Payer: Self-pay | Admitting: Internal Medicine

## 2014-07-30 ENCOUNTER — Other Ambulatory Visit: Payer: Self-pay | Admitting: Internal Medicine

## 2014-08-20 ENCOUNTER — Other Ambulatory Visit: Payer: Self-pay | Admitting: Internal Medicine

## 2014-08-27 ENCOUNTER — Encounter: Payer: Self-pay | Admitting: Family Medicine

## 2014-08-27 ENCOUNTER — Ambulatory Visit (INDEPENDENT_AMBULATORY_CARE_PROVIDER_SITE_OTHER): Payer: BC Managed Care – PPO | Admitting: Family Medicine

## 2014-08-27 VITALS — BP 138/74 | HR 87 | Temp 98.5°F | Wt 275.0 lb

## 2014-08-27 DIAGNOSIS — E1140 Type 2 diabetes mellitus with diabetic neuropathy, unspecified: Secondary | ICD-10-CM

## 2014-08-27 DIAGNOSIS — E1149 Type 2 diabetes mellitus with other diabetic neurological complication: Secondary | ICD-10-CM

## 2014-08-27 DIAGNOSIS — I10 Essential (primary) hypertension: Secondary | ICD-10-CM

## 2014-08-27 DIAGNOSIS — G894 Chronic pain syndrome: Secondary | ICD-10-CM

## 2014-08-27 DIAGNOSIS — M8580 Other specified disorders of bone density and structure, unspecified site: Secondary | ICD-10-CM | POA: Diagnosis not present

## 2014-08-27 LAB — BASIC METABOLIC PANEL
BUN: 13 mg/dL (ref 6–23)
CO2: 27 mEq/L (ref 19–32)
Calcium: 9.3 mg/dL (ref 8.4–10.5)
Chloride: 101 mEq/L (ref 96–112)
Creatinine, Ser: 0.94 mg/dL (ref 0.40–1.20)
GFR: 62.98 mL/min (ref >60.00)
GLUCOSE: 199 mg/dL — AB (ref 70–99)
Potassium: 4.1 mEq/L (ref 3.5–5.1)
Sodium: 137 mEq/L (ref 135–145)

## 2014-08-27 LAB — HEMOGLOBIN A1C: HEMOGLOBIN A1C: 7.6 % — AB (ref 4.6–6.5)

## 2014-08-27 MED ORDER — HYDROCODONE-ACETAMINOPHEN 5-325 MG PO TABS: 0.5000 | ORAL_TABLET | Freq: Two times a day (BID) | ORAL | Status: DC | PRN

## 2014-08-27 MED ORDER — INSULIN NPH ISOPHANE & REGULAR (70-30) 100 UNIT/ML ~~LOC~~ SUSP: 26.0000 [IU] | Freq: Two times a day (BID) | SUBCUTANEOUS | Status: DC

## 2014-08-27 NOTE — Assessment & Plan Note (Signed)
GFR stable above 60 last 2 checks. Continue to monitor

## 2014-08-27 NOTE — Progress Notes (Signed)
Garret Reddish, MD  Subjective:  [name] is a 69 y.o. year old very pleasant female patient who presents with:  See problem oriented charting ROS- denies hypoglycemia, blurry vision  Past Medical History- history CVA, obesity and weight gain despite gastric bypass,   Medications- reviewed and updated Current Outpatient Prescriptions  Medication Sig Dispense Refill  . BD INSULIN SYRINGE ULTRAFINE 31G X 5/16" 0.3 ML MISC USE AS DIRECTED TWICE DAILY. 100 each 5  . docusate sodium (COLACE) 50 MG capsule Take 100 mg by mouth daily.     . furosemide (LASIX) 40 MG tablet TAKE 1 TABLET TWICE DAILY. 180 tablet 3  . glipiZIDE (GLUCOTROL XL) 10 MG 24 hr tablet TAKE 1 TABLET TWICE DAILY. 180 tablet 6  . glucose blood (FREESTYLE LITE) test strip Check blood sugars 4 times daily. Dx:E11.9 100 each 5  . Lancets (FREESTYLE) lancets Test blood sugars 4 times daily. Dx: E11.9 100 each 5  . metFORMIN (GLUCOPHAGE) 1000 MG tablet TAKE 1 TABLET TWICE DAILY WITH FOOD. 180 tablet 1  . methocarbamol (ROBAXIN) 500 MG tablet Take 1 tablet (500 mg total) by mouth every 6 (six) hours as needed for muscle spasms. 80 tablet 0  . metroNIDAZOLE (METROCREAM) 0.75 % cream APPLY TO AFFECTED AREA TWICE A DAY. 45 g 1  . NOVOLIN 70/30 (70-30) 100 UNIT/ML injection INJECT 23 UNITS SQ WITH BREAKFAST AND 23 UNITS WITH DINNER AND INCREASE AS DIRECTED. 10 mL 5  . omeprazole (PRILOSEC) 20 MG capsule Take 20 mg by mouth daily as needed (heartburn).     . potassium chloride SA (K-DUR,KLOR-CON) 20 MEQ tablet TAKE 1 TABLET TWICE DAILY. 180 tablet 3  . PRILOSEC OTC 20 MG tablet TAKE 1 TABLET ONCE DAILY. 30 tablet 1  . telmisartan (MICARDIS) 80 MG tablet TAKE 1 TABLET ONCE DAILY. 90 tablet 0  . HYDROcodone-acetaminophen (NORCO/VICODIN) 5-325 MG per tablet Take 0.5-1 tablets by mouth 2 (two) times daily as needed (either 0.5 tab twice a day, or full tab once a day). (Patient not taking: Reported on 08/27/2014) 90 tablet 0  .  hydrocortisone (ANUSOL-HC) 2.5 % rectal cream Place 1 application rectally 2 (two) times daily as needed for hemorrhoids or itching.    . valACYclovir (VALTREX) 500 MG tablet Take 1 tablet by mouth as needed (flair).     . [DISCONTINUED] potassium chloride (KLOR-CON) 20 MEQ packet Take 20 mEq by mouth 2 (two) times daily.       Objective: BP 138/74 mmHg  Pulse 87  Temp(Src) 98.5 F (36.9 C)  Wt 275 lb (124.739 kg) Gen: NAD, resting comfortably CV: RRR no murmurs rubs or gallops Lungs: CTAB no crackles, wheeze, rhonchi Abdomen: soft/nontender/nondistended/normal bowel sounds. No rebound or guarding. Morbidly obese Ext: trace edema (obese calves) and very tender to touch per baseline Skin: warm, dry Neuro: grossly normal, moves all extremities, walks with cane   Assessment/Plan:  Type 2 diabetes mellitus with neurological manifestations, controlled S: patient taking novlin 70/30 26 units AM and PM, maximum glipizide and metformin as well. Fasting CBGS low 100s but all other checks above 200.  A/P:a1c improved to 7.6. Increase Novolin 70/30 28 am and 26 units PM (from 26 AM, 26 PM). continue metformin 1g BID, glipizide 10mg  BID.     Chronic pain syndrome S: diabetic neuropathy in legs and post herpetic neuralgia as cause. Taking no more than 1 pill total vicodin a day. Asks about tramadol A/P: discussed sticking with 1 narcotic to avoid increasing overall dosage. Offered  tramadol 50mg  TID prn but patient has done better with vicodin 1/2 pill prn. Refilled #90.    Osteopenia S:dexa -1.3 right femoral neck on 06/25/14 on screening.  A/P: continue vitamin D 1000 IU. We discussed calcium and patient concerned of cardiac risks so will remain off. Repeat DEXA 3-5 years for any progression. Under treatment thresholds currently.   Essential hypertension S: controlled on Micardis 80mg , lasix 40mg  A/P: continue current rx. Check BMET- GFR is just above 60.    CKD (chronic kidney disease),  stage III GFR stable above 60 last 2 checks. Continue to monitor  3 month f/u.   Results for orders placed or performed in visit on 08/27/14 (from the past 24 hour(s))  Hemoglobin A1c     Status: Abnormal   Collection Time: 08/27/14  3:17 PM  Result Value Ref Range   Hgb A1c MFr Bld 7.6 (H) 4.6 - 6.5 %  Basic metabolic panel     Status: Abnormal   Collection Time: 08/27/14  3:17 PM  Result Value Ref Range   Sodium 137 135 - 145 mEq/L   Potassium 4.1 3.5 - 5.1 mEq/L   Chloride 101 96 - 112 mEq/L   CO2 27 19 - 32 mEq/L   Glucose, Bld 199 (H) 70 - 99 mg/dL   BUN 13 6 - 23 mg/dL   Creatinine, Ser 0.94 0.40 - 1.20 mg/dL   Calcium 9.3 8.4 - 10.5 mg/dL   GFR 62.98 >60.00 mL/min   Meds ordered this encounter  Medications  . HYDROcodone-acetaminophen (NORCO/VICODIN) 5-325 MG per tablet    Sig: Take 0.5-1 tablets by mouth 2 (two) times daily as needed (either 0.5 tab twice a day, or full tab once a day).    Dispense:  90 tablet    Refill:  0  . insulin NPH-regular Human (NOVOLIN 70/30) (70-30) 100 UNIT/ML injection    Sig: Inject 26-30 Units into the skin 2 (two) times daily with a meal.    Dispense:  20 mL    Refill:  5

## 2014-08-27 NOTE — Patient Instructions (Addendum)
Have eye exam faxed to [place] at [phone].  Let's repeat bone density in 3-5 years, mild low bone density. Continue vitamin D.   Chronic pain from neuropathy -refilled hydrocodone  Diabetes- let's see what the a1c is before making adjustments. Glad no low blood sugars, may have room to increase dose. New rx sent in for 26-30 units twice a day

## 2014-08-27 NOTE — Assessment & Plan Note (Signed)
S:dexa -1.3 right femoral neck on 06/25/14 on screening.  A/P: continue vitamin D 1000 IU. We discussed calcium and patient concerned of cardiac risks so will remain off. Repeat DEXA 3-5 years for any progression. Under treatment thresholds currently.

## 2014-08-27 NOTE — Assessment & Plan Note (Signed)
S: controlled on Micardis 80mg , lasix 40mg  A/P: continue current rx. Check BMET- GFR is just above 60.

## 2014-08-27 NOTE — Assessment & Plan Note (Signed)
S: diabetic neuropathy in legs and post herpetic neuralgia as cause. Taking no more than 1 pill total vicodin a day. Asks about tramadol A/P: discussed sticking with 1 narcotic to avoid increasing overall dosage. Offered tramadol 50mg  TID prn but patient has done better with vicodin 1/2 pill prn. Refilled #90.

## 2014-08-27 NOTE — Assessment & Plan Note (Signed)
S: patient taking novlin 70/30 26 units AM and PM, maximum glipizide and metformin as well. Fasting CBGS low 100s but all other checks above 200.  A/P:a1c improved to 7.6. Increase Novolin 70/30 28 am and 26 units PM (from 26 AM, 26 PM). continue metformin 1g BID, glipizide 10mg  BID.

## 2014-08-31 ENCOUNTER — Telehealth: Payer: Self-pay | Admitting: Family Medicine

## 2014-08-31 NOTE — Telephone Encounter (Signed)
Pt states she is having bad back spasms, worse at night, and would like a refill of methocarbamol (ROBAXIN) 500 mg   Or if dr hunter thinks flexeril would be better, pt had that prescribed by dr swords.  [facility]  Pt was just seen Friday.

## 2014-08-31 NOTE — Telephone Encounter (Signed)
You can refill the robaxin

## 2014-08-31 NOTE — Telephone Encounter (Signed)
See below

## 2014-09-01 MED ORDER — METHOCARBAMOL 500 MG PO TABS: 500.0000 mg | ORAL_TABLET | Freq: Four times a day (QID) | ORAL | Status: DC | PRN

## 2014-09-01 NOTE — Telephone Encounter (Signed)
Medication refilled

## 2014-10-11 ENCOUNTER — Ambulatory Visit (INDEPENDENT_AMBULATORY_CARE_PROVIDER_SITE_OTHER): Payer: BC Managed Care – PPO | Admitting: Family Medicine

## 2014-10-11 ENCOUNTER — Encounter: Payer: Self-pay | Admitting: Family Medicine

## 2014-10-11 VITALS — BP 144/72 | HR 97 | Temp 98.3°F | Wt 275.0 lb

## 2014-10-11 DIAGNOSIS — M25532 Pain in left wrist: Secondary | ICD-10-CM | POA: Diagnosis not present

## 2014-10-11 LAB — BASIC METABOLIC PANEL
BUN: 15 mg/dL (ref 6–23)
CALCIUM: 9 mg/dL (ref 8.4–10.5)
CO2: 27 mEq/L (ref 19–32)
CREATININE: 0.94 mg/dL (ref 0.40–1.20)
Chloride: 101 mEq/L (ref 96–112)
GFR: 62.96 mL/min (ref >60.00)
Glucose, Bld: 218 mg/dL — ABNORMAL HIGH (ref 70–99)
Potassium: 4.2 mEq/L (ref 3.5–5.1)
SODIUM: 135 meq/L (ref 135–145)

## 2014-10-11 LAB — CBC WITH DIFFERENTIAL/PLATELET
Basophils Absolute: 0 10*3/uL (ref 0.0–0.1)
Basophils Relative: 0.3 % (ref 0.0–3.0)
Eosinophils Absolute: 0.2 10*3/uL (ref 0.0–0.7)
Eosinophils Relative: 1.8 % (ref 0.0–5.0)
HCT: 35.1 % — ABNORMAL LOW (ref 36.0–46.0)
Hemoglobin: 11.5 g/dL — ABNORMAL LOW (ref 12.0–15.0)
LYMPHS ABS: 2.4 10*3/uL (ref 0.7–4.0)
Lymphocytes Relative: 25.8 % (ref 12.0–46.0)
MCHC: 32.8 g/dL (ref 30.0–36.0)
MCV: 83.2 fl (ref 78.0–100.0)
MONO ABS: 0.7 10*3/uL (ref 0.1–1.0)
MONOS PCT: 7.6 % (ref 3.0–12.0)
NEUTROS ABS: 6.1 10*3/uL (ref 1.4–7.7)
NEUTROS PCT: 64.5 % (ref 43.0–77.0)
PLATELETS: 354 10*3/uL (ref 150.0–400.0)
RBC: 4.22 Mil/uL (ref 3.87–5.11)
RDW: 16.7 % — AB (ref 11.5–15.5)
WBC: 9.4 10*3/uL (ref 4.0–10.5)

## 2014-10-11 NOTE — Patient Instructions (Signed)
I am worried about your wrist and your level of pain. I want to get you into [facility] orthopedics tomorrow. If you do not hear back from [place] by tomorrow morning, give [place] a call. I am worried about an infection in your joint

## 2014-10-11 NOTE — Progress Notes (Signed)
Paula Reddish, MD  Subjective:  [name] is a 69 y.o. year old very pleasant female patient who presents with:  Left wrist pain -Patient had flu like symptoms for 4 days last week- temperature into low 100s, fatigue, body aches. During this time developed L shoulder, left elbow, left wrist pain. While her temperature, fatigue, body aches have improved as well as L shoulder and left elbow (at least 50% better), her left wrist pain has persisted and become most prominent. Describes severe ache in the wrist, worse if she flexes the wrist. She has had a hard time maneuvering since she walks with assist of brace with right arm. She has noted swelling and some mild warmth to the wrist. She denies falls or injury. Has used ice, heat which have not helped much. Has immobilized her wrist which helps minorly.   ROS- no nausea, vomiting, no expanding redness on skin. No chst pain or shortness of breath.   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 BD INSULIN SYRINGE ULTRAFINE 31G X 5/16" 0.3 ML MISC USE AS DIRECTED TWICE DAILY. 100 each 5  . furosemide (LASIX) 40 MG tablet TAKE 1 TABLET TWICE DAILY. 180 tablet 3  . glipiZIDE (GLUCOTROL XL) 10 MG 24 hr tablet TAKE 1 TABLET TWICE DAILY. 180 tablet 6  . glucose blood (FREESTYLE LITE) test strip Check blood sugars 4 times daily. Dx:E11.9 100 each 5  . insulin NPH-regular Human (NOVOLIN 70/30) (70-30) 100 UNIT/ML injection Inject 26-30 Units into the skin 2 (two) times daily with a meal. 20 mL 5  . Lancets (FREESTYLE) lancets Test blood sugars 4 times daily. Dx: E11.9 100 each 5  . metFORMIN (GLUCOPHAGE) 1000 MG tablet TAKE 1 TABLET TWICE DAILY WITH FOOD. 180 tablet 1  .  metroNIDAZOLE (METROCREAM) 0.75 % cream APPLY TO AFFECTED AREA TWICE A DAY. 45 g 1  . omeprazole (PRILOSEC) 20 MG capsule Take 20 mg by mouth daily as needed (heartburn).     . potassium chloride SA (K-DUR,KLOR-CON) 20 MEQ tablet TAKE 1 TABLET TWICE DAILY. 180 tablet 3  . PRILOSEC OTC 20 MG tablet TAKE 1 TABLET ONCE DAILY. 30 tablet 1  . telmisartan (MICARDIS) 80 MG tablet TAKE 1 TABLET ONCE DAILY. 90 tablet 0  . valACYclovir (VALTREX) 500 MG tablet Take 1 tablet by mouth as needed (flair).     Marland Kitchen docusate sodium (COLACE) 50 MG capsule Take 100 mg by mouth daily.     Marland Kitchen HYDROcodone-acetaminophen (NORCO/VICODIN) 5-325 MG per tablet Take 0.5-1 tablets by mouth 2 (two) times daily as needed (either 0.5 tab twice a day, or full tab once a day). (Patient not taking: Reported on 10/11/2014) 90 tablet 0  . hydrocortisone (ANUSOL-HC) 2.5 % rectal cream Place 1 application rectally 2 (two) times daily as needed for hemorrhoids or itching.    . methocarbamol (ROBAXIN) 500 MG tablet Take 1 tablet (500 mg total) by mouth every 6 (six) hours as needed for muscle spasms. (Patient not taking: Reported on 10/11/2014) 80 tablet 0  . [DISCONTINUED] potassium chloride (KLOR-CON) 20 MEQ packet Take 20 mEq by mouth 2 (two) times daily.       Objective: BP 144/72 mmHg  Pulse 97  Temp(Src) 98.3 F (36.8 C)  Wt 275 lb (124.739 kg) Gen: NAD, resting comfortably CV: RRR no murmurs rubs or gallops Lungs: CTAB no crackles, wheeze, rhonchi Abdomen: morbidly obese  Ext: no edema Swollen with mild erythema and extremely tender on dorsal side of wrist. Very tender along joint line. With flexion of wrist, severe pain.   Abducts shoulder to 90 degrees with minor pain, beyond this experiences pain Skin: warm, dry, no rash  Assessment/Plan:  Left wrist pain Without injury or trauma and with recent flu like symptoms as well as swelling and mild warmth and severe pain, I am most concerned about potential septic arthritis though  given the polyarthralgia she had I think this is less likely, also given flu like symptoms improving makes it less likely. [place] [facility] unable to see patient until Friday. Dr. [name] of [place] [facility] has agreed to see patient tomorrow to evaluate further. Potentially with x-ray or ultrasound or exam/history and his expertise joint aspiration can be avoided- but this is the primary issue I am asking for his opinion on. If he perceives non-acute issue like septic arthritis, I am happy to facilitate later follow up with [facility] but really appreciate his help in quicker evaluation. Check CBC with diff and BMET with plan to forward to his office when available.    Orders Placed This Encounter  Procedures  . CBC with Differential/Platelet  . Basic metabolic panel    [place]  . Ambulatory referral to Orthopedic Surgery    Referral Priority:  Urgent    Referral Type:  Surgical    Referral Reason:  Specialty Services Required    Requested Specialty:  Orthopedic Surgery    Number of Visits Requested:  1

## 2014-10-28 ENCOUNTER — Other Ambulatory Visit: Payer: Self-pay | Admitting: Family Medicine

## 2014-11-09 ENCOUNTER — Ambulatory Visit: Payer: BC Managed Care – PPO | Admitting: Family Medicine

## 2014-11-29 ENCOUNTER — Ambulatory Visit (INDEPENDENT_AMBULATORY_CARE_PROVIDER_SITE_OTHER): Payer: BC Managed Care – PPO | Admitting: Family Medicine

## 2014-11-29 VITALS — BP 130/76 | HR 95 | Temp 98.4°F | Wt 275.0 lb

## 2014-11-29 DIAGNOSIS — E1149 Type 2 diabetes mellitus with other diabetic neurological complication: Secondary | ICD-10-CM

## 2014-11-29 DIAGNOSIS — M25532 Pain in left wrist: Secondary | ICD-10-CM

## 2014-11-29 DIAGNOSIS — G894 Chronic pain syndrome: Secondary | ICD-10-CM | POA: Diagnosis not present

## 2014-11-29 DIAGNOSIS — Z23 Encounter for immunization: Secondary | ICD-10-CM

## 2014-11-29 DIAGNOSIS — I10 Essential (primary) hypertension: Secondary | ICD-10-CM

## 2014-11-29 LAB — URIC ACID: Uric Acid, Serum: 8.5 mg/dL — ABNORMAL HIGH (ref 2.4–7.0)

## 2014-11-29 LAB — COMPREHENSIVE METABOLIC PANEL
ALT: 33 U/L (ref 0–35)
AST: 36 U/L (ref 0–37)
Albumin: 3.7 g/dL (ref 3.5–5.2)
Alkaline Phosphatase: 78 U/L (ref 39–117)
BILIRUBIN TOTAL: 0.6 mg/dL (ref 0.2–1.2)
BUN: 15 mg/dL (ref 6–23)
CALCIUM: 9.7 mg/dL (ref 8.4–10.5)
CO2: 24 meq/L (ref 19–32)
CREATININE: 0.86 mg/dL (ref 0.40–1.20)
Chloride: 101 mEq/L (ref 96–112)
GFR: 69.74 mL/min (ref >60.00)
GLUCOSE: 151 mg/dL — AB (ref 70–99)
Potassium: 4.3 mEq/L (ref 3.5–5.1)
Sodium: 137 mEq/L (ref 135–145)
TOTAL PROTEIN: 7.4 g/dL (ref 6.0–8.3)

## 2014-11-29 LAB — HEMOGLOBIN A1C: HEMOGLOBIN A1C: 8.3 % — AB (ref 4.6–6.5)

## 2014-11-29 LAB — LDL CHOLESTEROL, DIRECT: Direct LDL: 42 mg/dL

## 2014-11-29 MED ORDER — HYDROCODONE-ACETAMINOPHEN 5-325 MG PO TABS: 0.5000 | ORAL_TABLET | Freq: Two times a day (BID) | ORAL | Status: DC | PRN

## 2014-11-29 NOTE — Progress Notes (Signed)
Garret Reddish, MD  Subjective:  [name] is a 69 y.o. year old very pleasant female patient who presents for/with See problem oriented charting ROS- No chest pain or shortness of breath. No headache or blurry vision.   Past Medical History-  Patient Active Problem List   Diagnosis Date Noted  . Chronic pain syndrome 04/20/2009    Priority: High  . CVA (cerebral infarction) 01/05/2008    Priority: High  . Type 2 diabetes mellitus with neurological manifestations, controlled ([place]) 11/01/2005    Priority: High  . Osteopenia 07/05/2014    Priority: Medium  . Anemia 11/24/2013    Priority: Medium  . Abnormal transaminases 11/24/2013    Priority: Medium  . CKD (chronic kidney disease), stage III 01/11/2010    Priority: Medium  . Edema 05/13/2009    Priority: Medium  . Obesity 10/23/2007    Priority: Medium  . Depression 11/01/2005    Priority: Medium  . Essential hypertension 11/01/2005    Priority: Medium  . Rosacea 11/24/2013    Priority: Low  . Shingles 11/24/2013    Priority: Low  . OA (osteoarthritis) of knee 12/28/2012    Priority: Low  . Ventral hernia 01/17/2010    Priority: Low  . GERD 04/12/2009    Priority: Low    Medications- reviewed and updated Current Outpatient Prescriptions  Medication Sig Dispense Refill  . BD INSULIN SYRINGE ULTRAFINE 31G X 5/16" 0.3 ML MISC USE AS DIRECTED TWICE DAILY. 100 each 5  . docusate sodium (COLACE) 50 MG capsule Take 100 mg by mouth daily.     . furosemide (LASIX) 40 MG tablet TAKE 1 TABLET TWICE DAILY. 180 tablet 3  . glipiZIDE (GLUCOTROL XL) 10 MG 24 hr tablet TAKE 1 TABLET TWICE DAILY. 180 tablet 6  . glucose blood (FREESTYLE LITE) test strip Check blood sugars 4 times daily. Dx:E11.9 100 each 5  . HYDROcodone-acetaminophen (NORCO/VICODIN) 5-325 MG tablet Take 0.5-1 tablets by mouth 2 (two) times daily as needed (either 0.5 tab twice a day, or full tab once a day). 90 tablet 0  . hydrocortisone (ANUSOL-HC) 2.5 %  rectal cream Place 1 application rectally 2 (two) times daily as needed for hemorrhoids or itching.    . insulin NPH-regular Human (NOVOLIN 70/30) (70-30) 100 UNIT/ML injection Inject 26-30 Units into the skin 2 (two) times daily with a meal. 20 mL 5  . Lancets (FREESTYLE) lancets Test blood sugars 4 times daily. Dx: E11.9 100 each 5  . metFORMIN (GLUCOPHAGE) 1000 MG tablet TAKE 1 TABLET TWICE DAILY WITH FOOD. 180 tablet 1  . methocarbamol (ROBAXIN) 500 MG tablet Take 1 tablet (500 mg total) by mouth every 6 (six) hours as needed for muscle spasms. (Patient not taking: Reported on 10/11/2014) 80 tablet 0  . metroNIDAZOLE (METROCREAM) 0.75 % cream APPLY TO AFFECTED AREA TWICE A DAY. 45 g 1  . omeprazole (PRILOSEC) 20 MG capsule Take 20 mg by mouth daily as needed (heartburn).     . potassium chloride SA (K-DUR,KLOR-CON) 20 MEQ tablet TAKE 1 TABLET TWICE DAILY. 180 tablet 3  . PRILOSEC OTC 20 MG tablet TAKE 1 TABLET ONCE DAILY. 30 tablet 1  . telmisartan (MICARDIS) 80 MG tablet TAKE 1 TABLET ONCE DAILY. 90 tablet 3  . valACYclovir (VALTREX) 500 MG tablet Take 1 tablet by mouth as needed (flair).     . [DISCONTINUED] potassium chloride (KLOR-CON) 20 MEQ packet Take 20 mEq by mouth 2 (two) times daily.  No current facility-administered medications for this visit.    Objective: BP 130/76 mmHg  Pulse 95  Temp(Src) 98.4 F (36.9 C)  Wt 275 lb (124.739 kg) Gen: NAD, resting comfortably CV: RRR no murmurs rubs or gallops Lungs: CTAB no crackles, wheeze, rhonchi Abdomen: soft/nontender/nondistended/normal bowel sounds. No rebound or guarding. , morbid obesity Ext: trace edema Skin: warm, dry Neuro: grossly normal, moves all extremities, walks with Cane on right arm, appears to be uncomfortable walking  Assessment/Plan:  Type 2 diabetes mellitus with neurological manifestations, controlled (HCC) S: compliant with Novolin 70/30 28 am and 26 units PM, metformin 1g BID, glipizide 10mg  BID.  Patient admits she has been eating poorly. Her CBGs have been higher than last reported low 100s. She previosuly had other checks above 200 and that persists. wosrened control from a1c of 7.6 Lab Results  Component Value Date   HGBA1C 8.3* 11/29/2014  A/P: patient to be called and advised bring log of sugars and be seen within a month for titration of insulin. On other hand, she knows her dietary habits were poor so perhaps this could be reversed with increasing activity (planning on water exercises potentially) and improving diet again.    Essential hypertension S: controlled. On  Micardis 80mg , lasix 40mg  BP Readings from Last 3 Encounters:  11/29/14 130/76  10/11/14 144/72  08/27/14 138/74  A/P:Continue current meds: no change as controlled   Chronic pain syndrome S: continues to have poor control at times but is using Vicodin 1 tab per day (1/2 tab BID)- lasted 92 days at this point. She tries to use it as sparingly as possible. She would prefer other options but had SE with other neuropatic agents such as gabapentin. Once again pain from post herpetic neuralgia and diabetic neuropathy A/P: refilled vicodin x 90 days. Once again discussed this would not be increased.    Elevated uric acid with prior left wrist pain Suspect gout with uric acid 8.5. Encouraged follow up within a month to talk about allopurinol as well as adjusting DM medicines  Return precautions advised.   Orders Placed This Encounter  Procedures  . Flu Vaccine QUAD 36+ mos IM  . Hemoglobin A1c    [place]  . Comprehensive metabolic panel    [place]  . Uric Acid  . LDL cholesterol, direct    [place]    Meds ordered this encounter  Medications  . HYDROcodone-acetaminophen (NORCO/VICODIN) 5-325 MG tablet    Sig: Take 0.5-1 tablets by mouth 2 (two) times daily as needed (either 0.5 tab twice a day, or full tab once a day).    Dispense:  90 tablet    Refill:  0

## 2014-11-29 NOTE — Patient Instructions (Addendum)
Flu shot received today.  Refilled vicodin for 90 days  Check labs before you leave

## 2014-11-30 NOTE — Assessment & Plan Note (Signed)
S: continues to have poor control at times but is using Vicodin 1 tab per day (1/2 tab BID)- lasted 92 days at this point. She tries to use it as sparingly as possible. She would prefer other options but had SE with other neuropatic agents such as gabapentin. Once again pain from post herpetic neuralgia and diabetic neuropathy A/P: refilled vicodin x 90 days. Once again discussed this would not be increased.

## 2014-11-30 NOTE — Assessment & Plan Note (Signed)
S: controlled. On  Micardis 80mg , lasix 40mg  BP Readings from Last 3 Encounters:  11/29/14 130/76  10/11/14 144/72  08/27/14 138/74  A/P:Continue current meds: no change as controlled

## 2014-11-30 NOTE — Assessment & Plan Note (Signed)
S: compliant with Novolin 70/30 28 am and 26 units PM, metformin 1g BID, glipizide 10mg  BID. Patient admits she has been eating poorly. Her CBGs have been higher than last reported low 100s. She previosuly had other checks above 200 and that persists. wosrened control from a1c of 7.6 Lab Results  Component Value Date   HGBA1C 8.3* 11/29/2014  A/P: patient to be called and advised bring log of sugars and be seen within a month for titration of insulin. On other hand, she knows her dietary habits were poor so perhaps this could be reversed with increasing activity (planning on water exercises potentially) and improving diet again.

## 2015-01-04 ENCOUNTER — Ambulatory Visit (INDEPENDENT_AMBULATORY_CARE_PROVIDER_SITE_OTHER): Payer: BC Managed Care – PPO | Admitting: Family Medicine

## 2015-01-04 ENCOUNTER — Encounter: Payer: Self-pay | Admitting: Family Medicine

## 2015-01-04 VITALS — BP 148/70 | Temp 98.1°F | Wt 270.0 lb

## 2015-01-04 DIAGNOSIS — M1A09X0 Idiopathic chronic gout, multiple sites, without tophus (tophi): Secondary | ICD-10-CM

## 2015-01-04 DIAGNOSIS — E1149 Type 2 diabetes mellitus with other diabetic neurological complication: Secondary | ICD-10-CM | POA: Diagnosis not present

## 2015-01-04 DIAGNOSIS — I10 Essential (primary) hypertension: Secondary | ICD-10-CM

## 2015-01-04 DIAGNOSIS — M109 Gout, unspecified: Secondary | ICD-10-CM | POA: Insufficient documentation

## 2015-01-04 NOTE — Progress Notes (Signed)
Garret Reddish, MD  Subjective:  [name] is a 69 y.o. year old very pleasant female patient who presents for/with See problem oriented charting ROS- chronic neuropathic pain in feet as well as in area of prior shingles. No chest pain or shortness of breath at rest. Has had some swollen red joints.   Past Medical History-  Patient Active Problem List   Diagnosis Date Noted  . Chronic pain syndrome 04/20/2009    Priority: High  . CVA (cerebral infarction) 01/05/2008    Priority: High  . Type 2 diabetes mellitus with neurological manifestations, controlled ([place]) 11/01/2005    Priority: High  . Gout 01/04/2015    Priority: Medium  . Osteopenia 07/05/2014    Priority: Medium  . Anemia 11/24/2013    Priority: Medium  . Abnormal transaminases 11/24/2013    Priority: Medium  . CKD (chronic kidney disease), stage III 01/11/2010    Priority: Medium  . Edema 05/13/2009    Priority: Medium  . Obesity 10/23/2007    Priority: Medium  . Depression 11/01/2005    Priority: Medium  . Essential hypertension 11/01/2005    Priority: Medium  . Rosacea 11/24/2013    Priority: Low  . Shingles 11/24/2013    Priority: Low  . OA (osteoarthritis) of knee 12/28/2012    Priority: Low  . Ventral hernia 01/17/2010    Priority: Low  . GERD 04/12/2009    Priority: Low    Medications- reviewed and updated Current Outpatient Prescriptions  Medication Sig Dispense Refill  . BD INSULIN SYRINGE ULTRAFINE 31G X 5/16" 0.3 ML MISC USE AS DIRECTED TWICE DAILY. 100 each 5  . docusate sodium (COLACE) 50 MG capsule Take 100 mg by mouth daily.     . furosemide (LASIX) 40 MG tablet TAKE 1 TABLET TWICE DAILY. 180 tablet 3  . glipiZIDE (GLUCOTROL XL) 10 MG 24 hr tablet TAKE 1 TABLET TWICE DAILY. 180 tablet 6  . glucose blood (FREESTYLE LITE) test strip Check blood sugars 4 times daily. Dx:E11.9 100 each 5  . insulin NPH-regular Human (NOVOLIN 70/30) (70-30) 100 UNIT/ML injection Inject 26-30 Units into the  skin 2 (two) times daily with a meal. 20 mL 5  . Lancets (FREESTYLE) lancets Test blood sugars 4 times daily. Dx: E11.9 100 each 5  . metFORMIN (GLUCOPHAGE) 1000 MG tablet TAKE 1 TABLET TWICE DAILY WITH FOOD. 180 tablet 1  . metroNIDAZOLE (METROCREAM) 0.75 % cream APPLY TO AFFECTED AREA TWICE A DAY. 45 g 1  . omeprazole (PRILOSEC) 20 MG capsule Take 20 mg by mouth daily as needed (heartburn).     . potassium chloride SA (K-DUR,KLOR-CON) 20 MEQ tablet TAKE 1 TABLET TWICE DAILY. 180 tablet 3  . PRILOSEC OTC 20 MG tablet TAKE 1 TABLET ONCE DAILY. 30 tablet 1  . telmisartan (MICARDIS) 80 MG tablet TAKE 1 TABLET ONCE DAILY. 90 tablet 3  . HYDROcodone-acetaminophen (NORCO/VICODIN) 5-325 MG tablet Take 0.5-1 tablets by mouth 2 (two) times daily as needed (either 0.5 tab twice a day, or full tab once a day). (Patient not taking: Reported on 01/04/2015) 90 tablet 0  . hydrocortisone (ANUSOL-HC) 2.5 % rectal cream Place 1 application rectally 2 (two) times daily as needed for hemorrhoids or itching.    . methocarbamol (ROBAXIN) 500 MG tablet Take 1 tablet (500 mg total) by mouth every 6 (six) hours as needed for muscle spasms. (Patient not taking: Reported on 10/11/2014) 80 tablet 0  . valACYclovir (VALTREX) 500 MG tablet Take 1 tablet by  mouth as needed (flair).     . [DISCONTINUED] potassium chloride (KLOR-CON) 20 MEQ packet Take 20 mEq by mouth 2 (two) times daily.       No current facility-administered medications for this visit.    Objective: BP 148/70 mmHg  Temp(Src) 98.1 F (36.7 C)  Wt 270 lb (122.471 kg) Gen: NAD, resting comfortably CV: RRR  Lungs: no wheeze, rhonchi. nonlabored Abdomen: soft/nontender/nondistended/normal bowel sounds. obese Ext: stable edema Skin: warm, dry, no rash Neuro: grossly normal, moves all extremities, walks with cane in 1 hand  Assessment/Plan:  Essential hypertension S: poorly controlled. On Micardis 80mg , lasix 40mg . High stress today as trying to get to  see daughter in hospital in ICU BP Readings from Last 3 Encounters:  01/04/15 148/70  11/29/14 130/76  10/11/14 144/72  A/P:Continue current meds:  Controlled last visit and sees me in 3 months- if remains high- would titrate up BP meds.    Type 2 diabetes mellitus with neurological manifestations, controlled ([place]) S: poorly controlled. On Novolin 70/30 28 am and 26 units PM, metformin 1g BID, glipizide 10mg  BID CBGs- Around 100 in AM, seldom below 100. Nothing in 57s or 80s. Can be up to 150-160. Does not check before dinner Exercise and diet- limited by chronic pain and admits to poor eating habits  Lab Results  Component Value Date   HGBA1C 8.3* 11/29/2014   HGBA1C 7.6* 08/27/2014   HGBA1C 7.9* 05/27/2014   A/P: titrate novolin to 29 units in AM and 27 units in PM. Push fasting into 80s or 90s as long as no lows. Hopeful a1c <8 at follow up. Did lose 5 lbs which may help. Continue metformin and glipizide  Gout S:Discussed Allopurinol given uric acid 8.5. She has had several areas of joint swelling and pain like one I saw her with prior in hand in different joints since we saw each other last.  A/P: with everything going on with daughter, patient not ready to start new medicine at this time. Gave handout and patient to consider at 3 motnh follow up.    3 months. Sooner return precautions given.

## 2015-01-04 NOTE — Patient Instructions (Signed)
So sorry to hear about your daughter  Please increase novolin 70/30 to 29 units in AM before breakfast and 27 units in PM before dinner.   We will see each other at 3 month follow up and repeat labs  You wanted to hold off on allopurinol for now. Here is information for you to consider and we will discuss at 3 month follow up  Allopurinol tablets What is this medicine? ALLOPURINOL (al oh PURE i nole) reduces the amount of uric acid the body makes. It is used to treat the symptoms of gout. It is also used to treat or prevent high uric acid levels that occur as a result of certain types of chemotherapy. This medicine may also help patients who frequently have kidney stones. This medicine may be used for other purposes; ask your health care provider or pharmacist if you have questions. What should I tell my health care provider before I take this medicine? They need to know if you have any of these conditions: -kidney or liver disease -an unusual or allergic reaction to allopurinol, other medicines, foods, dyes, or preservatives -pregnant or trying to get pregnant -breast feeding How should I use this medicine? Take this medicine by mouth with a glass of water. Follow the directions on the prescription label. If this medicine upsets your stomach, take it with food or milk. Take your doses at regular intervals. Do not take your medicine more often than directed. Talk to your pediatrician regarding the use of this medicine in children. Special care may be needed. While this drug may be prescribed for children as young as 6 years for selected conditions, precautions do apply. Overdosage: If you think you have taken too much of this medicine contact a poison control center or emergency room at once. NOTE: This medicine is only for you. Do not share this medicine with others. What if I miss a dose? If you miss a dose, take it as soon as you can. If it is almost time for your next dose, take only that  dose. Do not take double or extra doses. What may interact with this medicine? Do not take this medicine with the following medication: -didanosine, ddI This medicine may also interact with the following medications: -amoxicillin or ampicillin -azathioprine -certain medicines used to treat gout -certain types of diuretics -chlorpropamide -cyclosporine -dicumarol -mercaptopurine -tolbutamide -warfarin This list may not describe all possible interactions. Give your health care provider a list of all the medicines, herbs, non-prescription drugs, or dietary supplements you use. Also tell them if you smoke, drink alcohol, or use illegal drugs. Some items may interact with your medicine. What should I watch for while using this medicine? Visit your doctor or health care professional for regular checks on your progress. If you are taking this medicine to treat gout, you may not have less frequent attacks at first. Keep taking your medicine regularly and the attacks should get better within 2 to 6 weeks. Drink plenty of water (10 to 12 full glasses a day) while you are taking this medicine. This will help to reduce stomach upset and reduce the risk of getting gout or kidney stones. Call your doctor or health care professional at once if you get a skin rash together with chills, fever, sore throat, or nausea and vomiting, if you have blood in your urine, or difficulty passing urine. Do not take vitamin C without asking your doctor or health care professional. Too much vitamin C can increase the chance of  getting kidney stones. You may get drowsy or dizzy. Do not drive, use machinery, or do anything that needs mental alertness until you know how this drug affects you. Do not stand or sit up quickly, especially if you are an older patient. This reduces the risk of dizzy or fainting spells. Alcohol can make you more drowsy and dizzy. Alcohol can also increase the chance of stomach problems and increase the  amount of uric acid in your blood. Avoid alcoholic drinks. What side effects may I notice from receiving this medicine? Side effects that you should report to your doctor or health care professional as soon as possible: -allergic reactions like skin rash, itching or hives, swelling of the face, lips, or tongue -breathing problems -muscle aches or pains -redness, blistering, peeling or loosening of the skin, including inside the mouth Side effects that usually do not require medical attention (report to your doctor or health care professional if they continue or are bothersome): -changes in taste -diarrhea -indigestion -stomach pain or cramps This list may not describe all possible side effects. Call your doctor for medical advice about side effects. You may report side effects to FDA at 1-800-FDA-1088. Where should I keep my medicine? Keep out of the reach of children. Store at room temperature between 15 and 25 degrees C (59 and 77 degrees F). Protect from light and moisture. Throw away any unused medicine after the expiration date. NOTE: This sheet is a summary. It may not cover all possible information. If you have questions about this medicine, talk to your doctor, pharmacist, or health care provider.    2016, [employer]. (2007-07-21 14:26:54)

## 2015-01-04 NOTE — Assessment & Plan Note (Signed)
S:Discussed Allopurinol given uric acid 8.5. She has had several areas of joint swelling and pain like one I saw her with prior in hand in different joints since we saw each other last.  A/P: with everything going on with daughter, patient not ready to start new medicine at this time. Gave handout and patient to consider at 3 motnh follow up.

## 2015-01-04 NOTE — Assessment & Plan Note (Signed)
S: poorly controlled. On Micardis 80mg , lasix 40mg . High stress today as trying to get to see daughter in hospital in ICU BP Readings from Last 3 Encounters:  01/04/15 148/70  11/29/14 130/76  10/11/14 144/72  A/P:Continue current meds:  Controlled last visit and sees me in 3 months- if remains high- would titrate up BP meds.

## 2015-01-04 NOTE — Assessment & Plan Note (Addendum)
S: poorly controlled. On Novolin 70/30 28 am and 26 units PM, metformin 1g BID, glipizide 10mg  BID CBGs- Around 100 in AM, seldom below 100. Nothing in 41s or 80s. Can be up to 150-160. Does not check before dinner Exercise and diet- limited by chronic pain and admits to poor eating habits  Lab Results  Component Value Date   HGBA1C 8.3* 11/29/2014   HGBA1C 7.6* 08/27/2014   HGBA1C 7.9* 05/27/2014   A/P: titrate novolin to 29 units in AM and 27 units in PM. Push fasting into 80s or 90s as long as no lows. Hopeful a1c <8 at follow up. Did lose 5 lbs which may help. Continue metformin and glipizide

## 2015-01-14 ENCOUNTER — Other Ambulatory Visit: Payer: Self-pay | Admitting: Family Medicine

## 2015-02-10 ENCOUNTER — Other Ambulatory Visit: Payer: Self-pay | Admitting: Family Medicine

## 2015-02-25 ENCOUNTER — Other Ambulatory Visit: Payer: Self-pay | Admitting: Internal Medicine

## 2015-03-08 LAB — HM DIABETES EYE EXAM

## 2015-03-15 ENCOUNTER — Encounter: Payer: Self-pay | Admitting: Family Medicine

## 2015-04-05 ENCOUNTER — Ambulatory Visit (INDEPENDENT_AMBULATORY_CARE_PROVIDER_SITE_OTHER): Payer: BC Managed Care – PPO | Admitting: Family Medicine

## 2015-04-05 ENCOUNTER — Encounter: Payer: Self-pay | Admitting: Family Medicine

## 2015-04-05 VITALS — BP 144/70 | HR 94 | Temp 97.9°F | Wt 275.0 lb

## 2015-04-05 DIAGNOSIS — M1A09X0 Idiopathic chronic gout, multiple sites, without tophus (tophi): Secondary | ICD-10-CM | POA: Diagnosis not present

## 2015-04-05 DIAGNOSIS — E1149 Type 2 diabetes mellitus with other diabetic neurological complication: Secondary | ICD-10-CM | POA: Diagnosis not present

## 2015-04-05 DIAGNOSIS — I5030 Unspecified diastolic (congestive) heart failure: Secondary | ICD-10-CM | POA: Insufficient documentation

## 2015-04-05 DIAGNOSIS — I10 Essential (primary) hypertension: Secondary | ICD-10-CM | POA: Diagnosis not present

## 2015-04-05 DIAGNOSIS — I5033 Acute on chronic diastolic (congestive) heart failure: Secondary | ICD-10-CM | POA: Diagnosis not present

## 2015-04-05 DIAGNOSIS — G894 Chronic pain syndrome: Secondary | ICD-10-CM

## 2015-04-05 LAB — POCT GLYCOSYLATED HEMOGLOBIN (HGB A1C): HEMOGLOBIN A1C: 8.6

## 2015-04-05 MED ORDER — COLCHICINE 0.6 MG PO TABS: 0.6000 mg | ORAL_TABLET | Freq: Every day | ORAL | Status: DC | PRN

## 2015-04-05 MED ORDER — METHOCARBAMOL 500 MG PO TABS: 500.0000 mg | ORAL_TABLET | Freq: Four times a day (QID) | ORAL | Status: AC | PRN

## 2015-04-05 MED ORDER — ALLOPURINOL 100 MG PO TABS: 100.0000 mg | ORAL_TABLET | Freq: Every day | ORAL | Status: DC

## 2015-04-05 MED ORDER — HYDROCODONE-ACETAMINOPHEN 5-325 MG PO TABS: 1.0000 | ORAL_TABLET | Freq: Every day | ORAL | Status: DC | PRN

## 2015-04-05 NOTE — Progress Notes (Signed)
Garret Reddish, MD  Subjective:  [name] is a 70 y.o. year old very pleasant female patient who presents for/with See problem oriented charting ROS- has some dyspnea on exertion. No chest pain. No shortness of breath at rest. No hypoglycemia  Past Medical History-  Patient Active Problem List   Diagnosis Date Noted  . Diastolic CHF ([place]) [id number]    Priority: High  . Chronic pain syndrome 04/20/2009    Priority: High  . CVA (cerebral infarction) 01/05/2008    Priority: High  . Type 2 diabetes mellitus with neurological manifestations, controlled ([place]) 11/01/2005    Priority: High  . Gout 01/04/2015    Priority: Medium  . Osteopenia 07/05/2014    Priority: Medium  . Anemia 11/24/2013    Priority: Medium  . Abnormal transaminases 11/24/2013    Priority: Medium  . CKD (chronic kidney disease), stage III 01/11/2010    Priority: Medium  . Edema 05/13/2009    Priority: Medium  . Obesity 10/23/2007    Priority: Medium  . Depression 11/01/2005    Priority: Medium  . Essential hypertension 11/01/2005    Priority: Medium  . Rosacea 11/24/2013    Priority: Low  . Shingles 11/24/2013    Priority: Low  . OA (osteoarthritis) of knee 12/28/2012    Priority: Low  . Ventral hernia 01/17/2010    Priority: Low  . GERD 04/12/2009    Priority: Low    Medications- reviewed and updated Current Outpatient Prescriptions  Medication Sig Dispense Refill  . allopurinol (ZYLOPRIM) 100 MG tablet Take 1 tablet (100 mg total) by mouth daily. 30 tablet 6  . colchicine 0.6 MG tablet Take 1 tablet (0.6 mg total) by mouth daily as needed. 30 tablet 3  . docusate sodium (COLACE) 50 MG capsule Take 100 mg by mouth daily.     . furosemide (LASIX) 40 MG tablet TAKE 1 TABLET TWICE DAILY. 180 tablet 3  . glipiZIDE (GLUCOTROL XL) 10 MG 24 hr tablet TAKE 1 TABLET TWICE DAILY. 180 tablet 6  . glucose blood (FREESTYLE LITE) test strip Check blood sugars 4 times daily. Dx:E11.9 100 each 5  .  HYDROcodone-acetaminophen (NORCO/VICODIN) 5-325 MG tablet Take 1 tablet by mouth daily as needed. 90 tablet 0  . hydrocortisone (ANUSOL-HC) 2.5 % rectal cream Place 1 application rectally 2 (two) times daily as needed for hemorrhoids or itching.    . insulin NPH-regular Human (NOVOLIN 70/30) (70-30) 100 UNIT/ML injection Inject 26-30 Units into the skin 2 (two) times daily with a meal. 20 mL 5  . Insulin Syringe-Needle U-100 (BD INSULIN SYRINGE ULTRAFINE) 31G X 5/16" 0.3 ML MISC Use to test blood sugars twice daily. Dx: E11.9 100 each 11  . Lancets (FREESTYLE) lancets Test blood sugars 4 times daily. Dx: E11.9 100 each 5  . metFORMIN (GLUCOPHAGE) 1000 MG tablet TAKE 1 TABLET TWICE DAILY WITH FOOD. 180 tablet 6  . methocarbamol (ROBAXIN) 500 MG tablet Take 1 tablet (500 mg total) by mouth every 6 (six) hours as needed for muscle spasms. 80 tablet 0  . metroNIDAZOLE (METROCREAM) 0.75 % cream APPLY TO AFFECTED AREA TWICE A DAY. 45 g 1  . omeprazole (PRILOSEC) 20 MG capsule Take 20 mg by mouth daily as needed (heartburn).     . potassium chloride SA (K-DUR,KLOR-CON) 20 MEQ tablet TAKE 1 TABLET TWICE DAILY. 180 tablet 3  . PRILOSEC OTC 20 MG tablet TAKE 1 TABLET ONCE DAILY. 84 tablet 0  . telmisartan (MICARDIS) 80 MG tablet TAKE 1  TABLET ONCE DAILY. 90 tablet 3  . valACYclovir (VALTREX) 500 MG tablet Take 1 tablet by mouth as needed (flair).     . [DISCONTINUED] potassium chloride (KLOR-CON) 20 MEQ packet Take 20 mEq by mouth 2 (two) times daily.       No current facility-administered medications for this visit.    Objective: BP 144/70 mmHg  Pulse 94  Temp(Src) 97.9 F (36.6 C)  Wt 275 lb (124.739 kg) Gen: NAD, resting comfortably CV: RRR no murmurs rubs or gallops Lungs: CTAB no crackles, wheeze, rhonchi Abdomen: soft/nontender/nondistended/normal bowel sounds. Morbid obesity Ext: 1+ pitting edema Skin: warm, dry Neuro: grossly normal, moves all extremities, walk is antalgic- uses cane for  right arm that supports her at forearm  Assessment/Plan:  Essential hypertension S: poor control. Compliant micardis. Misses one of 2 lasix doses most days. Fluid overloaded A/P: increase lasix to BID- suspect as fluid status improves- BP will also improve. Follow up 3 months- can home monitor until that time   Diastolic CHF ([place]) S: [id number] echo shows diastolic dysfunction. Has had to be on lasix BID for prolonged period. Very busy recently and only on once a day. She has had increase in her shortness of breath with exertion and swelling since that time- does not resolve in morning when feet elevated overnight A/P: increase lasix to BID for at least 3-5 days to see if improvement. Likely will need to continue at prior dose. 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    Gout S: continues to have intermittent joint swelling and pain with last uric acid 8.5 A/P: start allopurinol 100mg - use colcrys for first 2 weeks for proplylaxis. Check uric acid next visit- likely will need higher allopurinol dose. After proplyactic dose can use prn for flares as well- colcrys   Chronic pain syndrome S:Due to diabetic neuropathy and post herpetic neuralgia- Vicodin 1 tab per day  A/P: more recently has needed full tab at bedtime. Asks about going up on dose overall- we discussed likelihood that tolerance would build- prefer to remain on same dose- no increase. #90 vicodin for 90 days given  Type 2 diabetes mellitus with neurological manifestations, controlled ([place]) S: poorly controlled. On 29/27 novolin. Metformin max and glipize max CBGs- up to 200s in Am Exercise and diet- weight up 5 lbs, inactivity worse. Stress higher as well as stress eating with daughter dealing with need for liver transplant  Lab Results  Component Value Date   HGBA1C 8.6 04/05/2015   HGBA1C 8.3* 11/29/2014   HGBA1C 7.6* 08/27/2014    A/P: titrate insulin to 30 units BID- follow up in 3 months.  Discussed hypoglycemia risk but with how high fasting is- doubt 4 unit increase total per day will lead to this. No other change to Dm meds- work on weight loss as well   3 months and a day verbal. Return precautions advised.   Orders Placed This Encounter  Procedures  . POC HgB A1c    Meds ordered this encounter  Medications  . HYDROcodone-acetaminophen (NORCO/VICODIN) 5-325 MG tablet    Sig: Take 1 tablet by mouth daily as needed.    Dispense:  90 tablet    Refill:  0  . methocarbamol (ROBAXIN) 500 MG tablet    Sig: Take 1 tablet (500 mg total) by mouth every 6 (six) hours as needed for muscle spasms.    Dispense:  80 tablet    Refill:  0  .  allopurinol (ZYLOPRIM) 100 MG tablet    Sig: Take 1 tablet (100 mg total) by mouth daily.    Dispense:  30 tablet    Refill:  6  . colchicine 0.6 MG tablet    Sig: Take 1 tablet (0.6 mg total) by mouth daily as needed.    Dispense:  30 tablet    Refill:  3

## 2015-04-05 NOTE — Assessment & Plan Note (Addendum)
S:Due to diabetic neuropathy and post herpetic neuralgia- Vicodin 1 tab per day  A/P: more recently has needed full tab at bedtime. Asks about going up on dose overall- we discussed likelihood that tolerance would build- prefer to remain on same dose- no increase. #90 vicodin for 90 days given

## 2015-04-05 NOTE — Assessment & Plan Note (Signed)
S: continues to have intermittent joint swelling and pain with last uric acid 8.5 A/P: start allopurinol 100mg - use colcrys for first 2 weeks for proplylaxis. Check uric acid next visit- likely will need higher allopurinol dose. After proplyactic dose can use prn for flares as well- colcrys

## 2015-04-05 NOTE — Patient Instructions (Addendum)
Try to push your lasix back to twice a day- you have more fluid than you normally do and this seems to be running blood pressure up  Diabetes poorly controlled at 8.7. Increase insulin to 30 units twice a day as long as no sugar under 70. Can back down to 29,27 if needed.   Start allopurinol at 100mg . For first 2 weeks, take colchicine daily. Do not start allopurinol without the colchicine (sometimes we have issues with approval)  Refilled vicodin. Will need to get around signing pain contract.

## 2015-04-05 NOTE — Assessment & Plan Note (Signed)
S: poorly controlled. On 29/27 novolin. Metformin max and glipize max CBGs- up to 200s in Am Exercise and diet- weight up 5 lbs, inactivity worse. Stress higher as well as stress eating with daughter dealing with need for liver transplant  Lab Results  Component Value Date   HGBA1C 8.6 04/05/2015   HGBA1C 8.3* 11/29/2014   HGBA1C 7.6* 08/27/2014    A/P: titrate insulin to 30 units BID- follow up in 3 months. Discussed hypoglycemia risk but with how high fasting is- doubt 4 unit increase total per day will lead to this. No other change to Dm meds- work on weight loss as well

## 2015-04-05 NOTE — Assessment & Plan Note (Signed)
S: poor control. Compliant micardis. Misses one of 2 lasix doses most days. Fluid overloaded A/P: increase lasix to BID- suspect as fluid status improves- BP will also improve. Follow up 3 months- can home monitor until that time

## 2015-04-05 NOTE — Assessment & Plan Note (Signed)
S: 2014 echo shows diastolic dysfunction. Has had to be on lasix BID for prolonged period. Very busy recently and only on once a day. She has had increase in her shortness of breath with exertion and swelling since that time- does not resolve in morning when feet elevated overnight A/P: increase lasix to BID for at least 3-5 days to see if improvement. Likely will need to continue at prior dose. 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

## 2015-04-21 ENCOUNTER — Other Ambulatory Visit: Payer: Self-pay | Admitting: General Practice

## 2015-04-21 MED ORDER — GLUCOSE BLOOD VI STRP: ORAL_STRIP | Status: DC

## 2015-04-22 ENCOUNTER — Telehealth: Payer: Self-pay | Admitting: Family Medicine

## 2015-04-22 MED ORDER — GLUCOSE BLOOD VI STRP: ORAL_STRIP | Status: AC

## 2015-04-22 MED ORDER — ONETOUCH LANCETS MISC: Status: AC

## 2015-04-22 NOTE — Telephone Encounter (Signed)
[facility] called to request pt change test strips and lancets due to pt having a new meter. Pt has verio one touch and will now be getting the One touch verio test strips   One touch delica lancets to go with that. 90 day with one refill  Please make that change. Testing is still the same.

## 2015-04-22 NOTE — Telephone Encounter (Signed)
New Rx sent.

## 2015-04-24 ENCOUNTER — Other Ambulatory Visit: Payer: Self-pay | Admitting: Family Medicine

## 2015-07-13 ENCOUNTER — Telehealth: Payer: Self-pay | Admitting: Family Medicine

## 2015-07-13 ENCOUNTER — Encounter: Payer: Self-pay | Admitting: Family Medicine

## 2015-07-13 ENCOUNTER — Other Ambulatory Visit: Payer: Self-pay | Admitting: Family Medicine

## 2015-07-13 ENCOUNTER — Ambulatory Visit (INDEPENDENT_AMBULATORY_CARE_PROVIDER_SITE_OTHER): Payer: BC Managed Care – PPO | Admitting: Family Medicine

## 2015-07-13 VITALS — BP 138/72 | HR 95

## 2015-07-13 DIAGNOSIS — Z7289 Other problems related to lifestyle: Secondary | ICD-10-CM

## 2015-07-13 DIAGNOSIS — M1A09X0 Idiopathic chronic gout, multiple sites, without tophus (tophi): Secondary | ICD-10-CM | POA: Diagnosis not present

## 2015-07-13 DIAGNOSIS — I5033 Acute on chronic diastolic (congestive) heart failure: Secondary | ICD-10-CM | POA: Diagnosis not present

## 2015-07-13 DIAGNOSIS — E1149 Type 2 diabetes mellitus with other diabetic neurological complication: Secondary | ICD-10-CM | POA: Diagnosis not present

## 2015-07-13 DIAGNOSIS — I10 Essential (primary) hypertension: Secondary | ICD-10-CM

## 2015-07-13 DIAGNOSIS — G894 Chronic pain syndrome: Secondary | ICD-10-CM

## 2015-07-13 LAB — CBC
HEMATOCRIT: 36.3 % (ref 36.0–46.0)
HEMOGLOBIN: 11.9 g/dL — AB (ref 12.0–15.0)
MCHC: 32.8 g/dL (ref 30.0–36.0)
MCV: 82.9 fl (ref 78.0–100.0)
PLATELETS: 354 10*3/uL (ref 150.0–400.0)
RBC: 4.37 Mil/uL (ref 3.87–5.11)
RDW: 17 % — ABNORMAL HIGH (ref 11.5–15.5)
WBC: 8.9 10*3/uL (ref 4.0–10.5)

## 2015-07-13 LAB — COMPREHENSIVE METABOLIC PANEL
ALBUMIN: 4 g/dL (ref 3.5–5.2)
ALK PHOS: 74 U/L (ref 39–117)
ALT: 37 U/L — ABNORMAL HIGH (ref 0–35)
AST: 44 U/L — ABNORMAL HIGH (ref 0–37)
BUN: 17 mg/dL (ref 6–23)
CALCIUM: 9.3 mg/dL (ref 8.4–10.5)
CHLORIDE: 100 meq/L (ref 96–112)
CO2: 24 mEq/L (ref 19–32)
Creatinine, Ser: 0.98 mg/dL (ref 0.40–1.20)
GFR: 59.87 mL/min — AB (ref >60.00)
Glucose, Bld: 226 mg/dL — ABNORMAL HIGH (ref 70–99)
POTASSIUM: 4.6 meq/L (ref 3.5–5.1)
SODIUM: 136 meq/L (ref 135–145)
TOTAL PROTEIN: 7.3 g/dL (ref 6.0–8.3)
Total Bilirubin: 0.7 mg/dL (ref 0.2–1.2)

## 2015-07-13 LAB — LDL CHOLESTEROL, DIRECT: LDL DIRECT: 41 mg/dL

## 2015-07-13 LAB — HEMOGLOBIN A1C: HEMOGLOBIN A1C: 8.5 % — AB (ref 4.6–6.5)

## 2015-07-13 MED ORDER — HYDROCODONE-ACETAMINOPHEN 5-325 MG PO TABS: 1.0000 | ORAL_TABLET | Freq: Every day | ORAL | Status: DC | PRN

## 2015-07-13 NOTE — Assessment & Plan Note (Signed)
S: vicodin 1 tab per day total. #90 vicodin for 90 days. For  diabetic neuropathy and post herpetic neuralgia A/P: continue 1/2 tab BID- no increase in medication, pain contract next visit

## 2015-07-13 NOTE — Progress Notes (Signed)
Subjective:  [name] is a 70 y.o. year old very pleasant female patient who presents for/with See problem oriented charting ROS- no worsening orthopnea or PND, SOB improved off allopurinol. No fever, chills, blurry vision. .see any ROS included in HPI as well.   Past Medical History-  Patient Active Problem List   Diagnosis Date Noted  . Diastolic CHF ([name]) [id number]    Priority: High  . Chronic pain syndrome 04/20/2009    Priority: High  . CVA (cerebral infarction) 01/05/2008    Priority: High  . Type 2 diabetes mellitus with neurological manifestations, controlled ([name]) 11/01/2005    Priority: High  . Gout 01/04/2015    Priority: Medium  . Osteopenia 07/05/2014    Priority: Medium  . Anemia 11/24/2013    Priority: Medium  . Abnormal transaminases 11/24/2013    Priority: Medium  . CKD (chronic kidney disease), stage III 01/11/2010    Priority: Medium  . Edema 05/13/2009    Priority: Medium  . Obesity 10/23/2007    Priority: Medium  . Depression 11/01/2005    Priority: Medium  . Essential hypertension 11/01/2005    Priority: Medium  . Rosacea 11/24/2013    Priority: Low  . Shingles 11/24/2013    Priority: Low  . OA (osteoarthritis) of knee 12/28/2012    Priority: Low  . Ventral hernia 01/17/2010    Priority: Low  . GERD 04/12/2009    Priority: Low    Medications- reviewed and updated Current Outpatient Prescriptions  Medication Sig Dispense Refill  . docusate sodium (COLACE) 50 MG capsule Take 100 mg by mouth daily.     . furosemide (LASIX) 40 MG tablet TAKE 1 TABLET TWICE DAILY. 180 tablet 3  . glipiZIDE (GLUCOTROL XL) 10 MG 24 hr tablet TAKE 1 TABLET TWICE DAILY. 180 tablet 6  . glucose blood (ONETOUCH VERIO) test strip Test 4 times daily. Dx E11.9 100 each 12  . HYDROcodone-acetaminophen (NORCO/VICODIN) 5-325 MG tablet Take 1 tablet by mouth daily as needed. 90 tablet 0  . hydrocortisone (ANUSOL-HC) 2.5 % rectal cream Place 1 application rectally 2 (two)  times daily as needed for hemorrhoids or itching.    . Insulin Syringe-Needle U-100 (BD INSULIN SYRINGE ULTRAFINE) 31G X 5/16" 0.3 ML MISC Use to test blood sugars twice daily. Dx: E11.9 100 each 11  . metFORMIN (GLUCOPHAGE) 1000 MG tablet TAKE 1 TABLET TWICE DAILY WITH FOOD. 180 tablet 6  . methocarbamol (ROBAXIN) 500 MG tablet Take 1 tablet (500 mg total) by mouth every 6 (six) hours as needed for muscle spasms. 80 tablet 0  . metroNIDAZOLE (METROCREAM) 0.75 % cream APPLY TO AFFECTED AREA TWICE A DAY. 45 g 1  . NOVOLIN 70/30 (70-30) 100 UNIT/ML injection INJECT 26-30 UNITS INTO THE SKIN TWICE A DAY WITH A MEAL. 10 mL 11  . ONE TOUCH LANCETS MISC Test 4 times daily. Dx E11.9 100 each 12  . potassium chloride SA (K-DUR,KLOR-CON) 20 MEQ tablet TAKE 1 TABLET TWICE DAILY. 180 tablet 3  . PRILOSEC OTC 20 MG tablet TAKE 1 TABLET ONCE DAILY. 84 tablet 0  . telmisartan (MICARDIS) 80 MG tablet TAKE 1 TABLET ONCE DAILY. 90 tablet 3  . valACYclovir (VALTREX) 500 MG tablet Take 1 tablet by mouth as needed (flair).     . [DISCONTINUED] potassium chloride (KLOR-CON) 20 MEQ packet Take 20 mEq by mouth 2 (two) times daily.       No current facility-administered medications for this visit.    Objective: BP 138/72  mmHg  Pulse 95  SpO2 98% Gen: NAD, resting comfortably CV: RRR no murmurs rubs or gallops Lungs: CTAB no crackles, wheeze, rhonchi Abdomen: soft/nontender/nondistended/normal bowel sounds. No rebound or guarding.  Ext: 1+ nonpitting chronic edema Skin: warm, dry Neuro: grossly normal, moves all extremities Psych: tearful after loss of daughter last week  Assessment/Plan:   Type 2 diabetes mellitus with neurological manifestations, controlled ([name]) S: poorly controlled. On novolin 29/27 last visit- titrated to 30/30, also max metformin and glipizide CBGs- fastings 200s in am previously, now down into 120s and 140s Exercise and diet- refused weight.   Lab Results  Component Value Date    HGBA1C 8.6 04/05/2015   HGBA1C 8.3* 11/29/2014   HGBA1C 7.6* 08/27/2014   A/P: update a1c today, appears to have improved control- further titrate if [id number]  Diastolic CHF ([name]) S: BID lasix for prolonged period- had forgotten to take twice a day through last visit. She had increase in SOB and swellin gat that time. Did not resolve in AM last visit. Today, states did have some worsening SOB after last visit but resolved off allopurinol A/P: refused weight (recent loss of daughter and may have gained weight)- discussed how this is important in monitoring. SOB has improved from previous and no worsening edema- will continue current BID lasix dosing   Gout S: started allopurinol 100mg  last visit with 2 weeks colcrys prophylaxis. Took 1 month- urine darkened and SOB worsened- when stopped both issues resolved A/P: could have had renal issues leading to pulmonary issues potentially? Do not know as was not seen and had no Creatinine or urine at time. For now will remain off and use colcrys prn. Consider uloric in future  Chronic pain syndrome S: vicodin 1 tab per day total. #90 vicodin for 90 days. For  diabetic neuropathy and post herpetic neuralgia A/P: continue 1/2 tab BID- no increase in medication, pain contract next visit  Essential hypertension S: controlled on micardis 80mg - last visit increased lasix to BID at that time as she had been taking once a day due to being busy BP Readings from Last 3 Encounters:  07/13/15 138/72  04/05/15 144/70  01/04/15 148/70  A/P:Continue current meds:  Improved control    Return in about 3 months (around 10/13/2015).  Orders Placed This Encounter  Procedures  . CBC    [name]  . Comprehensive metabolic panel    [name]  . LDL cholesterol, direct    [name]  . Hemoglobin A1c    [place]  . Hepatitis C antibody, reflex    [facility]   Not fasting Meds ordered this encounter  Medications  . HYDROcodone-acetaminophen (NORCO/VICODIN) 5-325 MG  tablet    Sig: Take 1 tablet by mouth daily as needed.    Dispense:  90 tablet    Refill:  0    Return precautions advised.  Garret Reddish, MD

## 2015-07-13 NOTE — Assessment & Plan Note (Addendum)
S: poorly controlled. On novolin 29/27 last visit- titrated to 30/30, also max metformin and glipizide CBGs- fastings 200s in am previously, now down into 120s and 140s Exercise and diet- refused weight.   Lab Results  Component Value Date   HGBA1C 8.6 04/05/2015   HGBA1C 8.3* 11/29/2014   HGBA1C 7.6* 08/27/2014   A/P: update a1c today, appears to have improved control- further titrate if >7.5

## 2015-07-13 NOTE — Assessment & Plan Note (Signed)
S: started allopurinol 100mg  last visit with 2 weeks colcrys prophylaxis. Took 1 month- urine darkened and SOB worsened- when stopped both issues resolved A/P: could have had renal issues leading to pulmonary issues potentially? Do not know as was not seen and had no Creatinine or urine at time. For now will remain off and use colcrys prn. Consider uloric in future

## 2015-07-13 NOTE — Assessment & Plan Note (Signed)
S: BID lasix for prolonged period- had forgotten to take twice a day through last visit. She had increase in SOB and swellin gat that time. Did not resolve in AM last visit. Today, states did have some worsening SOB after last visit but resolved off allopurinol A/P: refused weight (recent loss of daughter and may have gained weight)- discussed how this is important in monitoring. SOB has improved from previous and no worsening edema- will continue current BID lasix dosing

## 2015-07-13 NOTE — Assessment & Plan Note (Signed)
S: controlled on micardis 80mg - last visit increased lasix to BID at that time as she had been taking once a day due to being busy BP Readings from Last 3 Encounters:  07/13/15 138/72  04/05/15 144/70  01/04/15 148/70  A/P:Continue current meds:  Improved control

## 2015-07-13 NOTE — Telephone Encounter (Signed)
Pt would like to know if you will call in something mild to help her stop crying. Pt states her daughter just passed away.  Pt not resting and cries all the time. She is trying to be strong.  Pt aware you are out of the office this afternoon and tomorrow am will be ok. [place]

## 2015-07-13 NOTE — Patient Instructions (Addendum)
Continue on twice a day laisx for now  Blood pressure better this visit  Continue off allopurinol. Can use colchicine as needed for gout flares  Refilled vicodin- need 3 month visit as always  Check labs before you go nonfasting included one time screening Hepatitis C test

## 2015-07-13 NOTE — Progress Notes (Signed)
Pre visit review using our clinic review tool, if applicable. No additional management support is needed unless otherwise documented below in the visit note. 

## 2015-07-14 LAB — HEPATITIS C ANTIBODY: HCV AB: NEGATIVE

## 2015-07-14 NOTE — Telephone Encounter (Signed)
Spoke with patient who verbalized understanding of following up with Hospice to schedule grief counseling.

## 2015-07-14 NOTE — Telephone Encounter (Signed)
Patient has been called and a voicemail message left for a return phone call.

## 2015-07-14 NOTE — Telephone Encounter (Signed)
Crying is a part of the grieving process and is completely normal. I would suggest she call hospice for grief counseling at this point to help her process these normal but difficult

## 2015-08-11 ENCOUNTER — Ambulatory Visit (INDEPENDENT_AMBULATORY_CARE_PROVIDER_SITE_OTHER): Payer: BC Managed Care – PPO | Admitting: Family Medicine

## 2015-08-11 ENCOUNTER — Encounter: Payer: Self-pay | Admitting: Family Medicine

## 2015-08-11 ENCOUNTER — Other Ambulatory Visit: Payer: Self-pay

## 2015-08-11 VITALS — BP 138/76 | HR 98 | Temp 98.1°F | Ht 64.0 in | Wt 275.0 lb

## 2015-08-11 DIAGNOSIS — Z9181 History of falling: Secondary | ICD-10-CM | POA: Diagnosis not present

## 2015-08-11 DIAGNOSIS — L570 Actinic keratosis: Secondary | ICD-10-CM | POA: Diagnosis not present

## 2015-08-11 DIAGNOSIS — E1149 Type 2 diabetes mellitus with other diabetic neurological complication: Secondary | ICD-10-CM

## 2015-08-11 DIAGNOSIS — B029 Zoster without complications: Secondary | ICD-10-CM

## 2015-08-11 MED ORDER — VALACYCLOVIR HCL 1 G PO TABS: 500.0000 mg | ORAL_TABLET | Freq: Three times a day (TID) | ORAL | Status: AC

## 2015-08-11 MED ORDER — TRAMADOL HCL 50 MG PO TABS: 50.0000 mg | ORAL_TABLET | Freq: Three times a day (TID) | ORAL | Status: DC | PRN

## 2015-08-11 NOTE — Assessment & Plan Note (Signed)
S: intermittent flares on right breast start with pain- after recent fall and needing tramadol- she has started to feel worsening pain right breast- states rash has not yet appeared A/P: will cover with valtrex 1000mg  TID for 7 days.

## 2015-08-11 NOTE — Progress Notes (Signed)
Pre visit review using our clinic review tool, if applicable. No additional management support is needed unless otherwise documented below in the visit note. 

## 2015-08-11 NOTE — Assessment & Plan Note (Signed)
S: Diabetic neuropathy, prior CVA, obese. [place] last week left hip and has had severe pain. Declined pain meds from orthopedics as wanted to get all pain medicines from one location. Taking vicodin still for regular chronci pain and requests tramadol in daytime A/P:Tramadol #30 given for short term coverage for left hip pain. If worsening symptoms- to return to ortho fof further evaluation

## 2015-08-11 NOTE — Progress Notes (Signed)
Subjective:  [name] is a 70 y.o. year old very pleasant female patient who presents for/with See problem oriented charting ROS- has right breast pain, no increased SOB above baseline, no blurry vision, extremity weaknesss.see any ROS included in HPI as well.   Past Medical History-  Patient Active Problem List   Diagnosis Date Noted  . Diastolic CHF ([place]) [id number]    Priority: High  . Chronic pain syndrome 04/20/2009    Priority: High  . CVA (cerebral infarction) 01/05/2008    Priority: High  . Type 2 diabetes mellitus with neurological manifestations, controlled ([place]) 11/01/2005    Priority: High  . Gout 01/04/2015    Priority: Medium  . Osteopenia 07/05/2014    Priority: Medium  . Anemia 11/24/2013    Priority: Medium  . Abnormal transaminases 11/24/2013    Priority: Medium  . CKD (chronic kidney disease), stage III 01/11/2010    Priority: Medium  . Edema 05/13/2009    Priority: Medium  . Obesity 10/23/2007    Priority: Medium  . Depression 11/01/2005    Priority: Medium  . Essential hypertension 11/01/2005    Priority: Medium  . Rosacea 11/24/2013    Priority: Low  . Shingles 11/24/2013    Priority: Low  . OA (osteoarthritis) of knee 12/28/2012    Priority: Low  . Ventral hernia 01/17/2010    Priority: Low  . GERD 04/12/2009    Priority: Low  . Actinic keratosis 08/11/2015  . Risk for falls 08/11/2015    Medications- reviewed and updated Current Outpatient Prescriptions  Medication Sig Dispense Refill  . docusate sodium (COLACE) 50 MG capsule Take 100 mg by mouth daily.     . furosemide (LASIX) 40 MG tablet TAKE 1 TABLET TWICE DAILY. 180 tablet 3  . glipiZIDE (GLUCOTROL XL) 10 MG 24 hr tablet TAKE 1 TABLET TWICE DAILY. 180 tablet 6  . glucose blood (ONETOUCH VERIO) test strip Test 4 times daily. Dx E11.9 100 each 12  . HYDROcodone-acetaminophen (NORCO/VICODIN) 5-325 MG tablet Take 1 tablet by mouth daily as needed. 90 tablet 0  . hydrocortisone  (ANUSOL-HC) 2.5 % rectal cream Place 1 application rectally 2 (two) times daily as needed for hemorrhoids or itching.    . Insulin Syringe-Needle U-100 (BD INSULIN SYRINGE ULTRAFINE) 31G X 5/16" 0.3 ML MISC Use to test blood sugars twice daily. Dx: E11.9 100 each 11  . metFORMIN (GLUCOPHAGE) 1000 MG tablet TAKE 1 TABLET TWICE DAILY WITH FOOD. 180 tablet 6  . methocarbamol (ROBAXIN) 500 MG tablet Take 1 tablet (500 mg total) by mouth every 6 (six) hours as needed for muscle spasms. 80 tablet 0  . metroNIDAZOLE (METROCREAM) 0.75 % cream APPLY TO AFFECTED AREA TWICE A DAY. 45 g 1  . NOVOLIN 70/30 (70-30) 100 UNIT/ML injection INJECT 26-30 UNITS INTO THE SKIN TWICE A DAY WITH A MEAL. 10 mL 11  . ONE TOUCH LANCETS MISC Test 4 times daily. Dx E11.9 100 each 12  . potassium chloride SA (K-DUR,KLOR-CON) 20 MEQ tablet TAKE 1 TABLET TWICE DAILY. 180 tablet 3  . PRILOSEC OTC 20 MG tablet TAKE 1 TABLET ONCE DAILY. 84 tablet 0  . telmisartan (MICARDIS) 80 MG tablet TAKE 1 TABLET ONCE DAILY. 90 tablet 3  . valACYclovir (VALTREX) 1000 MG tablet Take 0.5 tablets (500 mg total) by mouth 3 (three) times daily. 21 tablet 0  . traMADol (ULTRAM) 50 MG tablet Take 1 tablet (50 mg total) by mouth every 8 (eight) hours as needed. 30 tablet  0  . [DISCONTINUED] potassium chloride (KLOR-CON) 20 MEQ packet Take 20 mEq by mouth 2 (two) times daily.       No current facility-administered medications for this visit.    Objective: BP 138/76 mmHg  Pulse 98  Temp(Src) 98.1 F (36.7 C) (Oral)  Ht 5\' 4"  (1.626 m)  Wt 275 lb (124.739 kg)  BMI 47.18 kg/m2  SpO2 97% Gen: NAD, resting comfortably CV: RRR no murmurs rubs or gallops Lungs: CTAB no crackles, wheeze, rhonchi Abdomen: soft/nontender/nondistended/normal bowel sounds.  Ext: chronic stable edema Skin: warm, dry Neuro: grossly normal, moves all extremities  Does not disrobe for breast or left hip exam- left hip tender to touch  Assessment/Plan:  Type 2  diabetes mellitus with neurological manifestations, controlled ([place]) S: poorly  controlled. On Novolin 70/30 30 am and 30 units PM, metformin 1g BID, glipizide 10mg  BID CBGs- Mornings range from 92-145. 2 outliers 157 and 182. Evenings usually above 180.  Exercise and diet- very poor habits  Lab Results  Component Value Date   HGBA1C 8.5* 07/13/2015   HGBA1C 8.6 04/05/2015   HGBA1C 8.3* 11/29/2014   A/P: increase novolin 70/30 to 31 units BID- likely could increase further but will move slowly and also try to work on excess weight with 2 month follow up   Shingles S: intermittent flares on right breast start with pain- after recent fall and needing tramadol- she has started to feel worsening pain right breast- states rash has not yet appeared A/P: will cover with valtrex 1000mg  TID for 7 days.    Actinic keratosis S: complains of 2 red areas with scaling top one on left wrist and one on right chest O: erythematous areas <5 mm on left wrist and right chest with central scaling A/P: cryotherapy performed x 2- planned follow up if does not resolve. Aftercare and risks discussed.   Risk for falls S: Diabetic neuropathy, prior CVA, obese. [place] last week left hip and has had severe pain. Declined pain meds from orthopedics as wanted to get all pain medicines from one location. Taking vicodin still for regular chronci pain and requests tramadol in daytime A/P:Tramadol #30 given for short term coverage for left hip pain. If worsening symptoms- to return to ortho fof further evaluation  Meds ordered this encounter  Medications  . valACYclovir (VALTREX) 1000 MG tablet    Sig: Take 0.5 tablets (500 mg total) by mouth 3 (three) times daily.    Dispense:  21 tablet    Refill:  0  . traMADol (ULTRAM) 50 MG tablet    Sig: Take 1 tablet (50 mg total) by mouth every 8 (eight) hours as needed.    Dispense:  30 tablet    Refill:  0   The duration of face-to-face time during this visit was 25  minutes. Greater than 50% of this time was spent in counseling, explanation of diagnosis, planning of further management, and/or coordination of care.   return precautions advised.  Garret Reddish, MD

## 2015-08-11 NOTE — Assessment & Plan Note (Signed)
S: poorly  controlled. On Novolin 70/30 30 am and 30 units PM, metformin 1g BID, glipizide 10mg  BID CBGs- Mornings range from 92-145. 2 outliers 157 and 182. Evenings usually above 180.  Exercise and diet- very poor habits  Lab Results  Component Value Date   HGBA1C 8.5* 07/13/2015   HGBA1C 8.6 04/05/2015   HGBA1C 8.3* 11/29/2014   A/P: increase novolin 70/30 to 31 units BID- likely could increase further but will move slowly and also try to work on excess weight with 2 month follow up

## 2015-08-11 NOTE — Assessment & Plan Note (Signed)
S: complains of 2 red areas with scaling top one on left wrist and one on right chest O: erythematous areas <5 mm on left wrist and right chest with central scaling A/P: cryotherapy performed x 2- planned follow up if does not resolve. Aftercare and risks discussed.

## 2015-08-11 NOTE — Patient Instructions (Signed)
Increase insulin to 31 units before each meal  Obviously need weight loss  Tramadol as needed after fall in daytime- do not increase vicodin usage  Sent in valtrex for flare up of shingles  Freezes applied to 2 precancers on skin. These may blister up- cover with vaseline and bandaid if needed. If expanding redness- please come see [place]

## 2015-09-22 ENCOUNTER — Other Ambulatory Visit: Payer: Self-pay | Admitting: Family Medicine

## 2015-10-07 ENCOUNTER — Telehealth: Payer: Self-pay | Admitting: Family Medicine

## 2015-10-07 NOTE — Telephone Encounter (Signed)
Lets have her go to Saturday clinic. They could send culture to hospital if needed given patient age

## 2015-10-07 NOTE — Telephone Encounter (Signed)
Spoke with pt and pt has been scheduled for Saturday clinic. 11:15 am appt.

## 2015-10-07 NOTE — Telephone Encounter (Signed)
° °  Pt called in to say she has a bladder infection and is asking if something can ne called in for her. 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  She said she has a virus no fever just a lot of diarrhea and she would come in if she could.    [place]

## 2015-10-08 ENCOUNTER — Encounter: Payer: Self-pay | Admitting: Family Medicine

## 2015-10-08 ENCOUNTER — Ambulatory Visit (INDEPENDENT_AMBULATORY_CARE_PROVIDER_SITE_OTHER): Payer: BC Managed Care – PPO | Admitting: Family Medicine

## 2015-10-08 VITALS — BP 126/86 | HR 89 | Temp 98.0°F | Resp 17

## 2015-10-08 DIAGNOSIS — R350 Frequency of micturition: Secondary | ICD-10-CM

## 2015-10-08 LAB — POCT URINALYSIS DIPSTICK
Bilirubin, UA: NEGATIVE
Blood, UA: NEGATIVE
Glucose, UA: NEGATIVE
KETONES UA: NEGATIVE
LEUKOCYTES UA: NEGATIVE
NITRITE UA: NEGATIVE
PROTEIN UA: NEGATIVE
Spec Grav, UA: 1.02
Urobilinogen, UA: 0.2
pH, UA: 6

## 2015-10-08 NOTE — Progress Notes (Signed)
Pre visit review using our clinic review tool, if applicable. No additional management support is needed unless otherwise documented below in the visit note. 

## 2015-10-08 NOTE — Patient Instructions (Signed)
No bladder infection. Probably discomfort from the liquids you were drinking from being ill.   It was a pleasure meeting you.

## 2015-10-08 NOTE — Progress Notes (Signed)
[name] , 1946/01/18, 70 y.o., female MRN: [id number] Patient Care Team    Relationship Specialty Notifications Start End  Marin Olp, MD PCP - General Family Medicine  11/24/13      CC: diarrhea Subjective: Pt presents for an acute OV with complaints of diarrhea of  1 week duration, that is improving, but then she noticed yesterday that she started to develop a small bladder sting feeling. She has had a bladder sling. She is worried she has a bladder infection because of the diarrhea. She has been drinking soda and tea secondary to illness, which is not her normal     Allergies  Allergen Reactions  . Ibuprofen Swelling    Shortness of breath  . Iohexol Other (See Comments)    "coded" No contrast dye  . Nsaids Swelling    H/o of kidney failure   Social History  Substance Use Topics  . Smoking status: Never Smoker  . Smokeless tobacco: Never Used  . Alcohol use No   Past Medical History:  Diagnosis Date  . Arthritis   . Complication of anesthesia    after surgery: renal failure, hypotension, tachycardia, shock  . Complication of joint prosthesis (HCC)    locks up and no PT after knee replacement  . CVA (cerebral vascular accident) [facility]) 2008   "small hemmoragic" right sided face drooping, no residual or follow up, can not confirmed because of MRI, questionable  . Depression    very long time ago  . Diabetes mellitus   . GERD (gastroesophageal reflux disease)    mild, occasional  . H/O bronchitis   . H/O cardiac arrest    from contrast dye  . H/O hemorrhoids   . H/O measles   . H/O mumps   . H/O rubella   . Hypertension   . Lower extremity edema   . Lumbar pain   . Osteoarthritis   . Peripheral neuropathy (HCC)    below ankle in both feet  . Pneumonia    h/o, vaccine given  . Renal failure    "after left knee replacement, fine now" some renal damage  . Umbilical hernia    "huge"   Past Surgical History:  Procedure Laterality Date  .  ABDOMINAL HYSTERECTOMY  1993   abdominal, left ovaries  . 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 INCONTINENCE SURGERY  May 2006   SPARK, cystocelle, rectocelle  . KNEE ARTHROSCOPY     bilateral  . REPLACEMENT TOTAL KNEE Left Oct 2006  . TONSILLECTOMY  age 34  . TOTAL KNEE ARTHROPLASTY Right 01/19/2013   Procedure: RIGHT TOTAL KNEE ARTHROPLASTY;  Surgeon: Gearlean Alf, MD;  Location: [facility] ORS;  Service: Orthopedics;  Laterality: Right;   History reviewed. No pertinent family history.   Medication List       Accurate as of 10/08/15 11:50 AM. Always use your most recent med list.          docusate sodium 50 MG capsule Commonly known as:  COLACE Take 100 mg by mouth daily.   furosemide 40 MG tablet Commonly known as:  LASIX TAKE 1 TABLET TWICE DAILY.   glipiZIDE 10 MG 24 hr tablet Commonly known as:  GLUCOTROL XL TAKE 1 TABLET TWICE DAILY.   glucose blood test strip Commonly known as:  ONETOUCH VERIO Test 4 times daily. Dx E11.9   HYDROcodone-acetaminophen 5-325 MG tablet Commonly known as:  NORCO/VICODIN Take 1 tablet by mouth daily as  needed.   hydrocortisone 2.5 % rectal cream Commonly known as:  ANUSOL-HC Place 1 application rectally 2 (two) times daily as needed for hemorrhoids or itching.   Insulin Syringe-Needle U-100 31G X 5/16" 0.3 ML Misc Commonly known as:  BD INSULIN SYRINGE ULTRAFINE Use to test blood sugars twice daily. Dx: E11.9   metFORMIN 1000 MG tablet Commonly known as:  GLUCOPHAGE TAKE 1 TABLET TWICE DAILY WITH FOOD.   methocarbamol 500 MG tablet Commonly known as:  ROBAXIN Take 1 tablet (500 mg total) by mouth every 6 (six) hours as needed for muscle spasms.   metroNIDAZOLE 0.75 % cream Commonly known as:  METROCREAM APPLY TO AFFECTED AREA TWICE A DAY.   NOVOLIN 70/30 (70-30) 100 UNIT/ML injection Generic drug:  insulin NPH-regular Human INJECT 26-30 UNITS INTO THE SKIN TWICE A DAY WITH A MEAL.   ONE TOUCH LANCETS Misc Test 4 times  daily. Dx E11.9   potassium chloride SA 20 MEQ tablet Commonly known as:  K-DUR,KLOR-CON TAKE 1 TABLET TWICE DAILY.   PRILOSEC OTC 20 MG tablet Generic drug:  omeprazole TAKE 1 TABLET ONCE DAILY.   telmisartan 80 MG tablet Commonly known as:  MICARDIS TAKE 1 TABLET ONCE DAILY.   traMADol 50 MG tablet Commonly known as:  ULTRAM Take 1 tablet (50 mg total) by mouth every 8 (eight) hours as needed.   valACYclovir 1000 MG tablet Commonly known as:  VALTREX Take 0.5 tablets (500 mg total) by mouth 3 (three) times daily.       Results for orders placed or performed in visit on 10/08/15 (from the past 24 hour(s))  POCT urinalysis dipstick     Status: Normal   Collection Time: 10/08/15 11:45 AM  Result Value Ref Range   Color, UA yellow    Clarity, UA clear    Glucose, UA negative    Bilirubin, UA negative    Ketones, UA negative    Spec Grav, UA 1.020    Blood, UA negative    pH, UA 6.0    Protein, UA negative    Urobilinogen, UA 0.2    Nitrite, UA negative    Leukocytes, UA Negative Negative   No results found.   ROS: Negative, with the exception of above mentioned in HPI   Objective:  BP 126/86   Pulse 89   Temp 98 F (36.7 C) (Oral)   Resp 17   SpO2 98%  There is no height or weight on file to calculate BMI. Gen: Afebrile. No acute distress. Nontoxic in appearance, well developed, well nourished.  HENT: AT. [place].  MMM, Eyes:Pupils Equal Round Reactive to light, Extraocular movements intact,  Conjunctiva without redness, discharge or icterus. Abd: Soft. obese. NTND. BS present.  MSK: no cva tenderness  Assessment/Plan: [name] is a 70 y.o. female present for acute OV for  Urine frequency - POCT urinalysis dipstick--> normal - Likely bladder irritation. No signs of infection, pt reassured and advised to drink water.  - F/U PRN  electronically signed by:  Howard Pouch, DO  [place]

## 2015-10-10 ENCOUNTER — Other Ambulatory Visit: Payer: Self-pay | Admitting: Family Medicine

## 2015-10-14 ENCOUNTER — Ambulatory Visit (INDEPENDENT_AMBULATORY_CARE_PROVIDER_SITE_OTHER): Payer: BC Managed Care – PPO | Admitting: Family Medicine

## 2015-10-14 ENCOUNTER — Encounter: Payer: Self-pay | Admitting: Family Medicine

## 2015-10-14 VITALS — BP 130/66 | HR 102 | Temp 98.1°F

## 2015-10-14 DIAGNOSIS — E1149 Type 2 diabetes mellitus with other diabetic neurological complication: Secondary | ICD-10-CM

## 2015-10-14 DIAGNOSIS — I10 Essential (primary) hypertension: Secondary | ICD-10-CM

## 2015-10-14 DIAGNOSIS — G894 Chronic pain syndrome: Secondary | ICD-10-CM | POA: Diagnosis not present

## 2015-10-14 DIAGNOSIS — I5033 Acute on chronic diastolic (congestive) heart failure: Secondary | ICD-10-CM

## 2015-10-14 MED ORDER — HYDROCODONE-ACETAMINOPHEN 5-325 MG PO TABS: 1.0000 | ORAL_TABLET | Freq: Every day | ORAL | 0 refills | Status: DC | PRN

## 2015-10-14 NOTE — Patient Instructions (Signed)
See me in 2 months, suspect may need to titrate insulin. You declined today to check a1c  Take lasix regularly twice a day to help prevent fluid overload  Health Maintenance Due  Topic Date Due  . TETANUS/TDAP - check with insurance 06/02/2014  . INFLUENZA VACCINE - come back in October or so for this 08/30/2015

## 2015-10-14 NOTE — Assessment & Plan Note (Signed)
S: increase novolin 70/30 to 31 units BID from 30 units BID last visit. Continued on glipizide 10mg  BID, metformin 1 g BID CBGs- evenings above 180 last visit- doing better and usually below 180 Running from 90-182 throughout the day.  Lab Results  Component Value Date   HGBA1C 8.5 (H) 07/13/2015  A/P: patient does not bring log. She refuses a1c. She refuses weight- she is still struggling with loss of daughter obviously and states she doesn't want to feel down today- does agree to follow up I n2 months- did not adjust meds today

## 2015-10-14 NOTE — Assessment & Plan Note (Signed)
S: when patient does not take lasix BID she has worsening edema and eventually SOB. She started back BID a few days ago after recently feeling this way and SOB already resolved and edema improving.  A/P: refused weight today- discussed how this is important for her CHF management. She agrees to be consistent with BID lasix and if symptoms recur on this- to return to care. I question how consistent she may be with BID lasix after she gets to feeling better because that is usually when she cuts back.

## 2015-10-14 NOTE — Assessment & Plan Note (Signed)
Refilled vicodin to use 1 tab total daily

## 2015-10-14 NOTE — Progress Notes (Signed)
Pre visit review using our clinic review tool, if applicable. No additional management support is needed unless otherwise documented below in the visit note. 

## 2015-10-14 NOTE — Progress Notes (Signed)
Subjective:  [name] is a 70 y.o. year old very pleasant female patient who presents for/with See problem oriented charting ROS- some recent SOB resolved on BID lasix, edema improving, no chest pain, no fever or cough.see any ROS included in HPI as well.   Past Medical History-  Patient Active Problem List   Diagnosis Date Noted  . Diastolic CHF ([place]) [id number]    Priority: High  . Chronic pain syndrome 04/20/2009    Priority: High  . CVA (cerebral infarction) 01/05/2008    Priority: High  . Type 2 diabetes mellitus with neurological manifestations, controlled ([place]) 11/01/2005    Priority: High  . Gout 01/04/2015    Priority: Medium  . Osteopenia 07/05/2014    Priority: Medium  . Anemia 11/24/2013    Priority: Medium  . Abnormal transaminases 11/24/2013    Priority: Medium  . CKD (chronic kidney disease), stage III 01/11/2010    Priority: Medium  . Edema 05/13/2009    Priority: Medium  . Obesity 10/23/2007    Priority: Medium  . Depression 11/01/2005    Priority: Medium  . Essential hypertension 11/01/2005    Priority: Medium  . Rosacea 11/24/2013    Priority: Low  . Shingles 11/24/2013    Priority: Low  . OA (osteoarthritis) of knee 12/28/2012    Priority: Low  . Ventral hernia 01/17/2010    Priority: Low  . GERD 04/12/2009    Priority: Low  . Actinic keratosis 08/11/2015  . Risk for falls 08/11/2015    Medications- reviewed and updated Current Outpatient Prescriptions  Medication Sig Dispense Refill  . docusate sodium (COLACE) 50 MG capsule Take 100 mg by mouth daily.     . furosemide (LASIX) 40 MG tablet TAKE 1 TABLET TWICE DAILY. 180 tablet 3  . glipiZIDE (GLUCOTROL XL) 10 MG 24 hr tablet TAKE 1 TABLET TWICE DAILY. 180 tablet 6  . glucose blood (ONETOUCH VERIO) test strip Test 4 times daily. Dx E11.9 100 each 12  . HYDROcodone-acetaminophen (NORCO/VICODIN) 5-325 MG tablet Take 1 tablet by mouth daily as needed. 90 tablet 0  . hydrocortisone  (ANUSOL-HC) 2.5 % rectal cream Place 1 application rectally 2 (two) times daily as needed for hemorrhoids or itching.    . Insulin Syringe-Needle U-100 (BD INSULIN SYRINGE ULTRAFINE) 31G X 5/16" 0.3 ML MISC Use to test blood sugars twice daily. Dx: E11.9 100 each 11  . metFORMIN (GLUCOPHAGE) 1000 MG tablet TAKE 1 TABLET TWICE DAILY WITH FOOD. 180 tablet 6  . methocarbamol (ROBAXIN) 500 MG tablet Take 1 tablet (500 mg total) by mouth every 6 (six) hours as needed for muscle spasms. 80 tablet 0  . metroNIDAZOLE (METROCREAM) 0.75 % cream APPLY TO AFFECTED AREA TWICE A DAY. 45 g 1  . NOVOLIN 70/30 (70-30) 100 UNIT/ML injection INJECT 26-30 UNITS INTO THE SKIN TWICE A DAY WITH A MEAL. 10 mL 11  . ONE TOUCH LANCETS MISC Test 4 times daily. Dx E11.9 100 each 12  . potassium chloride SA (K-DUR,KLOR-CON) 20 MEQ tablet TAKE 1 TABLET TWICE DAILY. 180 tablet 1  . PRILOSEC OTC 20 MG tablet TAKE 1 TABLET ONCE DAILY. 84 tablet 0  . telmisartan (MICARDIS) 80 MG tablet TAKE 1 TABLET ONCE DAILY. 90 tablet 1  . traMADol (ULTRAM) 50 MG tablet Take 1 tablet (50 mg total) by mouth every 8 (eight) hours as needed. 30 tablet 0  . valACYclovir (VALTREX) 1000 MG tablet Take 0.5 tablets (500 mg total) by mouth 3 (three) times daily.  21 tablet 0   No current facility-administered medications for this visit.     Objective: BP 130/66 (BP Location: Left Arm, Patient Position: Sitting, Cuff Size: Large)   Pulse (!) 102   Temp 98.1 F (36.7 C) (Oral)   SpO2 96%  Gen: NAD, resting comfortably CV: RRR no murmurs rubs or gallops Lungs: CTAB no crackles, wheeze, rhonchi Abdomen: soft/nontender/nondistended/normal bowel sounds. Morbidly obese  Ext: 1+ edema Skin: warm, dry Neuro: grossly normal, moves all extremities   Assessment/Plan:  Encouraged weight loss- patient states she is trying really hard- difficult to tell where this action is- she states she will try to start back on stationary bike.   Type 2 diabetes  mellitus with neurological manifestations, controlled ([place]) S: increase novolin 70/30 to 31 units BID from 30 units BID last visit. Continued on glipizide 10mg  BID, metformin 1 g BID CBGs- evenings above 180 last visit- doing better and usually below 180 Running from 90-182 throughout the day.  Lab Results  Component Value Date   HGBA1C 8.5 (H) 07/13/2015  A/P: patient does not bring log. She refuses a1c. She refuses weight- she is still struggling with loss of daughter obviously and states she doesn't want to feel down today- does agree to follow up I n2 months- did not adjust meds today  Diastolic CHF ([place]) S: when patient does not take lasix BID she has worsening edema and eventually SOB. She started back BID a few days ago after recently feeling this way and SOB already resolved and edema improving.  A/P: refused weight today- discussed how this is important for her CHF management. She agrees to be consistent with BID lasix and if symptoms recur on this- to return to care. I question how consistent she may be with BID lasix after she gets to feeling better because that is usually when she cuts back.    Chronic pain syndrome Refilled vicodin to use 1 tab total daily  Essential hypertension S: controlled on Micardis 80mg , lasix 40mg  (at least daily though at current is BID) BP Readings from Last 3 Encounters:  10/14/15 130/66  10/08/15 126/86  08/11/15 138/76  A/P:Continue current meds:  Well controlled    Return in about 2 months (around 12/14/2015).  Meds ordered this encounter  Medications  . HYDROcodone-acetaminophen (NORCO/VICODIN) 5-325 MG tablet    Sig: Take 1 tablet by mouth daily as needed.    Dispense:  90 tablet    Refill:  0    Return precautions advised.  Garret Reddish, MD

## 2015-10-14 NOTE — Assessment & Plan Note (Signed)
S: controlled on Micardis 80mg , lasix 40mg  (at least daily though at current is BID) BP Readings from Last 3 Encounters:  10/14/15 130/66  10/08/15 126/86  08/11/15 138/76  A/P:Continue current meds:  Well controlled

## 2015-10-22 ENCOUNTER — Other Ambulatory Visit: Payer: Self-pay | Admitting: Family Medicine

## 2015-11-12 ENCOUNTER — Other Ambulatory Visit: Payer: Self-pay | Admitting: Family Medicine

## 2015-12-15 ENCOUNTER — Ambulatory Visit: Payer: BC Managed Care – PPO | Admitting: Family Medicine

## 2015-12-16 ENCOUNTER — Ambulatory Visit (INDEPENDENT_AMBULATORY_CARE_PROVIDER_SITE_OTHER): Payer: BC Managed Care – PPO | Admitting: Family Medicine

## 2015-12-16 ENCOUNTER — Encounter: Payer: Self-pay | Admitting: Family Medicine

## 2015-12-16 VITALS — BP 128/64 | HR 97 | Temp 97.9°F | Wt 277.6 lb

## 2015-12-16 DIAGNOSIS — Z23 Encounter for immunization: Secondary | ICD-10-CM | POA: Diagnosis not present

## 2015-12-16 DIAGNOSIS — I10 Essential (primary) hypertension: Secondary | ICD-10-CM

## 2015-12-16 DIAGNOSIS — I5032 Chronic diastolic (congestive) heart failure: Secondary | ICD-10-CM

## 2015-12-16 DIAGNOSIS — E1149 Type 2 diabetes mellitus with other diabetic neurological complication: Secondary | ICD-10-CM

## 2015-12-16 LAB — CBC WITH DIFFERENTIAL/PLATELET
BASOS PCT: 1 %
Basophils Absolute: 112 cells/uL (ref 0–200)
EOS ABS: 224 {cells}/uL (ref 15–500)
EOS PCT: 2 %
HCT: 36.9 % (ref 35.0–45.0)
Hemoglobin: 11.8 g/dL (ref 11.7–15.5)
Lymphocytes Relative: 26 %
Lymphs Abs: 2912 cells/uL (ref 850–3900)
MCH: 27.6 pg (ref 27.0–33.0)
MCHC: 32 g/dL (ref 32.0–36.0)
MCV: 86.4 fL (ref 80.0–100.0)
MONOS PCT: 9 %
MPV: 9.1 fL (ref 7.5–12.5)
Monocytes Absolute: 1008 cells/uL — ABNORMAL HIGH (ref 200–950)
NEUTROS ABS: 6944 {cells}/uL (ref 1500–7800)
Neutrophils Relative %: 62 %
PLATELETS: 361 10*3/uL (ref 140–400)
RBC: 4.27 MIL/uL (ref 3.80–5.10)
RDW: 16.1 % — ABNORMAL HIGH (ref 11.0–15.0)
WBC: 11.2 10*3/uL — AB (ref 3.8–10.8)

## 2015-12-16 LAB — COMPREHENSIVE METABOLIC PANEL
ALK PHOS: 89 U/L (ref 33–130)
ALT: 26 U/L (ref 6–29)
AST: 29 U/L (ref 10–35)
Albumin: 3.9 g/dL (ref 3.6–5.1)
BILIRUBIN TOTAL: 0.7 mg/dL (ref 0.2–1.2)
BUN: 25 mg/dL (ref 7–25)
CO2: 29 mmol/L (ref 20–31)
CREATININE: 1.33 mg/dL — AB (ref 0.50–0.99)
Calcium: 9.8 mg/dL (ref 8.6–10.4)
Chloride: 96 mmol/L — ABNORMAL LOW (ref 98–110)
GLUCOSE: 202 mg/dL — AB (ref 65–99)
Potassium: 5 mmol/L (ref 3.5–5.3)
SODIUM: 136 mmol/L (ref 135–146)
TOTAL PROTEIN: 7.4 g/dL (ref 6.1–8.1)

## 2015-12-16 MED ORDER — TRIAMCINOLONE ACETONIDE 0.1 % EX CREA: 1.0000 "application " | TOPICAL_CREAM | Freq: Two times a day (BID) | CUTANEOUS | 0 refills | Status: AC

## 2015-12-16 NOTE — Assessment & Plan Note (Signed)
S: lowest sugar 92. Sugars range in morning up to 170 in morning usually perhaps 130 -150. Did have 2 in the 280s but did not take AM medicines- felt poorly. Doing 31 units, 31 units. Also still taking metformin and glipizide.   Not checking in evening.  Lab Results  Component Value Date   HGBA1C 8.5 (H) 07/13/2015  A/P: check a1c today- may adjust insulin likely AM dose- hard to adjust without patient checking sugars more regularly- hopeful at least over next few days can check at night

## 2015-12-16 NOTE — Addendum Note (Signed)
Addended by: Gari Crown D on: 12/16/2015 04:16 PM   Modules accepted: Orders

## 2015-12-16 NOTE — Progress Notes (Signed)
Pre visit review using our clinic review tool, if applicable. No additional management support is needed unless otherwise documented below in the visit note. 

## 2015-12-16 NOTE — Assessment & Plan Note (Signed)
S: advised BID lasix last visit as it had improved edema and SOB. Weight up 2 lbs- patient does not eat very well. Drinks "lots of water" when she feels poorly- eats out to much. Having intermittent edema and SOB issues but not persistent worsening.  A/P: a lot of issues likely lifestyle related with excess salt, excess calories- patient very unmotivated to make changes with this at present. Continue BID lasix and if worsening weight, edema- return to care sooner

## 2015-12-16 NOTE — Assessment & Plan Note (Signed)
S: controlled on Micardis 80mg , lasix 40mg  BID, also on potassium BP Readings from Last 3 Encounters:  12/16/15 128/64  10/14/15 130/66  10/08/15 126/86  A/P:Continue current meds:  Update bmet today

## 2015-12-16 NOTE — Patient Instructions (Addendum)
Triamcinolone for rash for 10 days- can refer to dermatology if doesn't improve  Get a1c today- please check sugars next few nights- likely adjust insulin. If a1c over 8, see me in a month, if below 8- ok with 3 months. I will refill pain meds before next visit if we wait for 3 months  Will ask office manager to check into Hepatitis charge

## 2015-12-16 NOTE — Progress Notes (Signed)
Subjective:  [name] is a 70 y.o. year old very pleasant female patient who presents for/with See problem oriented charting ROS- intermittent SOb, no chest pain, intermittent worsening edema- looks fine today. No cough/congestion.see any ROS included in HPI as well.   Past Medical History-  Patient Active Problem List   Diagnosis Date Noted  . Diastolic CHF ([place]) [id number]    Priority: High  . Chronic pain syndrome 04/20/2009    Priority: High  . CVA (cerebral infarction) 01/05/2008    Priority: High  . Type 2 diabetes mellitus with neurological manifestations, controlled ([place]) 11/01/2005    Priority: High  . Gout 01/04/2015    Priority: Medium  . Osteopenia 07/05/2014    Priority: Medium  . Anemia 11/24/2013    Priority: Medium  . Abnormal transaminases 11/24/2013    Priority: Medium  . CKD (chronic kidney disease), stage III 01/11/2010    Priority: Medium  . Edema 05/13/2009    Priority: Medium  . Obesity 10/23/2007    Priority: Medium  . Depression 11/01/2005    Priority: Medium  . Essential hypertension 11/01/2005    Priority: Medium  . Rosacea 11/24/2013    Priority: Low  . Shingles 11/24/2013    Priority: Low  . OA (osteoarthritis) of knee 12/28/2012    Priority: Low  . Ventral hernia 01/17/2010    Priority: Low  . GERD 04/12/2009    Priority: Low  . Actinic keratosis 08/11/2015  . Risk for falls 08/11/2015    Medications- reviewed and updated Current Outpatient Prescriptions  Medication Sig Dispense Refill  . docusate sodium (COLACE) 50 MG capsule Take 100 mg by mouth daily.     . furosemide (LASIX) 40 MG tablet TAKE 1 TABLET TWICE DAILY. 180 tablet 1  . GLIPIZIDE XL 10 MG 24 hr tablet TAKE 1 TABLET TWICE DAILY. 180 tablet 1  . glucose blood (ONETOUCH VERIO) test strip Test 4 times daily. Dx E11.9 100 each 12  . HYDROcodone-acetaminophen (NORCO/VICODIN) 5-325 MG tablet Take 1 tablet by mouth daily as needed. 90 tablet 0  . hydrocortisone  (ANUSOL-HC) 2.5 % rectal cream Place 1 application rectally 2 (two) times daily as needed for hemorrhoids or itching.    . Insulin Syringe-Needle U-100 (BD INSULIN SYRINGE ULTRAFINE) 31G X 5/16" 0.3 ML MISC Use to test blood sugars twice daily. Dx: E11.9 100 each 11  . metFORMIN (GLUCOPHAGE) 1000 MG tablet TAKE 1 TABLET TWICE DAILY WITH FOOD. 180 tablet 6  . methocarbamol (ROBAXIN) 500 MG tablet Take 1 tablet (500 mg total) by mouth every 6 (six) hours as needed for muscle spasms. 80 tablet 0  . metroNIDAZOLE (METROCREAM) 0.75 % cream APPLY TO AFFECTED AREA TWICE A DAY. 45 g 1  . NOVOLIN 70/30 (70-30) 100 UNIT/ML injection INJECT 26-30 UNITS INTO THE SKIN TWICE A DAY WITH A MEAL. 10 mL 11  . ONE TOUCH LANCETS MISC Test 4 times daily. Dx E11.9 100 each 12  . potassium chloride SA (K-DUR,KLOR-CON) 20 MEQ tablet TAKE 1 TABLET TWICE DAILY. 180 tablet 1  . PRILOSEC OTC 20 MG tablet TAKE 1 TABLET ONCE DAILY. 84 tablet 0  . telmisartan (MICARDIS) 80 MG tablet TAKE 1 TABLET ONCE DAILY. 90 tablet 1  . traMADol (ULTRAM) 50 MG tablet Take 1 tablet (50 mg total) by mouth every 8 (eight) hours as needed. 30 tablet 0  . valACYclovir (VALTREX) 1000 MG tablet Take 0.5 tablets (500 mg total) by mouth 3 (three) times daily. 21 tablet 0  Objective: BP 128/64 (BP Location: Left Arm, Patient Position: Sitting, Cuff Size: Large)   Pulse 97   Temp 97.9 F (36.6 C) (Oral)   Wt 277 lb 9.6 oz (125.9 kg)   SpO2 97%   BMI 47.65 kg/m  Gen: NAD, resting comfortably CV: RRR no murmurs rubs or gallops Lungs: CTAB no crackles, wheeze, rhonchi Abdomen: soft/nontender/nondistended/normal bowel sounds. morbid obesity Ext: trace edema Skin: warm, dry Neuro: grossly normal, moves all extremities  Assessment/Plan:  Type 2 diabetes mellitus with neurological manifestations, controlled (HCC) S: lowest sugar 92. Sugars range in morning up to 170 in morning usually perhaps 130 -150. Did have 2 in the 280s but did not take AM  medicines- felt poorly. Doing 31 units, 31 units. Also still taking metformin and glipizide.   Not checking in evening.  Lab Results  Component Value Date   HGBA1C 8.5 (H) 07/13/2015  A/P: check a1c today- may adjust insulin likely AM dose- hard to adjust without patient checking sugars more regularly- hopeful at least over next few days can check at night  Diastolic CHF ([place]) S: advised BID lasix last visit as it had improved edema and SOB. Weight up 2 lbs- patient does not eat very well. Drinks "lots of water" when she feels poorly- eats out to much. Having intermittent edema and SOB issues but not persistent worsening.  A/P: a lot of issues likely lifestyle related with excess salt, excess calories- patient very unmotivated to make changes with this at present. Continue BID lasix and if worsening weight, edema- return to care sooner  Essential hypertension S: controlled on Micardis 80mg , lasix 40mg  BID, also on potassium BP Readings from Last 3 Encounters:  12/16/15 128/64  10/14/15 130/66  10/08/15 126/86  A/P:Continue current meds:  Update bmet today  1-3 months.   Orders Placed This Encounter  Procedures  . Flu vaccine HIGH DOSE PF  . CBC    [place]  . Comprehensive metabolic panel    [place]  . Hemoglobin A1c    [place]   Meds ordered this encounter  Medications  . triamcinolone cream (KENALOG) 0.1 %    Sig: Apply 1 application topically 2 (two) times daily. For 10 days    Dispense:  30 g    Refill:  0   Return precautions advised.  Garret Reddish, MD

## 2015-12-17 LAB — HEMOGLOBIN A1C
HEMOGLOBIN A1C: 9.5 % — AB (ref <5.7)
Mean Plasma Glucose: 226 mg/dL

## 2015-12-21 ENCOUNTER — Other Ambulatory Visit: Payer: Self-pay | Admitting: *Deleted

## 2015-12-21 MED ORDER — INSULIN NPH ISOPHANE & REGULAR (70-30) 100 UNIT/ML ~~LOC~~ SUSP: SUBCUTANEOUS | 3 refills | Status: DC

## 2016-01-02 ENCOUNTER — Ambulatory Visit (INDEPENDENT_AMBULATORY_CARE_PROVIDER_SITE_OTHER): Payer: BC Managed Care – PPO | Admitting: Family Medicine

## 2016-01-02 ENCOUNTER — Encounter: Payer: Self-pay | Admitting: Family Medicine

## 2016-01-02 VITALS — BP 140/76 | HR 76 | Temp 98.0°F

## 2016-01-02 DIAGNOSIS — R21 Rash and other nonspecific skin eruption: Secondary | ICD-10-CM | POA: Diagnosis not present

## 2016-01-02 NOTE — Progress Notes (Signed)
Pre visit review using our clinic review tool, if applicable. No additional management support is needed unless otherwise documented below in the visit note. 

## 2016-01-02 NOTE — Progress Notes (Signed)
   Subjective:    Patient ID: [name], female    DOB: [birth year], 70 y.o.   MRN: [id number]  HPI  Ms. Clerkin is a 70 year old female who presents with a rash on both legs that started one month.  She was seen and evaluated on 12/16/2015 and treated with triamcinolone which has provided limited benefit.  Associated symptoms dry skin and itching are present.She denies fever, chills, sweats, N/V/D, swelling of tongue/lips, trouble breathing, new detergents, soaps, dryer sheets, or medications. History of DM with neurological complications, diastolic CHF, and HTN. She changed therapy from triamcinolone due to burning with application to OTC hydrocortisone with aloe which  has provided benefit.  Review of Systems  Constitutional: Negative for chills, fatigue and fever.  Eyes: Negative for itching.  Respiratory: Negative for cough and wheezing.   Cardiovascular: Negative for chest pain and palpitations.  Gastrointestinal: Negative for abdominal pain, diarrhea, nausea and vomiting.  Skin:       Rash on lower extremities      Objective:   Physical Exam  Constitutional: She is oriented to person, place, and time. She appears well-developed and well-nourished.  Eyes: No scleral icterus.  Neck: Neck supple.  Cardiovascular: Normal rate and regular rhythm.   Pulmonary/Chest: Effort normal and breath sounds normal. She has no wheezes. She has no rales.  Abdominal: Soft. Bowel sounds are normal. There is no tenderness.  Musculoskeletal:  Trace edema   Lymphadenopathy:    She has no cervical adenopathy.  Neurological: She is alert and oriented to person, place, and time. Coordination normal.  Skin: Skin is warm and dry. No rash noted.  Rash on lower extremities appears to be scattered, erythematous, papules that do not extend above knees.       Assessment & Plan:  1. Rash and nonspecific skin eruption No specific trigger identified for rash. We discussed Dr. Ansel Bong recommendation of a  dermatology referral after trial of triamcinolone. No evidence of infection present. Advised patient to continue  use of OTC hydrocortisone with aloe that has provided benefit and set up an appointment with dermatology for evaluation if symptoms do not resolve with topical treatment. She voiced understanding and agreed with plan.  Delano Metz, FNP-C

## 2016-01-02 NOTE — Patient Instructions (Signed)
Please continue use of hydrocortisone with aloe and follow up with dermatology as recommended by your provider. If symptoms do not improve, worsen, or you develop new symptoms, please follow up with Dr. Yong Channel. Contact Dermatitis Introduction Dermatitis is redness, soreness, and swelling (inflammation) of the skin. Contact dermatitis is a reaction to certain substances that touch the skin. You either touched something that irritated your skin, or you have allergies to something you touched. Follow these instructions at home: [place] your skin as needed.  Apply cool compresses to the affected areas.  Try taking a bath with:  Epsom salts. Follow the instructions on the package. You can get these at a pharmacy or grocery store.  Baking soda. Pour a small amount into the bath as told by your doctor.  Colloidal oatmeal. Follow the instructions on the package. You can get this at a pharmacy or grocery store.  Try applying baking soda paste to your skin. Stir water into baking soda until it looks like paste.  Do not scratch your skin.  Bathe less often.  Bathe in lukewarm water. Avoid using hot water. Medicines  Take or apply over-the-counter and prescription medicines only as told by your doctor.  If you were prescribed an antibiotic medicine, take or apply your antibiotic as told by your doctor. Do not stop taking the antibiotic even if your condition starts to get better. General instructions  Keep all follow-up visits as told by your doctor. This is important.  Avoid the substance that caused your reaction. If you do not know what caused it, keep a journal to try to track what caused it. Write down:  What you eat.  What cosmetic products you use.  What you drink.  What you wear in the affected area. This includes jewelry.  If you were given a bandage (dressing), take care of it as told by your doctor. This includes when to change and remove it. Contact a  doctor if:  You do not get better with treatment.  Your condition gets worse.  You have signs of infection such as:  Swelling.  Tenderness.  Redness.  Soreness.  Warmth.  You have a fever.  You have new symptoms. Get help right away if:  You have a very bad headache.  You have neck pain.  Your neck is stiff.  You throw up (vomit).  You feel very sleepy.  You see red streaks coming from the affected area.  Your bone or joint underneath the affected area becomes painful after the skin has healed.  The affected area turns darker.  You have trouble breathing. This information is not intended to replace advice given to you by your health care provider. Make sure you discuss any questions you have with your health care provider. Document Released: 11/12/2008 Document Revised: 06/23/2015 Document Reviewed: 06/02/2014  2017 Elsevier

## 2016-01-11 ENCOUNTER — Telehealth: Payer: Self-pay | Admitting: Family Medicine

## 2016-01-11 NOTE — Telephone Encounter (Signed)
° ° ° °  Pt said she saw Almyra Free last week and was offered a dose pack and she decided not to take it at the time because of being a diabetic. Pt called today to request the dose pack because she is having a lot itching and rash .

## 2016-01-11 NOTE — Telephone Encounter (Signed)
Please send in prednisone 20mg  daily for 5 days, then half tablet for 5 days, then half a tablet every other day until finished (#9)  Dermatology referral if fails. Watch sugar closely may need to adjust dose

## 2016-01-17 ENCOUNTER — Other Ambulatory Visit: Payer: Self-pay

## 2016-01-17 MED ORDER — PREDNISONE 20 MG PO TABS: 20.0000 mg | ORAL_TABLET | Freq: Every day | ORAL | 0 refills | Status: DC

## 2016-01-17 NOTE — Telephone Encounter (Signed)
Spoke to patient who verbalized understanding.

## 2016-01-19 ENCOUNTER — Ambulatory Visit (INDEPENDENT_AMBULATORY_CARE_PROVIDER_SITE_OTHER): Payer: BC Managed Care – PPO | Admitting: Family Medicine

## 2016-01-19 ENCOUNTER — Encounter: Payer: Self-pay | Admitting: Family Medicine

## 2016-01-19 VITALS — BP 128/84 | HR 99 | Temp 97.2°F

## 2016-01-19 DIAGNOSIS — R21 Rash and other nonspecific skin eruption: Secondary | ICD-10-CM

## 2016-01-19 DIAGNOSIS — I10 Essential (primary) hypertension: Secondary | ICD-10-CM | POA: Diagnosis not present

## 2016-01-19 DIAGNOSIS — E1149 Type 2 diabetes mellitus with other diabetic neurological complication: Secondary | ICD-10-CM

## 2016-01-19 DIAGNOSIS — G894 Chronic pain syndrome: Secondary | ICD-10-CM | POA: Diagnosis not present

## 2016-01-19 MED ORDER — HYDROCODONE-ACETAMINOPHEN 5-325 MG PO TABS: 1.0000 | ORAL_TABLET | Freq: Every day | ORAL | 0 refills | Status: DC | PRN

## 2016-01-19 MED ORDER — INSULIN NPH ISOPHANE & REGULAR (70-30) 100 UNIT/ML ~~LOC~~ SUSP: SUBCUTANEOUS | 3 refills | Status: DC

## 2016-01-19 MED ORDER — PERMETHRIN 5 % EX CREA: 1.0000 "application " | TOPICAL_CREAM | Freq: Once | CUTANEOUS | 0 refills | Status: AC

## 2016-01-19 NOTE — Assessment & Plan Note (Signed)
S: controlled on Micardis 80mg , lasix 40mg -, improved from last visit.  BP Readings from Last 3 Encounters:  01/19/16 128/84  01/02/16 140/76  12/16/15 128/64  A/P:Continue current meds:  Doing well despite prednisone

## 2016-01-19 NOTE — Patient Instructions (Addendum)
See me 1 month.   Increase insulin to 35 in the morning and 33 before dinner. Let me know if any sugars under 75.   Refill vicodin  Trial elimite treatment- Thoroughly massage cream (30 g for average adult) from head to soles of feet; leave on for 8 to 14 hours before removing (shower or bath); for infants and the elderly, also apply on the hairline, neck, scalp, temple, and forehead; may repeat if living mites are observed 14 days after first treatment; one application is generally curative.   May repeat in 10 days.

## 2016-01-19 NOTE — Progress Notes (Signed)
Subjective:  [name] is a 69 y.o. year old very pleasant female patient who presents for/with See problem oriented charting ROS- intense itchy rash on legs, no chest pain. No fever or chills.    Past Medical History-  Patient Active Problem List   Diagnosis Date Noted  . Diastolic CHF ([place]) [id number]    Priority: High  . Chronic pain syndrome 04/20/2009    Priority: High  . CVA (cerebral infarction) 01/05/2008    Priority: High  . Type 2 diabetes mellitus with neurological manifestations, controlled ([place]) 11/01/2005    Priority: High  . Gout 01/04/2015    Priority: Medium  . Osteopenia 07/05/2014    Priority: Medium  . Anemia 11/24/2013    Priority: Medium  . Abnormal transaminases 11/24/2013    Priority: Medium  . CKD (chronic kidney disease), stage III 01/11/2010    Priority: Medium  . Edema 05/13/2009    Priority: Medium  . Obesity 10/23/2007    Priority: Medium  . Depression 11/01/2005    Priority: Medium  . Essential hypertension 11/01/2005    Priority: Medium  . Rosacea 11/24/2013    Priority: Low  . Shingles 11/24/2013    Priority: Low  . OA (osteoarthritis) of knee 12/28/2012    Priority: Low  . Ventral hernia 01/17/2010    Priority: Low  . GERD 04/12/2009    Priority: Low  . Actinic keratosis 08/11/2015  . Risk for falls 08/11/2015    Medications- reviewed and updated Current Outpatient Prescriptions  Medication Sig Dispense Refill  . docusate sodium (COLACE) 50 MG capsule Take 100 mg by mouth daily.     . furosemide (LASIX) 40 MG tablet TAKE 1 TABLET TWICE DAILY. 180 tablet 1  . GLIPIZIDE XL 10 MG 24 hr tablet TAKE 1 TABLET TWICE DAILY. 180 tablet 1  . glucose blood (ONETOUCH VERIO) test strip Test 4 times daily. Dx E11.9 100 each 12  . HYDROcodone-acetaminophen (NORCO/VICODIN) 5-325 MG tablet Take 1 tablet by mouth daily as needed. 90 tablet 0  . hydrocortisone (ANUSOL-HC) 2.5 % rectal cream Place 1 application rectally 2 (two) times daily as  needed for hemorrhoids or itching.    . insulin NPH-regular Human (NOVOLIN 70/30) (70-30) 100 UNIT/ML injection INJECT 31-40 UNITS OF INSULIN twice a day as instructed by physician 8 vial 3  . Insulin Syringe-Needle U-100 (BD INSULIN SYRINGE ULTRAFINE) 31G X 5/16" 0.3 ML MISC Use to test blood sugars twice daily. Dx: E11.9 100 each 11  . metFORMIN (GLUCOPHAGE) 1000 MG tablet TAKE 1 TABLET TWICE DAILY WITH FOOD. 180 tablet 6  . methocarbamol (ROBAXIN) 500 MG tablet Take 1 tablet (500 mg total) by mouth every 6 (six) hours as needed for muscle spasms. 80 tablet 0  . metroNIDAZOLE (METROCREAM) 0.75 % cream APPLY TO AFFECTED AREA TWICE A DAY. 45 g 1  . ONE TOUCH LANCETS MISC Test 4 times daily. Dx E11.9 100 each 12  . potassium chloride SA (K-DUR,KLOR-CON) 20 MEQ tablet TAKE 1 TABLET TWICE DAILY. 180 tablet 1  . predniSONE (DELTASONE) 20 MG tablet Take 1 tablet (20 mg total) by mouth daily with breakfast. Take 1 tab daily x5 days, 1/2 tab x5 days, 1/2 tab every other day 9 tablet 0  . PRILOSEC OTC 20 MG tablet TAKE 1 TABLET ONCE DAILY. 84 tablet 0  . telmisartan (MICARDIS) 80 MG tablet TAKE 1 TABLET ONCE DAILY. 90 tablet 1  . traMADol (ULTRAM) 50 MG tablet Take 1 tablet (50 mg total) by mouth  every 8 (eight) hours as needed. 30 tablet 0  . triamcinolone cream (KENALOG) 0.1 % Apply 1 application topically 2 (two) times daily. For 10 days 30 g 0  . valACYclovir (VALTREX) 1000 MG tablet Take 0.5 tablets (500 mg total) by mouth 3 (three) times daily. 21 tablet 0  . permethrin (ELIMITE) 5 % cream Apply 1 application topically once. 60 g 0   No current facility-administered medications for this visit.     Objective: BP 128/84 (BP Location: Left Arm, Patient Position: Sitting, Cuff Size: Large)   Pulse 99   Temp 97.2 F (36.2 C) (Oral)   SpO2 97%  Gen: NAD, resting comfortably CV: RRR no murmurs rubs or gallops Lungs: CTAB no crackles, wheeze, rhonchi Abdomen: morbid obesity  Ext: 1+ edema with  venous stasis changes. Multiple red very small papules on legs.  Skin: warm, dry, rash as above  Assessment/Plan:  Type 2 diabetes mellitus with neurological manifestations, controlled (HCC) S: evening CBGs in 250-300 range despite 70.30 insulin at 33 units in AM and 31 before dinner. Checks in morning range from 99-180 (but admits with 99 did not eat well night before). 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  A/P: we opted to titrate to 35 in AM and 33 in PM especially with current prednisone use. She will watch closely for lows but really want to get PM checks under 200. Difficult to get excellent control but want to be closer to 8. Continue glipizide and metformin- states of of these sugars have really shot up in past.   I advised endocrine but she states $85 copay whereas $10 to see me- so we opted for monthly visits for tighter control.   Had also planned on bmet today- she requests to postpone until next visit. Discussed importance of diabetes control to protect her kidneys  Essential hypertension S: controlled on Micardis 80mg , lasix 40mg -, improved from last visit.  BP Readings from Last 3 Encounters:  01/19/16 128/84  01/02/16 140/76  12/16/15 128/64  A/P:Continue current meds:  Doing well despite prednisone   Chronic pain syndrome S: continued pain Due to diabetic neuropathy and post herpetic neuralgia. Vicodin 1 tab per day (1/2 tab BID) gives some control.  A/P: refilled medication for 3 months. Next visit need to check on state records, controlled substance contract, UDS   Rash on legs- topical steroid cream did not help and feels like it worsened, started prednsione 2 days ago and not sure it is helping,has dermatology appointment but mid January. She asks about scabies- though I think likelihood is low we agreed to 2 treatments of elimite.   1 month  Meds ordered this encounter  Medications  . permethrin (ELIMITE) 5 % cream    Sig:  Apply 1 application topically once.    Dispense:  60 g    Refill:  0  . HYDROcodone-acetaminophen (NORCO/VICODIN) 5-325 MG tablet    Sig: Take 1 tablet by mouth daily as needed.    Dispense:  90 tablet    Refill:  0  . insulin NPH-regular Human (NOVOLIN 70/30) (70-30) 100 UNIT/ML injection    Sig: INJECT 31-40 UNITS OF INSULIN twice a day as instructed by physician    Dispense:  8 vial    Refill:  3    Return precautions advised.  Garret Reddish, MD

## 2016-01-19 NOTE — Progress Notes (Signed)
Pre visit review using our clinic review tool, if applicable. No additional management support is needed unless otherwise documented below in the visit note. 

## 2016-01-19 NOTE — Assessment & Plan Note (Signed)
S: continued pain Due to diabetic neuropathy and post herpetic neuralgia. Vicodin 1 tab per day (1/2 tab BID) gives some control.  A/P: refilled medication for 3 months. Next visit need to check on state records, controlled substance contract, UDS

## 2016-01-19 NOTE — Assessment & Plan Note (Addendum)
S: evening CBGs in 250-300 range despite 70.30 insulin at 33 units in AM and 31 before dinner. Checks in morning range from 99-180 (but admits with 99 did not eat well night before). 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  A/P: we opted to titrate to 35 in AM and 33 in PM especially with current prednisone use. She will watch closely for lows but really want to get PM checks under 200. Difficult to get excellent control but want to be closer to 8. Continue glipizide and metformin- states of of these sugars have really shot up in past.   I advised endocrine but she states $85 copay whereas $10 to see me- so we opted for monthly visits for tighter control.   Had also planned on bmet today- she requests to postpone until next visit. Discussed importance of diabetes control to protect her kidneys

## 2016-01-31 ENCOUNTER — Other Ambulatory Visit: Payer: Self-pay | Admitting: Family Medicine

## 2016-01-31 ENCOUNTER — Telehealth: Payer: Self-pay | Admitting: Family Medicine

## 2016-01-31 NOTE — Telephone Encounter (Signed)
May send in 10mg  prednisone to be taken daily for 5 days then take 1/2 tablet daily for another 5 days- that gets her closer to her dermatology visit- but would allow the rash to be flared up if it is going to flare up so dermatology can see what she is dealing with

## 2016-01-31 NOTE — Telephone Encounter (Signed)
Pt states the only reason she needed to refill of the predniSONE (DELTASONE) 20 MG tablet  Was because on the 6th day she is now better.  But once she went to 1/2 tab every other day, it started to come back.  Pt is concerned she may need a little longer on the prednisone. Pt has been doing the 1/2 tab but every day instead of EOD.  Pt has dermatologist appt on 02/15/2016.  [place]

## 2016-02-03 ENCOUNTER — Other Ambulatory Visit: Payer: Self-pay

## 2016-02-03 MED ORDER — PREDNISONE 10 MG PO TABS: 10.0000 mg | ORAL_TABLET | Freq: Every day | ORAL | 0 refills | Status: DC

## 2016-02-03 NOTE — Telephone Encounter (Signed)
Prescription sent to pharmacy. Called patient to let her know.

## 2016-02-10 ENCOUNTER — Telehealth: Payer: Self-pay | Admitting: Family Medicine

## 2016-02-10 MED ORDER — OSELTAMIVIR PHOSPHATE 75 MG PO CAPS: 75.0000 mg | ORAL_CAPSULE | Freq: Every day | ORAL | 0 refills | Status: DC

## 2016-02-10 NOTE — Telephone Encounter (Signed)
Pt son has the flu and pt would like tamiflu call into [facility]. Pt has an appt on 02-20-16. Please make sure the tamiflu will not interact with her other medications

## 2016-02-10 NOTE — Telephone Encounter (Signed)
I sent this in for patient. Please let her know

## 2016-02-10 NOTE — Telephone Encounter (Signed)
Husband went back to work at [employer] (body aches, fatigue). I advised he leave school and come be evaluated. If he declines this (putting students at risk and I was clear about this)- then be seen in Saturday clinic.   Son febrile to 102 since Wednesday- likely flu. I had assumed this was confirmed flu.   Already sent in tamiflu thinking confirmed case. She has body aches, fatigue but did have flu shot. She intendss to take BID instead of daily due to her concern fo flu  This is tough because this is likely flu but without in office evaluation hard to treat 2 family members and she has had most direct care with son- she will be treated, he will be evaluated

## 2016-02-10 NOTE — Telephone Encounter (Signed)
Spoke with patient who verbalized that she will pick up the prescription. She states her husband came home today at 12:00 and he is showing the same symptoms. She wondered if we can call T[facility] Flu in for her husband too. [name]. DOB 08/30/1944

## 2016-02-20 ENCOUNTER — Ambulatory Visit (INDEPENDENT_AMBULATORY_CARE_PROVIDER_SITE_OTHER): Payer: BC Managed Care – PPO | Admitting: Family Medicine

## 2016-02-20 ENCOUNTER — Encounter: Payer: Self-pay | Admitting: Family Medicine

## 2016-02-20 VITALS — BP 134/60 | HR 97 | Temp 98.3°F

## 2016-02-20 DIAGNOSIS — G894 Chronic pain syndrome: Secondary | ICD-10-CM | POA: Diagnosis not present

## 2016-02-20 DIAGNOSIS — R059 Cough, unspecified: Secondary | ICD-10-CM

## 2016-02-20 DIAGNOSIS — R05 Cough: Secondary | ICD-10-CM | POA: Diagnosis not present

## 2016-02-20 DIAGNOSIS — E1149 Type 2 diabetes mellitus with other diabetic neurological complication: Secondary | ICD-10-CM

## 2016-02-20 NOTE — Assessment & Plan Note (Signed)
S: patient is currently continuing on vicodin 1 tab total per day. We reviewed controlled substance contract.  A/P: filled out controlled substance contract. Will plan on UDS future visit as well as review state database at time of next refill.

## 2016-02-20 NOTE — Assessment & Plan Note (Signed)
S: compliant Novolin 70/30 35 am and 31 units PM as well as metformin 1g BID and glipizide 10mg  BID. She does not bring a full sugar log but her Am sugar today was 120, she declines lows and states highest cbg in last week was 159 which indicates drastic improvement.  A/P: improved #s, to return in 1 month for A1c, cmet, cbc diff. Hopeful a1c at least below 8 from 9.5 at that point.

## 2016-02-20 NOTE — Progress Notes (Signed)
Subjective:  [name] is a 71 y.o. year old very pleasant female patient who presents for/with See problem oriented charting ROS- continues to have some cough and chest congestion. No measured fevers. No shortness of breath   Past Medical History-  Patient Active Problem List   Diagnosis Date Noted  . Diastolic CHF ([place]) [id number]    Priority: High  . Chronic pain syndrome 04/20/2009    Priority: High  . CVA (cerebral infarction) 01/05/2008    Priority: High  . Type 2 diabetes mellitus with neurological manifestations, controlled ([place]) 11/01/2005    Priority: High  . Gout 01/04/2015    Priority: Medium  . Osteopenia 07/05/2014    Priority: Medium  . Anemia 11/24/2013    Priority: Medium  . Abnormal transaminases 11/24/2013    Priority: Medium  . CKD (chronic kidney disease), stage III 01/11/2010    Priority: Medium  . Edema 05/13/2009    Priority: Medium  . Obesity 10/23/2007    Priority: Medium  . Depression 11/01/2005    Priority: Medium  . Essential hypertension 11/01/2005    Priority: Medium  . Rosacea 11/24/2013    Priority: Low  . Shingles 11/24/2013    Priority: Low  . OA (osteoarthritis) of knee 12/28/2012    Priority: Low  . Ventral hernia 01/17/2010    Priority: Low  . GERD 04/12/2009    Priority: Low  . Actinic keratosis 08/11/2015  . Risk for falls 08/11/2015    Medications- reviewed and updated Current Outpatient Prescriptions  Medication Sig Dispense Refill  . docusate sodium (COLACE) 50 MG capsule Take 100 mg by mouth daily.     . furosemide (LASIX) 40 MG tablet TAKE 1 TABLET TWICE DAILY. 180 tablet 1  . GLIPIZIDE XL 10 MG 24 hr tablet TAKE 1 TABLET TWICE DAILY. 180 tablet 1  . glucose blood (ONETOUCH VERIO) test strip Test 4 times daily. Dx E11.9 100 each 12  . HYDROcodone-acetaminophen (NORCO/VICODIN) 5-325 MG tablet Take 1 tablet by mouth daily as needed. 90 tablet 0  . hydrocortisone (ANUSOL-HC) 2.5 % rectal cream Place 1 application  rectally 2 (two) times daily as needed for hemorrhoids or itching.    . insulin NPH-regular Human (NOVOLIN 70/30) (70-30) 100 UNIT/ML injection INJECT 31-40 UNITS OF INSULIN twice a day as instructed by physician 8 vial 3  . Insulin Syringe-Needle U-100 (BD INSULIN SYRINGE ULTRAFINE) 31G X 5/16" 0.3 ML MISC Use to test blood sugars twice daily. Dx: E11.9 100 each 11  . metFORMIN (GLUCOPHAGE) 1000 MG tablet TAKE 1 TABLET TWICE DAILY WITH FOOD. 180 tablet 6  . methocarbamol (ROBAXIN) 500 MG tablet Take 1 tablet (500 mg total) by mouth every 6 (six) hours as needed for muscle spasms. 80 tablet 0  . metroNIDAZOLE (METROCREAM) 0.75 % cream APPLY TO AFFECTED AREA TWICE A DAY. 45 g 1  . ONE TOUCH LANCETS MISC Test 4 times daily. Dx E11.9 100 each 12  . oseltamivir (TAMIFLU) 75 MG capsule Take 1 capsule (75 mg total) by mouth daily. For prophylaxis 10 capsule 0  . potassium chloride SA (K-DUR,KLOR-CON) 20 MEQ tablet TAKE 1 TABLET TWICE DAILY. 180 tablet 1  . predniSONE (DELTASONE) 10 MG tablet Take 1 tablet (10 mg total) by mouth daily. Take 1 tablet x 5 days, then 0.5 tablet x 5 days 8 tablet 0  . PRILOSEC OTC 20 MG tablet TAKE 1 TABLET ONCE DAILY. 84 tablet 0  . telmisartan (MICARDIS) 80 MG tablet TAKE 1 TABLET ONCE DAILY.  90 tablet 1  . traMADol (ULTRAM) 50 MG tablet Take 1 tablet (50 mg total) by mouth every 8 (eight) hours as needed. 30 tablet 0  . triamcinolone cream (KENALOG) 0.1 % Apply 1 application topically 2 (two) times daily. For 10 days 30 g 0  . valACYclovir (VALTREX) 1000 MG tablet Take 0.5 tablets (500 mg total) by mouth 3 (three) times daily. 21 tablet 0   No current facility-administered medications for this visit.     Objective: BP 134/60 (BP Location: Left Arm, Patient Position: Sitting, Cuff Size: Large)   Pulse 97   Temp 98.3 F (36.8 C) (Oral)   SpO2 97%  Gen: NAD, resting comfortably Oropharynx largely normal, nares mildly inflammed CV: RRR no murmurs rubs or  gallops Lungs: CTAB no crackles, wheeze, rhonchi  Ext: chronic 1+ stable edema- does not tolerate attempted pitting Skin: warm, dry  Assessment/Plan:  Type 2 diabetes mellitus with neurological manifestations, controlled ([facility]CC) S: compliant Novolin 70/30 35 am and 31 units PM as well as metformin 1g BID and glipizide 10mg  BID. She does not bring a full sugar log but her Am sugar today was 120, she declines lows and states highest cbg in last week was 159 which indicates drastic improvement.  A/P: improved #s, to return in 1 month for A1c, cmet, cbc diff. Hopeful a1c at least below 8 from 9.5 at that point.   Chronic pain syndrome S: patient is currently continuing on vicodin 1 tab total per day. We reviewed controlled substance contract.  A/P: filled out controlled substance contract. Will plan on UDS future visit as well as review state database at time of next refill.   Influenza- treated with tamiflu by phone last week after contact with son with flu. She has had drastic improvement and has not had fever in several days. She has a lingering cough and some chest congestion but lungs are clear.   1 month- needs labs at that time  Return precautions advised.  Garret Reddish, MD

## 2016-02-20 NOTE — Progress Notes (Signed)
Pre visit review using our clinic review tool, if applicable. No additional management support is needed unless otherwise documented below in the visit note. 

## 2016-02-20 NOTE — Patient Instructions (Signed)
No changes in diabetes medicine. Alert me if any lows.   [name] will help you fill out the controlled substance contract  See me in about a month. We will update labs at that time.   Lungs sound clear !

## 2016-03-07 ENCOUNTER — Other Ambulatory Visit: Payer: Self-pay | Admitting: Family Medicine

## 2016-03-12 ENCOUNTER — Telehealth: Payer: Self-pay

## 2016-03-12 NOTE — Telephone Encounter (Signed)
Spoke with pt and she states that areas have improved. She does not feel she needs to be seen immediately. She already has appt with Dr Yong Channel 03/20/16 and would like to keep that. She is aware of s/s that would indicate need for immediate medical attention. Nothing further needed at this time.     [employer] [facility] Night - Client Client Site [facility] - Night Physician Garret Reddish - MD Contact Type Call Who Is Calling Patient / Member / Family / Caregiver Caller Name Jorge Lumia Caller Phone Number [phone] Patient Name [name] Call Type Message Only Information Provided Reason for Call Request for General Office Information Initial Comment Caller states she is having cellutis in her legs. Worse on left leg and starting in right leg. It is red, tight, warm to touch, very painful. She has diabetes.

## 2016-03-20 ENCOUNTER — Ambulatory Visit (INDEPENDENT_AMBULATORY_CARE_PROVIDER_SITE_OTHER): Payer: BC Managed Care – PPO | Admitting: Family Medicine

## 2016-03-20 ENCOUNTER — Encounter: Payer: Self-pay | Admitting: Family Medicine

## 2016-03-20 VITALS — BP 134/66 | HR 95 | Temp 97.9°F | Ht 64.0 in | Wt 281.0 lb

## 2016-03-20 DIAGNOSIS — Z79899 Other long term (current) drug therapy: Secondary | ICD-10-CM

## 2016-03-20 DIAGNOSIS — M1A09X0 Idiopathic chronic gout, multiple sites, without tophus (tophi): Secondary | ICD-10-CM

## 2016-03-20 DIAGNOSIS — G8191 Hemiplegia, unspecified affecting right dominant side: Secondary | ICD-10-CM

## 2016-03-20 DIAGNOSIS — E1149 Type 2 diabetes mellitus with other diabetic neurological complication: Secondary | ICD-10-CM

## 2016-03-20 LAB — CBC WITH DIFFERENTIAL/PLATELET
BASOS ABS: 0 10*3/uL (ref 0.0–0.1)
Basophils Relative: 0.6 % (ref 0.0–3.0)
EOS ABS: 0.3 10*3/uL (ref 0.0–0.7)
Eosinophils Relative: 3.2 % (ref 0.0–5.0)
HCT: 35.9 % — ABNORMAL LOW (ref 36.0–46.0)
Hemoglobin: 11.9 g/dL — ABNORMAL LOW (ref 12.0–15.0)
Lymphocytes Relative: 31.6 % (ref 12.0–46.0)
Lymphs Abs: 2.7 10*3/uL (ref 0.7–4.0)
MCHC: 33.1 g/dL (ref 30.0–36.0)
MCV: 85.9 fl (ref 78.0–100.0)
MONO ABS: 0.6 10*3/uL (ref 0.1–1.0)
MONOS PCT: 7.6 % (ref 3.0–12.0)
NEUTROS PCT: 57 % (ref 43.0–77.0)
Neutro Abs: 4.8 10*3/uL (ref 1.4–7.7)
PLATELETS: 322 10*3/uL (ref 150.0–400.0)
RBC: 4.17 Mil/uL (ref 3.87–5.11)
RDW: 16.9 % — ABNORMAL HIGH (ref 11.5–15.5)
WBC: 8.5 10*3/uL (ref 4.0–10.5)

## 2016-03-20 LAB — COMPREHENSIVE METABOLIC PANEL
ALT: 39 U/L — ABNORMAL HIGH (ref 0–35)
AST: 41 U/L — ABNORMAL HIGH (ref 0–37)
Albumin: 4 g/dL (ref 3.5–5.2)
Alkaline Phosphatase: 75 U/L (ref 39–117)
BUN: 19 mg/dL (ref 6–23)
CHLORIDE: 99 meq/L (ref 96–112)
CO2: 29 meq/L (ref 19–32)
Calcium: 9.8 mg/dL (ref 8.4–10.5)
Creatinine, Ser: 0.97 mg/dL (ref 0.40–1.20)
GFR: 60.46 mL/min (ref >60.00)
GLUCOSE: 306 mg/dL — AB (ref 70–99)
POTASSIUM: 4.2 meq/L (ref 3.5–5.1)
Sodium: 134 mEq/L — ABNORMAL LOW (ref 135–145)
Total Bilirubin: 0.7 mg/dL (ref 0.2–1.2)
Total Protein: 7.2 g/dL (ref 6.0–8.3)

## 2016-03-20 LAB — HEMOGLOBIN A1C: Hgb A1c MFr Bld: 8.9 % — ABNORMAL HIGH (ref 4.6–6.5)

## 2016-03-20 MED ORDER — COLCHICINE 0.6 MG PO TABS
0.6000 mg | ORAL_TABLET | Freq: Every day | ORAL | 3 refills | Status: DC | PRN
Start: 2016-03-20 — End: 2016-03-22

## 2016-03-20 NOTE — Assessment & Plan Note (Signed)
S: almost once a month taking colchicine for few days for ankle A/P: discussed option to control uric acid- she prefers to just use prn- may cause deposition in joints- will have to monitor. Refilled colchicine

## 2016-03-20 NOTE — Progress Notes (Signed)
Subjective:  [name] is a 71 y.o. year old very pleasant female patient who presents for/with See problem oriented charting ROS- chronic shortness of breath at baseline- likely deconditioning. No chest pain. Continued pain from prior shingles.  Some weakness on right side  Past Medical History-  Patient Active Problem List   Diagnosis Date Noted  . Diastolic CHF ([place]) [id number]    Priority: High  . Chronic pain syndrome 04/20/2009    Priority: High  . CVA (cerebral infarction) 01/05/2008    Priority: High  . Type 2 diabetes mellitus with neurological manifestations, controlled ([place]) 11/01/2005    Priority: High  . Gout 01/04/2015    Priority: Medium  . Osteopenia 07/05/2014    Priority: Medium  . Anemia 11/24/2013    Priority: Medium  . Abnormal transaminases 11/24/2013    Priority: Medium  . CKD (chronic kidney disease), stage III 01/11/2010    Priority: Medium  . Edema 05/13/2009    Priority: Medium  . Obesity 10/23/2007    Priority: Medium  . Depression 11/01/2005    Priority: Medium  . Essential hypertension 11/01/2005    Priority: Medium  . Rosacea 11/24/2013    Priority: Low  . Shingles 11/24/2013    Priority: Low  . OA (osteoarthritis) of knee 12/28/2012    Priority: Low  . Ventral hernia 01/17/2010    Priority: Low  . GERD 04/12/2009    Priority: Low  . Actinic keratosis 08/11/2015  . Risk for falls 08/11/2015    Medications- reviewed and updated Current Outpatient Prescriptions  Medication Sig Dispense Refill  . BD INSULIN SYRINGE ULTRAFINE 31G X 5/16" 0.3 ML MISC USE AS DIRECTED TWICE DAILY. 100 each 0  . docusate sodium (COLACE) 50 MG capsule Take 100 mg by mouth daily.     . furosemide (LASIX) 40 MG tablet TAKE 1 TABLET TWICE DAILY. 180 tablet 1  . GLIPIZIDE XL 10 MG 24 hr tablet TAKE 1 TABLET TWICE DAILY. 180 tablet 1  . glucose blood (ONETOUCH VERIO) test strip Test 4 times daily. Dx E11.9 100 each 12  . HYDROcodone-acetaminophen  (NORCO/VICODIN) 5-325 MG tablet Take 1 tablet by mouth daily as needed. 90 tablet 0  . hydrocortisone (ANUSOL-HC) 2.5 % rectal cream Place 1 application rectally 2 (two) times daily as needed for hemorrhoids or itching.    . insulin NPH-regular Human (NOVOLIN 70/30) (70-30) 100 UNIT/ML injection INJECT 31-40 UNITS OF INSULIN twice a day as instructed by physician 8 vial 3  . metFORMIN (GLUCOPHAGE) 1000 MG tablet TAKE 1 TABLET TWICE DAILY WITH FOOD. 180 tablet 6  . methocarbamol (ROBAXIN) 500 MG tablet Take 1 tablet (500 mg total) by mouth every 6 (six) hours as needed for muscle spasms. 80 tablet 0  . metroNIDAZOLE (METROCREAM) 0.75 % cream APPLY TO AFFECTED AREA TWICE A DAY. 45 g 1  . ONE TOUCH LANCETS MISC Test 4 times daily. Dx E11.9 100 each 12  . potassium chloride SA (K-DUR,KLOR-CON) 20 MEQ tablet TAKE 1 TABLET TWICE DAILY. 180 tablet 1  . predniSONE (DELTASONE) 10 MG tablet Take 1 tablet (10 mg total) by mouth daily. Take 1 tablet x 5 days, then 0.5 tablet x 5 days 8 tablet 0  . PRILOSEC OTC 20 MG tablet TAKE 1 TABLET ONCE DAILY. 84 tablet 0  . telmisartan (MICARDIS) 80 MG tablet TAKE 1 TABLET ONCE DAILY. 90 tablet 1  . traMADol (ULTRAM) 50 MG tablet Take 1 tablet (50 mg total) by mouth every 8 (eight) hours  as needed. 30 tablet 0  . triamcinolone cream (KENALOG) 0.1 % Apply 1 application topically 2 (two) times daily. For 10 days 30 g 0  . valACYclovir (VALTREX) 1000 MG tablet Take 0.5 tablets (500 mg total) by mouth 3 (three) times daily. 21 tablet 0  . colchicine 0.6 MG tablet Take 1 tablet (0.6 mg total) by mouth daily as needed. 30 tablet 3   No current facility-administered medications for this visit.     Objective: BP 134/66 (BP Location: Left Arm, Patient Position: Sitting, Cuff Size: Large)   Pulse 95   Temp 97.9 F (36.6 C) (Oral)   Ht 5\' 4"  (1.626 m)   Wt 281 lb (127.5 kg)   SpO2 96%   BMI 48.23 kg/m  Gen: NAD, resting comfortably CV: RRR no murmurs rubs or  gallops Lungs: CTAB no crackles, wheeze, rhonchi Morbid obesity  Ext: no edema Skin: warm, dry Neuro: walks with cane, very mild wekaness on right  Assessment/Plan:  Type 2 diabetes mellitus with neurological manifestations, controlled (HCC) S:novolin 70/30 35 AM and 31 PM- also takes metformin 1g BID and glipizide 10mg  BID.  CBGs- highest she has seen has been 250. Mornings 100-150 range or so. Has not lost weight.  A/P: hoping for a1c below 8- update today along with cbc diff, cmp.   High stress with mother in law potentially coming into her home after rehab from falls/injury  Gout S: almost once a month taking colchicine for few days for ankle A/P: discussed option to control uric acid- she prefers to just use prn- may cause deposition in joints- will have to monitor. 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 Mild weakness throughout right side.   Follow up depending on labs. a1c over 8- 1 month with adjustments likely in meds  If under 8- next chronic narcotic refill  Orders Placed This Encounter  Procedures  . Comprehensive metabolic panel    [place]  . Hemoglobin A1c    [place]  . CBC with Differential/Platelet  . Drug Abuse Panel 10-50, U    Meds ordered this encounter  Medications  . colchicine 0.6 MG tablet    Sig: Take 1 tablet (0.6 mg total) by mouth daily as needed.    Dispense:  30 tablet    Refill:  3    Return precautions advised.  Garret Reddish, MD

## 2016-03-20 NOTE — Assessment & Plan Note (Addendum)
S:novolin 70/30 35 AM and 31 PM- also takes metformin 1g BID and glipizide 10mg  BID.  CBGs- highest she has seen has been 250. Mornings 100-150 range or so. Has not lost weight.  A/P: hoping for a1c below 8- update today along with cbc diff, cmp.   High stress with mother in law potentially coming into her home after rehab from falls/injury

## 2016-03-20 NOTE — Assessment & Plan Note (Signed)
After cva. Mild weakness throughout right side.

## 2016-03-20 NOTE — Patient Instructions (Addendum)
Please stop by lab before you go  No changes in meds except based on labwork  If a1c over 8 lets follow up 1 month  If under 80 - next time you need pain medicine

## 2016-03-20 NOTE — Progress Notes (Signed)
Pre visit review using our clinic review tool, if applicable. No additional management support is needed unless otherwise documented below in the visit note. 

## 2016-03-21 LAB — DRUG ABUSE PANEL 10-50, U
AMPHETAMINES (1000 NG/ML SCRN): NEGATIVE
BARBITURATES: NEGATIVE
BENZODIAZEPINES: NEGATIVE
COCAINE METABOLITES: NEGATIVE
MARIJUANA MET (50 ng/mL SCRN): NEGATIVE
METHADONE: NEGATIVE
METHAQUALONE: NEGATIVE
OPIATES: NEGATIVE
PHENCYCLIDINE: NEGATIVE
PROPOXYPHENE: NEGATIVE

## 2016-03-22 ENCOUNTER — Other Ambulatory Visit: Payer: Self-pay

## 2016-03-22 MED ORDER — COLCHICINE 0.6 MG PO TABS: 0.6000 mg | ORAL_TABLET | Freq: Every day | ORAL | 1 refills | Status: DC | PRN

## 2016-04-05 ENCOUNTER — Other Ambulatory Visit: Payer: Self-pay | Admitting: Family Medicine

## 2016-04-18 ENCOUNTER — Other Ambulatory Visit: Payer: Self-pay | Admitting: Family Medicine

## 2016-04-19 ENCOUNTER — Other Ambulatory Visit: Payer: Self-pay | Admitting: Family Medicine

## 2016-04-19 ENCOUNTER — Ambulatory Visit: Payer: BC Managed Care – PPO | Admitting: Family Medicine

## 2016-04-23 ENCOUNTER — Telehealth: Payer: Self-pay | Admitting: Cardiology

## 2016-04-23 NOTE — Telephone Encounter (Signed)
Pt calling in with complaints of on-going DOE and intermittent chest pain that radiates into her left arm and jaw area.   Pt reports symptoms have been going on for a month now.  Pt states she is not experiencing any cardiac complaints at this current time.  Pt states its been on and off for about a month now, and she is starting to get concerned.  Pt would like to make an appt with Dr Meda Coffee for further assessment of issue.  Scheduled the pt to see Dr Meda Coffee on tomorrow 3/27 at 2 pm.  Pt aware to arrive 15 mins early.  Advised the pt that if she starts to develop symptoms, or worsening symptoms between now and tomorrow, then she should seek immediate medical attention.  Pt verbalized understanding and agrees with this plan.  Pt states she feels fine at this current time.  Will route this message to Dr Meda Coffee as an Juluis Rainier.

## 2016-04-23 NOTE — Telephone Encounter (Signed)
New Message   Pt has appt on 05/16/16 with Estella Husk   Pt c/o Shortness Of Breath: STAT if SOB developed within the last 24 hours or pt is noticeably SOB on the phone  1. Are you currently SOB (can you hear that pt is SOB on the phone)? no  2. How long have you been experiencing SOB? 6 weeks  3. Are you SOB when sitting or when up moving around? With exertion  4. Are you currently experiencing any other symptoms?  Pain in rt arm and shoulder and into her jaw

## 2016-04-24 ENCOUNTER — Encounter (INDEPENDENT_AMBULATORY_CARE_PROVIDER_SITE_OTHER): Payer: Self-pay

## 2016-04-24 ENCOUNTER — Ambulatory Visit (INDEPENDENT_AMBULATORY_CARE_PROVIDER_SITE_OTHER): Payer: BC Managed Care – PPO | Admitting: Cardiology

## 2016-04-24 VITALS — BP 124/74 | HR 94 | Ht 64.0 in | Wt 281.0 lb

## 2016-04-24 DIAGNOSIS — I5033 Acute on chronic diastolic (congestive) heart failure: Secondary | ICD-10-CM

## 2016-04-24 DIAGNOSIS — R0609 Other forms of dyspnea: Secondary | ICD-10-CM | POA: Diagnosis not present

## 2016-04-24 DIAGNOSIS — F1190 Opioid use, unspecified, uncomplicated: Secondary | ICD-10-CM | POA: Diagnosis not present

## 2016-04-24 DIAGNOSIS — R600 Localized edema: Secondary | ICD-10-CM | POA: Diagnosis not present

## 2016-04-24 DIAGNOSIS — R0602 Shortness of breath: Secondary | ICD-10-CM

## 2016-04-24 DIAGNOSIS — I10 Essential (primary) hypertension: Secondary | ICD-10-CM

## 2016-04-24 NOTE — Patient Instructions (Signed)
Medication Instructions:   TAKE LASIX 80 MG (2 TABLETS) AT 8 AM AND 40 MG (1 TABLET) AT 2 PM FOR ONE WEEK ONLY, THEN TAKE LASIX 40 MG TWICE DAILY THEREAFTER.    Testing/Procedures:  Your physician has requested that you have a lexiscan myoview. For further information please visit [email or web address]. Please follow instruction sheet, as given.    Follow-Up:  3 WEEKS WITH AN EXTENDER IN OUR OFFICE       If you need a refill on your cardiac medications before your next appointment, please call your pharmacy.

## 2016-04-24 NOTE — Progress Notes (Signed)
Cardiology Office Note    Date:  04/24/2016   ID:  [name], DOB Aug 19, 1945, MRN [id number]  PCP:  Garret Reddish, MD  Cardiologist:   Ena Dawley, MD   Chief complain: LE edema, DOE  History of Present Illness:  [name] is a 71 y.o. female with h/o DM, hypertension, hyperlipidemia who I have seen in 2014 for preoperative evaluation prior to right knee replacement. The patient was cleared at the time and the surgery was uneventful. At the time the order an echocardiogram that showed LVEF 40% grade 1 diastolic dysfunction and mildly dilated left atrial dilatation. The patient has a long history of complications, she had multiorgan failure as reaction to anesthesia with her left knee replacement in 2010. She also states that she had history of cardiac arrest after getting iodine contrast. The patient is coming with worsening lower extremity edema that she has noticed in the couple of months, denies PND or orthopnea, she also states that she always feels tired and short of breath with minimal activity. She is minimally active doesn't exercise or walk. She states that her activity is walking to the bathroom. She is on narcotic contract for chronic pain. She denies any chest pain but complains of dyspnea on minimal exertion with some chest pressure. Denies any dizziness palpitation or syncope.   Past Medical History:  Diagnosis Date  . Arthritis   . Complication of anesthesia    after surgery: renal failure, hypotension, tachycardia, shock  . Complication of joint prosthesis (HCC)    locks up and no PT after knee replacement  . CVA (cerebral vascular accident) [facility]) 2008   "small hemmoragic" right sided face drooping, no residual or follow up, can not confirmed because of MRI, questionable  . Depression    very long time ago  . Diabetes mellitus   . GERD (gastroesophageal reflux disease)    mild, occasional  . H/O bronchitis   . H/O cardiac arrest    from contrast dye    . H/O hemorrhoids   . H/O measles   . H/O mumps   . H/O rubella   . Hypertension   . Lower extremity edema   . Lumbar pain   . Osteoarthritis   . Peripheral neuropathy (HCC)    below ankle in both feet  . Pneumonia    h/o, vaccine given  . Renal failure    "after left knee replacement, fine now" some renal damage  . Umbilical hernia    "huge"    Past Surgical History:  Procedure Laterality Date  . ABDOMINAL HYSTERECTOMY  1993   abdominal, left ovaries  . GASTRIC BYPASS  before 2006   at [facility]  . INCONTINENCE SURGERY  May 2006   SPARK, cystocelle, rectocelle  . KNEE ARTHROSCOPY     bilateral  . REPLACEMENT TOTAL KNEE Left Oct 2006  . TONSILLECTOMY  age 67  . TOTAL KNEE ARTHROPLASTY Right 01/19/2013   Procedure: RIGHT TOTAL KNEE ARTHROPLASTY;  Surgeon: Gearlean Alf, MD;  Location: [facility];  Service: Orthopedics;  Laterality: Right;    Current Medications: Outpatient Medications Prior to Visit  Medication Sig Dispense Refill  . BD INSULIN SYRINGE ULTRAFINE 31G X 5/16" 0.3 ML MISC USE AS DIRECTED TWICE DAILY. 100 each 0  . colchicine 0.6 MG tablet Take 1 tablet (0.6 mg total) by mouth daily as needed. 90 tablet 1  . docusate sodium (COLACE) 50 MG capsule Take 100 mg by mouth daily.     Marland Kitchen  furosemide (LASIX) 40 MG tablet TAKE 1 TABLET TWICE DAILY. 180 tablet 0  . GLIPIZIDE XL 10 MG 24 hr tablet TAKE 1 TABLET TWICE DAILY. 180 tablet 1  . glucose blood (ONETOUCH VERIO) test strip Test 4 times daily. Dx E11.9 100 each 12  . HYDROcodone-acetaminophen (NORCO/VICODIN) 5-325 MG tablet Take 1 tablet by mouth daily as needed. 90 tablet 0  . hydrocortisone (ANUSOL-HC) 2.5 % rectal cream Place 1 application rectally 2 (two) times daily as needed for hemorrhoids or itching.    . insulin NPH-regular Human (NOVOLIN 70/30) (70-30) 100 UNIT/ML injection INJECT 31-40 UNITS OF INSULIN twice a day as instructed by physician 8 vial 3  . metFORMIN (GLUCOPHAGE) 1000 MG tablet TAKE 1 TABLET TWICE  DAILY WITH FOOD. 180 tablet 1  . methocarbamol (ROBAXIN) 500 MG tablet Take 1 tablet (500 mg total) by mouth every 6 (six) hours as needed for muscle spasms. 80 tablet 0  . metroNIDAZOLE (METROCREAM) 0.75 % cream APPLY TO AFFECTED AREA TWICE A DAY. 45 g 1  . ONE TOUCH LANCETS MISC Test 4 times daily. Dx E11.9 100 each 12  . potassium chloride SA (K-DUR,KLOR-CON) 20 MEQ tablet TAKE 1 TABLET TWICE DAILY. 180 tablet 0  . predniSONE (DELTASONE) 10 MG tablet Take 1 tablet (10 mg total) by mouth daily. Take 1 tablet x 5 days, then 0.5 tablet x 5 days 8 tablet 0  . PRILOSEC OTC 20 MG tablet TAKE 1 TABLET ONCE DAILY. 84 tablet 0  . telmisartan (MICARDIS) 80 MG tablet TAKE 1 TABLET ONCE DAILY. 90 tablet 0  . traMADol (ULTRAM) 50 MG tablet Take 1 tablet (50 mg total) by mouth every 8 (eight) hours as needed. 30 tablet 0  . triamcinolone cream (KENALOG) 0.1 % Apply 1 application topically 2 (two) times daily. For 10 days 30 g 0  . valACYclovir (VALTREX) 1000 MG tablet Take 0.5 tablets (500 mg total) by mouth 3 (three) times daily. 21 tablet 0   No facility-administered medications prior to visit.      Allergies:   Ibuprofen; Iohexol; and Nsaids   Social History   Social History  . Marital status: Married    Spouse name: N/A  . Number of children: N/A  . Years of education: N/A   Social History Main Topics  . Smoking status: Never Smoker  . Smokeless tobacco: Never Used  . Alcohol use No  . Drug use: No  . Sexual activity: Not on file   Other Topics Concern  . Not on file   Social History Narrative   Married (husband Cristie Hem in [place] practice) [phone].  3 kids. 4 grandkids.       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      Family History:  The patient's family history is not on file.   ROS:   Please see the history of present illness.    ROS All other systems reviewed and are  negative.   PHYSICAL EXAM:   VS:  BP 124/74   Pulse 94   Ht 5\' 4"  (1.626 m)   Wt 281 lb (127.5 kg)   SpO2 98%   BMI 48.23 kg/m    GEN: Well nourished, morbidly obese, in no acute distress  HEENT: normal  Neck: no JVD, carotid bruits, or masses Cardiac: RRR; no murmurs, rubs, or gallops, B/L LE edema up to half calves. Respiratory:  clear to auscultation  bilaterally, normal work of breathing GI: soft, nontender, nondistended, + BS MS: no deformity or atrophy  Skin: warm and dry, no rash Neuro:  Alert and Oriented x 3, Strength and sensation are intact Psych: euthymic mood, full affect  Wt Readings from Last 3 Encounters:  04/24/16 281 lb (127.5 kg)  03/20/16 281 lb (127.5 kg)  12/16/15 277 lb 9.6 oz (125.9 kg)    Studies/Labs Reviewed:   EKG:  EKG is ordered today and personally reviewed.  The ekg ordered today demonstrates NSR , low voltage ECG, no change from prior.  Recent Labs: 03/20/2016: ALT 39; BUN 19; Creatinine, Ser 0.97; Hemoglobin 11.9; Platelets 322.0; Potassium 4.2; Sodium 134   Lipid Panel    Component Value Date/Time   CHOL 78 05/25/2013 0959   TRIG 64.0 05/25/2013 0959   HDL 34.60 (L) 05/25/2013 0959   CHOLHDL 2 05/25/2013 0959   VLDL 12.8 05/25/2013 0959   LDLCALC 31 05/25/2013 0959   LDLDIRECT 41.0 07/13/2015 1106   Additional studies/ records that were reviewed today include:   TTE: 11/2015 - Left ventricle: The cavity size was normal. There was mild concentric hypertrophy. Systolic function was normal. The estimated ejection fraction was in the range of 60% to 65%. Wall motion was normal; there were no regional wall motion abnormalities. Doppler parameters are consistent with abnormal left ventricular relaxation (grade 1 diastolic dysfunction). - Left atrium: The atrium was mildly dilated. - Atrial septum: No defect or patent foramen ovale was identified.   ASSESSMENT:    1. DOE (dyspnea on exertion)   2. Essential  hypertension   3. Acute on chronic diastolic CHF (congestive heart failure) ([place])   4. SOB (shortness of breath)    PLAN:  In order of problems listed above:  1. Acute on chronic diastolic CHF - we will increase lasix to 80 mg po in the morning and 40 mg po in the afternoon x 1 week, then follow with Lasix 40 mg po BID. We will follow her in 3 weeks with BMP and BNP at that time. Her lungs are clear, she is sedentary with morbid obesity (BMI = 49), most of her swelling is most probably sec to obesity itsself, lymphedema resulting from it and physical inactivity.  2. Dyspnea on minimal exertion with chest pressure - the patient is tall for stress testing, origin and she refuses because she had bad experience with use of adenosine in the past but she is explained that currently we use regadenoson with significantly improved side effects. She agrees to it but then get upset at the checkout because of her weight she would have to undergo today testing and she doesn't want to do that. She was also offered cardiac catheterization but that includes iodine contrast, she is explained that we can just premedication but she doesn't want to do that. 3. Her blood pressures currently controlled. 4. Hyperlipidemia - the patient is explained that based on current guidelines any patient, 54 of age with diabetes mellitus is recommended to be on moderate to high dose of high potency statins. The patient is absolutely adamant that she doesn't want to use that as she was previously told that her LDL went down to 30s and she was told it's dangerous to her. She is explained that that just a very good response to cholesterol medication and lowered it better based on most recent guidelines however she is absolutely positive that she doesn't want to use any statins ever again. 5. Morbid obesity - patient  needs to start regular exercise if you rule out ischemia. She is minimally active. 6. Chronic pain - on narcotic  contract  Based on patient's refusal for any proposed therapy I am doubtful that I can provide any further guidance for this patient and she might want to seek second opinion with other provider.  Medication Adjustments/Labs and Tests Ordered: Current medicines are reviewed at length with the patient today.  Concerns regarding medicines are outlined above.  Medication changes, Labs and Tests ordered today are listed in the Patient Instructions below. Patient Instructions  Medication Instructions:   TAKE LASIX 80 MG (2 TABLETS) AT 8 AM AND 40 MG (1 TABLET) AT 2 PM FOR ONE WEEK ONLY, THEN TAKE LASIX 40 MG TWICE DAILY THEREAFTER.    Testing/Procedures:  Your physician has requested that you have a lexiscan myoview. For further information please visit [email or web address]. Please follow instruction sheet, as given.    Follow-Up:  3 WEEKS WITH AN EXTENDER IN OUR OFFICE       If you need a refill on your cardiac medications before your next appointment, please call your pharmacy.      Signed, Ena Dawley, MD  04/24/2016 5:33 PM    [address], [place], [place]  [address] Phone: [phone]; Fax: [phone]

## 2016-05-10 ENCOUNTER — Ambulatory Visit (INDEPENDENT_AMBULATORY_CARE_PROVIDER_SITE_OTHER): Payer: BC Managed Care – PPO | Admitting: Family Medicine

## 2016-05-10 ENCOUNTER — Encounter: Payer: Self-pay | Admitting: Family Medicine

## 2016-05-10 ENCOUNTER — Other Ambulatory Visit: Payer: Self-pay

## 2016-05-10 VITALS — BP 136/66 | HR 94 | Temp 98.1°F | Ht 64.0 in | Wt 282.0 lb

## 2016-05-10 DIAGNOSIS — E1159 Type 2 diabetes mellitus with other circulatory complications: Secondary | ICD-10-CM | POA: Diagnosis not present

## 2016-05-10 DIAGNOSIS — G894 Chronic pain syndrome: Secondary | ICD-10-CM | POA: Diagnosis not present

## 2016-05-10 DIAGNOSIS — I5032 Chronic diastolic (congestive) heart failure: Secondary | ICD-10-CM | POA: Diagnosis not present

## 2016-05-10 DIAGNOSIS — E1149 Type 2 diabetes mellitus with other diabetic neurological complication: Secondary | ICD-10-CM | POA: Diagnosis not present

## 2016-05-10 DIAGNOSIS — R748 Abnormal levels of other serum enzymes: Secondary | ICD-10-CM | POA: Diagnosis not present

## 2016-05-10 MED ORDER — TRAMADOL HCL 50 MG PO TABS: 50.0000 mg | ORAL_TABLET | Freq: Three times a day (TID) | ORAL | 0 refills | Status: DC | PRN

## 2016-05-10 MED ORDER — "INSULIN SYRINGE-NEEDLE U-100 31G X 5/16"" 0.5 ML MISC": 3 refills | Status: AC

## 2016-05-10 MED ORDER — HYDROCODONE-ACETAMINOPHEN 5-325 MG PO TABS: 1.0000 | ORAL_TABLET | Freq: Every day | ORAL | 0 refills | Status: DC | PRN

## 2016-05-10 NOTE — Progress Notes (Signed)
Subjective:  [name] is a 71 y.o. year old very pleasant female patient who presents for/with See problem oriented charting ROS- admits to hyperglycemia but no hypoglycemia. Prior shortness of breath resolved with increased lasix. Denies chest pain   Past Medical History-  Patient Active Problem List   Diagnosis Date Noted  . Diastolic CHF ([place]) 17/40/8144    Priority: High  . Chronic pain syndrome 04/20/2009    Priority: High  . CVA (cerebral infarction) 01/05/2008    Priority: High  . Type 2 diabetes mellitus with neurological manifestations, controlled ([place]) 11/01/2005    Priority: High  . Gout 01/04/2015    Priority: Medium  . Osteopenia 07/05/2014    Priority: Medium  . Anemia 11/24/2013    Priority: Medium  . Abnormal transaminases 11/24/2013    Priority: Medium  . CKD (chronic kidney disease), stage III 01/11/2010    Priority: Medium  . Edema 05/13/2009    Priority: Medium  . Obesity 10/23/2007    Priority: Medium  . Depression 11/01/2005    Priority: Medium  . Essential hypertension 11/01/2005    Priority: Medium  . Right hemiplegia ([place]) 03/20/2016    Priority: Low  . Rosacea 11/24/2013    Priority: Low  . Shingles 11/24/2013    Priority: Low  . OA (osteoarthritis) of knee 12/28/2012    Priority: Low  . Ventral hernia 01/17/2010    Priority: Low  . GERD 04/12/2009    Priority: Low  . SOB (shortness of breath) 04/24/2016  . DOE (dyspnea on exertion) 04/24/2016  . Acute on chronic diastolic CHF (congestive heart failure) ([place]) 04/24/2016  . Actinic keratosis 08/11/2015  . Risk for falls 08/11/2015    Medications- reviewed and updated Current Outpatient Prescriptions  Medication Sig Dispense Refill  . colchicine 0.6 MG tablet Take 1 tablet (0.6 mg total) by mouth daily as needed. 90 tablet 1  . docusate sodium (COLACE) 50 MG capsule Take 100 mg by mouth daily.     . furosemide (LASIX) 40 MG tablet TAKE 1 TABLET TWICE DAILY. 180 tablet 0  .  GLIPIZIDE XL 10 MG 24 hr tablet TAKE 1 TABLET TWICE DAILY. 180 tablet 1  . glucose blood (ONETOUCH VERIO) test strip Test 4 times daily. Dx E11.9 100 each 12  . HYDROcodone-acetaminophen (NORCO/VICODIN) 5-325 MG tablet Take 1 tablet by mouth daily as needed. 90 tablet 0  . hydrocortisone (ANUSOL-HC) 2.5 % rectal cream Place 1 application rectally 2 (two) times daily as needed for hemorrhoids or itching.    . insulin NPH-regular Human (NOVOLIN 70/30) (70-30) 100 UNIT/ML injection INJECT 31-40 UNITS OF INSULIN twice a day as instructed by physician 8 vial 3  . metFORMIN (GLUCOPHAGE) 1000 MG tablet TAKE 1 TABLET TWICE DAILY WITH FOOD. 180 tablet 1  . methocarbamol (ROBAXIN) 500 MG tablet Take 1 tablet (500 mg total) by mouth every 6 (six) hours as needed for muscle spasms. 80 tablet 0  . metroNIDAZOLE (METROCREAM) 0.75 % cream APPLY TO AFFECTED AREA TWICE A DAY. 45 g 1  . ONE TOUCH LANCETS MISC Test 4 times daily. Dx E11.9 100 each 12  . potassium chloride SA (K-DUR,KLOR-CON) 20 MEQ tablet TAKE 1 TABLET TWICE DAILY. 180 tablet 0  . predniSONE (DELTASONE) 10 MG tablet Take 1 tablet (10 mg total) by mouth daily. Take 1 tablet x 5 days, then 0.5 tablet x 5 days 8 tablet 0  . PRILOSEC OTC 20 MG tablet TAKE 1 TABLET ONCE DAILY. 84 tablet 0  .  telmisartan (MICARDIS) 80 MG tablet TAKE 1 TABLET ONCE DAILY. 90 tablet 0  . traMADol (ULTRAM) 50 MG tablet Take 1 tablet (50 mg total) by mouth every 8 (eight) hours as needed. 30 tablet 0  . triamcinolone cream (KENALOG) 0.1 % Apply 1 application topically 2 (two) times daily. For 10 days 30 g 0  . valACYclovir (VALTREX) 1000 MG tablet Take 0.5 tablets (500 mg total) by mouth 3 (three) times daily. 21 tablet 0  . Insulin Syringe-Needle U-100 (B-D INS SYRINGE 0.5CC/31GX5/16) 31G X 5/16" 0.5 ML MISC Inject insulin twice a day. Patient requests syringe that can use up to 50 units. E11.59 180 each 3   No current facility-administered medications for this visit.      Objective: BP 136/66 (BP Location: Left Arm, Patient Position: Sitting, Cuff Size: Large)   Pulse 94   Temp 98.1 F (36.7 C) (Oral)   Ht 5\' 4"  (1.626 m)   Wt 282 lb (127.9 kg)   SpO2 96%   BMI 48.41 kg/m  Gen: NAD, resting comfortably CV: RRR no murmurs rubs or gallops Lungs: CTAB no crackles, wheeze, rhonchi Abdomen: morbid obesity Ext: trace edema Skin: warm, dry, no rash  Assessment/Plan:  Type 2 diabetes mellitus with neurological manifestations, controlled ([place]) S: poorly controlled. On 70/30 insulin- increased morning insulin to 36 and nighttime to 33 units CBGs- no sugars below 100. Most in 200s. None over 300.  Exercise and diet- wants to try low protein drinks which are low carb. Going to start back at the pool.  Lab Results  Component Value Date   HGBA1C 8.9 (H) 03/20/2016   HGBA1C 9.5 (H) 12/16/2015   HGBA1C 8.5 (H) 07/13/2015   A/P: increase insulin 38 units in morning and 34 units in evening. Let me know any sugars below 80. Also with stroke history- cardiovascular/vascular complications- we discussed importance of improving control.   Chronic pain syndrome S: she has been out for a short period of the vicodin- uses half tab BID- due to diabetic neuropathy and post herpetic neuralgia. trmadol has helped in past if rough afternoon A/P: refilled vicodin. UDS within a year. NCCSRS reviewed and no concerns.   Diastolic CHF ([place]) S: had developed worsening CHF and increase of lasix under cardiology care- feels much bette-r edema now, no shortness of breath with baseline activity- back to lasix 40mg  BID at this point. She declined stress testing advised by cardiology due to prior reaction A/P: I encouraged patient to follow through with cardiology advised stress test- she declines for now. Aware if worsening symptoms to seek care immediatelyj   Abnormal transaminases S: suspect fatty liver- liver funcion tests up some.  Lab Results  Component Value Date   ALT  39 (H) 03/20/2016   AST 41 (H) 03/20/2016   ALKPHOS 75 03/20/2016   BILITOT 0.7 03/20/2016  A/P: advised weight loss   Return in about 8 weeks (around 07/05/2016) for follow up- or sooner if needed. 2 month follow up- reports motivation for weight loss- but prior history has not shown ability to make progress in this area  Meds ordered this encounter  Medications  . Insulin Syringe-Needle U-100 (B-D INS SYRINGE 0.5CC/31GX5/16) 31G X 5/16" 0.5 ML MISC    Sig: Inject insulin twice a day. Patient requests syringe that can use up to 50 units. E11.59    Dispense:  180 each    Refill:  3  . HYDROcodone-acetaminophen (NORCO/VICODIN) 5-325 MG tablet    Sig: Take 1 tablet by  mouth daily as needed.    Dispense:  90 tablet    Refill:  0  . traMADol (ULTRAM) 50 MG tablet    Sig: Take 1 tablet (50 mg total) by mouth every 8 (eight) hours as needed.    Dispense:  30 tablet    Refill:  0    Return precautions advised.  Garret Reddish, MD

## 2016-05-10 NOTE — Patient Instructions (Addendum)
increase insulin 38 units in morning and 34 units in evening. Let me know any sugars below 80  Lab Results  Component Value Date   ALT 39 (H) 03/20/2016   AST 41 (H) 03/20/2016   ALKPHOS 75 03/20/2016   BILITOT 0.7 03/20/2016   Wt Readings from Last 3 Encounters:  05/10/16 282 lb (127.9 kg)  04/24/16 281 lb (127.5 kg)  03/20/16 281 lb (127.5 kg)  Suspect fatty liver as well- have to lose weight--> I have seen liver cancer from this in the past.   Come fasting to next visit- get an early one

## 2016-05-10 NOTE — Assessment & Plan Note (Signed)
S: she has been out for a short period of the vicodin- uses half tab BID- due to diabetic neuropathy and post herpetic neuralgia. trmadol has helped in past if rough afternoon A/P: refilled vicodin. UDS within a year. NCCSRS reviewed and no concerns.

## 2016-05-10 NOTE — Assessment & Plan Note (Signed)
S: had developed worsening CHF and increase of lasix under cardiology care- feels much bette-r edema now, no shortness of breath with baseline activity- back to lasix 40mg  BID at this point. She declined stress testing advised by cardiology due to prior reaction A/P: I encouraged patient to follow through with cardiology advised stress test- she declines for now. Aware if worsening symptoms to seek care immediatelyj

## 2016-05-10 NOTE — Assessment & Plan Note (Addendum)
S: poorly controlled. On 70/30 insulin- increased morning insulin to 36 and nighttime to 33 units CBGs- no sugars below 100. Most in 200s. None over 300.  Exercise and diet- wants to try low protein drinks which are low carb. Going to start back at the pool.  Lab Results  Component Value Date   HGBA1C 8.9 (H) 03/20/2016   HGBA1C 9.5 (H) 12/16/2015   HGBA1C 8.5 (H) 07/13/2015   A/P: increase insulin 38 units in morning and 34 units in evening. Let me know any sugars below 80. Also with stroke history- cardiovascular/vascular complications- we discussed importance of improving control.

## 2016-05-10 NOTE — Progress Notes (Signed)
Pre visit review using our clinic review tool, if applicable. No additional management support is needed unless otherwise documented below in the visit note. 

## 2016-05-10 NOTE — Assessment & Plan Note (Signed)
S: suspect fatty liver- liver funcion tests up some.  Lab Results  Component Value Date   ALT 39 (H) 03/20/2016   AST 41 (H) 03/20/2016   ALKPHOS 75 03/20/2016   BILITOT 0.7 03/20/2016  A/P: advised weight loss

## 2016-05-16 ENCOUNTER — Ambulatory Visit: Payer: BC Managed Care – PPO | Admitting: Cardiology

## 2016-05-16 ENCOUNTER — Other Ambulatory Visit: Payer: Self-pay | Admitting: Family Medicine

## 2016-05-23 ENCOUNTER — Telehealth: Payer: Self-pay | Admitting: Family Medicine

## 2016-05-23 NOTE — Telephone Encounter (Signed)
Called and spoke with patient. She verbalized understanding of new dosage. She will update as directed or callid blood sugar lower than 70-80

## 2016-05-23 NOTE — Telephone Encounter (Signed)
Pt was last seen on 05-10-16. Pt is calling to report her BS is consistently low end 150 and high end 230. Pt would like permission to increase her morning insulin to 40  Units and evening 38 units. Please advise

## 2016-05-23 NOTE — Telephone Encounter (Signed)
Yes thanks, may increase morning insulin to 40 units. If blood sugars remain above 130- may increase evening dose to 36 units in 2 weeks. Then in 4 weeks update [place] with sugar levels. Call immediately if any blood sugar under 70-80

## 2016-05-25 ENCOUNTER — Telehealth: Payer: Self-pay | Admitting: Family Medicine

## 2016-05-25 NOTE — Telephone Encounter (Signed)
I am happy to have her schedule a visit if she would like

## 2016-05-25 NOTE — Telephone Encounter (Signed)
Pt states she is having same Sx as her son that has mono. Pt knows there is no treatment, but wants to know if you want her to come by and do a lab on Monday?

## 2016-05-30 NOTE — Telephone Encounter (Signed)
Pt states she is feeling much better and doesn't think she needs an appointment.

## 2016-06-30 ENCOUNTER — Other Ambulatory Visit: Payer: Self-pay | Admitting: Family Medicine

## 2016-07-06 ENCOUNTER — Ambulatory Visit (INDEPENDENT_AMBULATORY_CARE_PROVIDER_SITE_OTHER): Payer: BC Managed Care – PPO | Admitting: Family Medicine

## 2016-07-06 ENCOUNTER — Encounter: Payer: Self-pay | Admitting: Family Medicine

## 2016-07-06 VITALS — BP 122/70 | HR 96 | Temp 97.9°F | Ht 64.0 in | Wt 281.8 lb

## 2016-07-06 DIAGNOSIS — E1149 Type 2 diabetes mellitus with other diabetic neurological complication: Secondary | ICD-10-CM | POA: Diagnosis not present

## 2016-07-06 DIAGNOSIS — M25511 Pain in right shoulder: Secondary | ICD-10-CM

## 2016-07-06 LAB — COMPREHENSIVE METABOLIC PANEL
ALT: 34 U/L (ref 0–35)
AST: 32 U/L (ref 0–37)
Albumin: 4.1 g/dL (ref 3.5–5.2)
Alkaline Phosphatase: 86 U/L (ref 39–117)
BILIRUBIN TOTAL: 0.6 mg/dL (ref 0.2–1.2)
BUN: 23 mg/dL (ref 6–23)
CO2: 27 meq/L (ref 19–32)
Calcium: 10.2 mg/dL (ref 8.4–10.5)
Chloride: 99 mEq/L (ref 96–112)
Creatinine, Ser: 1.05 mg/dL (ref 0.40–1.20)
GFR: 55.13 mL/min — AB (ref >60.00)
GLUCOSE: 134 mg/dL — AB (ref 70–99)
POTASSIUM: 4.2 meq/L (ref 3.5–5.1)
SODIUM: 137 meq/L (ref 135–145)
Total Protein: 7.6 g/dL (ref 6.0–8.3)

## 2016-07-06 LAB — CBC
HCT: 36.2 % (ref 36.0–46.0)
HEMOGLOBIN: 12 g/dL (ref 12.0–15.0)
MCHC: 33 g/dL (ref 30.0–36.0)
MCV: 84.3 fl (ref 78.0–100.0)
Platelets: 345 10*3/uL (ref 150.0–400.0)
RBC: 4.3 Mil/uL (ref 3.87–5.11)
RDW: 16.2 % — AB (ref 11.5–15.5)
WBC: 9.2 10*3/uL (ref 4.0–10.5)

## 2016-07-06 LAB — LDL CHOLESTEROL, DIRECT: Direct LDL: 41 mg/dL

## 2016-07-06 LAB — HEMOGLOBIN A1C: Hgb A1c MFr Bld: 9 % — ABNORMAL HIGH (ref 4.6–6.5)

## 2016-07-06 NOTE — Patient Instructions (Addendum)
Increase insulin to 43 units in the AM and 37 units in the PM. Contact me with any #s under 80  See me back 1 month  Contact orthopedist. If they cant see you within lets say 7 business days we can get you in with Dr. [name] of sports medicine  Please stop by lab before you go

## 2016-07-06 NOTE — Progress Notes (Signed)
Subjective:  [name] is a 71 y.o. year old very pleasant female patient who presents for/with See problem oriented charting ROS- complains of right shoulder pain and right knee pain. No chest pain or shortness of breath. Feels some tearing at time from right eye.    Past Medical History-  Patient Active Problem List   Diagnosis Date Noted  . Diastolic CHF ([place]) 64/40/3474    Priority: High  . Chronic pain syndrome 04/20/2009    Priority: High  . CVA (cerebral infarction) 01/05/2008    Priority: High  . Type 2 diabetes mellitus with neurological manifestations, controlled ([place]) 11/01/2005    Priority: High  . Gout 01/04/2015    Priority: Medium  . Osteopenia 07/05/2014    Priority: Medium  . Anemia 11/24/2013    Priority: Medium  . Abnormal transaminases 11/24/2013    Priority: Medium  . CKD (chronic kidney disease), stage III 01/11/2010    Priority: Medium  . Edema 05/13/2009    Priority: Medium  . Obesity 10/23/2007    Priority: Medium  . Depression 11/01/2005    Priority: Medium  . Essential hypertension 11/01/2005    Priority: Medium  . Right hemiplegia ([place]) 03/20/2016    Priority: Low  . Rosacea 11/24/2013    Priority: Low  . Shingles 11/24/2013    Priority: Low  . OA (osteoarthritis) of knee 12/28/2012    Priority: Low  . Ventral hernia 01/17/2010    Priority: Low  . GERD 04/12/2009    Priority: Low  . SOB (shortness of breath) 04/24/2016  . DOE (dyspnea on exertion) 04/24/2016  . Acute on chronic diastolic CHF (congestive heart failure) ([place]) 04/24/2016  . Actinic keratosis 08/11/2015  . Risk for falls 08/11/2015    Medications- reviewed and updated Current Outpatient Prescriptions  Medication Sig Dispense Refill  . colchicine 0.6 MG tablet Take 1 tablet (0.6 mg total) by mouth daily as needed. 90 tablet 1  . docusate sodium (COLACE) 50 MG capsule Take 100 mg by mouth daily.     . furosemide (LASIX) 40 MG tablet TAKE 1 TABLET TWICE DAILY. 180  tablet 1  . GLIPIZIDE XL 10 MG 24 hr tablet TAKE 1 TABLET TWICE DAILY. 180 tablet 1  . glucose blood (ONETOUCH VERIO) test strip Test 4 times daily. Dx E11.9 100 each 12  . HYDROcodone-acetaminophen (NORCO/VICODIN) 5-325 MG tablet Take 1 tablet by mouth daily as needed. 90 tablet 0  . hydrocortisone (ANUSOL-HC) 2.5 % rectal cream Place 1 application rectally 2 (two) times daily as needed for hemorrhoids or itching.    . insulin NPH-regular Human (NOVOLIN 70/30) (70-30) 100 UNIT/ML injection INJECT 31-40 UNITS OF INSULIN twice a day as instructed by physician 8 vial 3  . Insulin Syringe-Needle U-100 (B-D INS SYRINGE 0.5CC/31GX5/16) 31G X 5/16" 0.5 ML MISC Inject insulin twice a day. Patient requests syringe that can use up to 50 units. E11.59 180 each 3  . metFORMIN (GLUCOPHAGE) 1000 MG tablet TAKE 1 TABLET TWICE DAILY WITH FOOD. 180 tablet 1  . methocarbamol (ROBAXIN) 500 MG tablet Take 1 tablet (500 mg total) by mouth every 6 (six) hours as needed for muscle spasms. 80 tablet 0  . metroNIDAZOLE (METROCREAM) 0.75 % cream APPLY TO AFFECTED AREA TWICE A DAY. 45 g 1  . ONE TOUCH LANCETS MISC Test 4 times daily. Dx E11.9 100 each 12  . potassium chloride SA (K-DUR,KLOR-CON) 20 MEQ tablet TAKE 1 TABLET TWICE DAILY. 180 tablet 1  . PRILOSEC OTC 20  MG tablet TAKE 1 TABLET ONCE DAILY. 84 tablet 0  . telmisartan (MICARDIS) 80 MG tablet TAKE 1 TABLET ONCE DAILY. 90 tablet 0  . traMADol (ULTRAM) 50 MG tablet Take 1 tablet (50 mg total) by mouth every 8 (eight) hours as needed. 30 tablet 0  . triamcinolone cream (KENALOG) 0.1 % Apply 1 application topically 2 (two) times daily. For 10 days 30 g 0  . valACYclovir (VALTREX) 1000 MG tablet Take 0.5 tablets (500 mg total) by mouth 3 (three) times daily. 21 tablet 0   No current facility-administered medications for this visit.     Objective: BP 122/70 (BP Location: Left Arm, Patient Position: Sitting, Cuff Size: Large)   Pulse 96   Temp 97.9 F (36.6 C)  (Oral)   Ht 5\' 4"  (1.626 m)   Wt 281 lb 12.8 oz (127.8 kg)   BMI 48.37 kg/m  Gen: NAD, resting comfortably CV: RRR no murmurs rubs or gallops Lungs: CTAB no crackles, wheeze, rhonchi Abdomen: morbid obesity Ext: no edema Skin: warm, dry Neuro: CN II-XII intact, sensation and reflexes normal throughout, 5/5 muscle strength in bilateral upper and lower extremities. Normal finger to nose. Normal rapid alternating movements. No pronator drift. Normal romberg. Normal gait.   Right Shoulder: Inspection reveals no abnormalities, atrophy or asymmetry. Palpation is abnormal with tenderness over  bicipital groove. ROM is limited to about 90 degrees due to pain- able to passively get her past this point.  Rotator cuff strength is limited by pain.  Does not tolerate attempts at evaluating impingement No signs of impingement with negative Neer and Hawkin's tests, empty can. painful arc but no drop arm sign.  Assessment/Plan:  Right shoulder pain S:R shoulder pain x 2 weeks up to 8/10- seemed to start suddenly without fall or injury then worsened to current level of pain. She walks with a walker on right arm and has had to not put as much weight on this due to the shoudler- then right knee started bothering her. Severe pain with lifting her right arm overhead. Minimal pain at rest. Pain in knee with walking. Weeping right eye at time- worried about stroke.  A/P: Patient is very concerned that this is related to a stroke with her stroke history. Her complaints are of pain in the right shoulder that then started with pain in the right knee after she was not able to use her cane as much for support. She has a completely normal neurological exam other than strength limited in right arm due to pain. I told her probability of stroke extremely low- no facial weakness noted. She is to see Dr. Reynaldo Minium next week for orthopedic evaluation- may need to consider steroid injection though I do not like her a1c being 9 at  this time for injection- she is in severe pain and needs relief and with other medical problems/meds/conditions should avoid nsaids and tylenol not working. Suspect bursitis  Type 2 diabetes mellitus with neurological manifestations, controlled ([place]) S: controlled poorly still poorly  70/30 insulin 41 and 36 . Had been on 66 and 33 last time.  CBGs- 103 lowest in morning. Averaging 130s morning- this mornign was higher at 175 Nothing under 100 otherwise. Does not check much during the rest of the day but denies lows Exercise and diet- refused weight- asked nurse to input prior weight Lab Results  Component Value Date   HGBA1C 9.0 (H) 07/06/2016   HGBA1C 8.9 (H) 03/20/2016   HGBA1C 9.5 (H) 12/16/2015   A/P:  A1c essentially stable.  Increase insulin to 43 units in the AM and 37 units in the PM. Contact me with any #s under 80. See me back 1 month.   Extended counseling today. 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 She eats out most nights of the week and often eats out at [employer]- she claims its healthy because she doesn't eat much of the rice. I am going to have Roselyn Reef ask her if she is willing to go to diabetes education.    1 month I am trying to help her by doing visits here instead of seeing endocrine due to cost. If she does not start to make progress on weight and healthier eating we will change this plan. Will have this discussion next visit  Orders Placed This Encounter  Procedures  . CBC    [place]  . Comprehensive metabolic panel    [place]  . Hemoglobin A1c    [place]  . LDL cholesterol, direct    [place]   The duration of face-to-face time during this visit was greater than 25 minutes. Greater than 50% of this time was spent in counseling, explanation of diagnosis, planning of further management, and/or coordination of care including discusison about her concern of stroke, discussion of what a healthy diet actually is  and some habits she should change.     Return precautions advised.  Garret Reddish, MD

## 2016-07-07 NOTE — Assessment & Plan Note (Signed)
S: controlled poorly still poorly  70/30 insulin 41 and 36 . Had been on 50 and 33 last time.  CBGs- 103 lowest in morning. Averaging 130s morning- this mornign was higher at 175 Nothing under 100 otherwise. Does not check much during the rest of the day but denies lows Exercise and diet- refused weight- asked nurse to input prior weight Lab Results  Component Value Date   HGBA1C 9.0 (H) 07/06/2016   HGBA1C 8.9 (H) 03/20/2016   HGBA1C 9.5 (H) 12/16/2015   A/P: A1c essentially stable.  Increase insulin to 43 units in the AM and 37 units in the PM. Contact me with any #s under 80. See me back 1 month.   Extended counseling today. 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 She eats out most nights of the week and often eats out at [employer]- she claims its healthy because she doesn't eat much of the rice. I am going to have Roselyn Reef ask her if she is willing to go to diabetes education.

## 2016-07-11 ENCOUNTER — Other Ambulatory Visit: Payer: Self-pay | Admitting: Family Medicine

## 2016-08-06 ENCOUNTER — Ambulatory Visit: Payer: BC Managed Care – PPO | Admitting: Family Medicine

## 2016-08-13 ENCOUNTER — Ambulatory Visit (INDEPENDENT_AMBULATORY_CARE_PROVIDER_SITE_OTHER): Payer: BC Managed Care – PPO | Admitting: Family Medicine

## 2016-08-13 ENCOUNTER — Encounter: Payer: Self-pay | Admitting: Family Medicine

## 2016-08-13 VITALS — BP 138/70 | HR 97 | Temp 97.9°F

## 2016-08-13 DIAGNOSIS — I10 Essential (primary) hypertension: Secondary | ICD-10-CM

## 2016-08-13 DIAGNOSIS — G894 Chronic pain syndrome: Secondary | ICD-10-CM | POA: Diagnosis not present

## 2016-08-13 DIAGNOSIS — E1149 Type 2 diabetes mellitus with other diabetic neurological complication: Secondary | ICD-10-CM | POA: Diagnosis not present

## 2016-08-13 MED ORDER — FREESTYLE LIBRE SENSOR SYSTEM MISC: 11 refills | Status: AC

## 2016-08-13 MED ORDER — FREESTYLE LIBRE READER DEVI: 1.0000 | Freq: Three times a day (TID) | 0 refills | Status: AC

## 2016-08-13 MED ORDER — HYDROCODONE-ACETAMINOPHEN 5-325 MG PO TABS: 1.0000 | ORAL_TABLET | Freq: Every day | ORAL | 0 refills | Status: DC | PRN

## 2016-08-13 NOTE — Progress Notes (Signed)
Subjective:  [name] is a 71 y.o. year old very pleasant female patient who presents for/with See problem oriented charting ROS- continued issues with chronic pain in area of prior shingles infection as well as from neuropathy, denies shortness of breath, right shoulder pain much better.    Past Medical History-  Patient Active Problem List   Diagnosis Date Noted  . Diastolic CHF ([place]) 16/10/9602    Priority: High  . Chronic pain syndrome 04/20/2009    Priority: High  . CVA (cerebral infarction) 01/05/2008    Priority: High  . Type 2 diabetes mellitus with neurological manifestations, controlled ([place]) 11/01/2005    Priority: High  . Gout 01/04/2015    Priority: Medium  . Osteopenia 07/05/2014    Priority: Medium  . Anemia 11/24/2013    Priority: Medium  . Abnormal transaminases 11/24/2013    Priority: Medium  . CKD (chronic kidney disease), stage III 01/11/2010    Priority: Medium  . Edema 05/13/2009    Priority: Medium  . Obesity 10/23/2007    Priority: Medium  . Depression 11/01/2005    Priority: Medium  . Essential hypertension 11/01/2005    Priority: Medium  . Right hemiplegia ([place]) 03/20/2016    Priority: Low  . Rosacea 11/24/2013    Priority: Low  . Shingles 11/24/2013    Priority: Low  . OA (osteoarthritis) of knee 12/28/2012    Priority: Low  . Ventral hernia 01/17/2010    Priority: Low  . GERD 04/12/2009    Priority: Low  . SOB (shortness of breath) 04/24/2016  . DOE (dyspnea on exertion) 04/24/2016  . Acute on chronic diastolic CHF (congestive heart failure) ([place]) 04/24/2016  . Actinic keratosis 08/11/2015  . Risk for falls 08/11/2015    Medications- reviewed and updated Current Outpatient Prescriptions  Medication Sig Dispense Refill  . colchicine 0.6 MG tablet Take 1 tablet (0.6 mg total) by mouth daily as needed. 90 tablet 1  . docusate sodium (COLACE) 50 MG capsule Take 100 mg by mouth daily.     . furosemide (LASIX) 40 MG tablet TAKE 1  TABLET TWICE DAILY. 180 tablet 1  . GLIPIZIDE XL 10 MG 24 hr tablet TAKE 1 TABLET TWICE DAILY. 180 tablet 1  . glucose blood (ONETOUCH VERIO) test strip Test 4 times daily. Dx E11.9 100 each 12  . HYDROcodone-acetaminophen (NORCO/VICODIN) 5-325 MG tablet Take 1 tablet by mouth daily as needed. 90 tablet 0  . hydrocortisone (ANUSOL-HC) 2.5 % rectal cream Place 1 application rectally 2 (two) times daily as needed for hemorrhoids or itching.    . insulin NPH-regular Human (NOVOLIN 70/30) (70-30) 100 UNIT/ML injection INJECT 31-40 UNITS OF INSULIN twice a day as instructed by physician 8 vial 3  . Insulin Syringe-Needle U-100 (B-D INS SYRINGE 0.5CC/31GX5/16) 31G X 5/16" 0.5 ML MISC Inject insulin twice a day. Patient requests syringe that can use up to 50 units. E11.59 180 each 3  . metFORMIN (GLUCOPHAGE) 1000 MG tablet TAKE 1 TABLET TWICE DAILY WITH FOOD. 180 tablet 1  . methocarbamol (ROBAXIN) 500 MG tablet Take 1 tablet (500 mg total) by mouth every 6 (six) hours as needed for muscle spasms. 80 tablet 0  . metroNIDAZOLE (METROCREAM) 0.75 % cream APPLY TO AFFECTED AREA TWICE A DAY. 45 g 1  . ONE TOUCH LANCETS MISC Test 4 times daily. Dx E11.9 100 each 12  . potassium chloride SA (K-DUR,KLOR-CON) 20 MEQ tablet TAKE 1 TABLET TWICE DAILY. 180 tablet 1  . PRILOSEC OTC  20 MG tablet TAKE 1 TABLET ONCE DAILY. 84 tablet 0  . telmisartan (MICARDIS) 80 MG tablet TAKE 1 TABLET ONCE DAILY. 90 tablet 1  . traMADol (ULTRAM) 50 MG tablet Take 1 tablet (50 mg total) by mouth every 8 (eight) hours as needed. 30 tablet 0  . triamcinolone cream (KENALOG) 0.1 % Apply 1 application topically 2 (two) times daily. For 10 days 30 g 0  . valACYclovir (VALTREX) 1000 MG tablet Take 0.5 tablets (500 mg total) by mouth 3 (three) times daily. 21 tablet 0   No current facility-administered medications for this visit.     Objective: BP 138/70 (BP Location: Left Arm, Patient Position: Sitting, Cuff Size: Large)   Pulse 97    Temp 97.9 F (36.6 C) (Oral)   SpO2 96%  Gen: NAD, resting comfortably CV: RRR no murmurs rubs or gallops Lungs: CTAB no crackles, wheeze, rhonchi Ext: no edema Skin: warm, dry Neuro: walks with cane/brace on right arm- antalgic  Assessment/Plan:  Hypertension S: controlled on lasix 40mg  BID, telmisartan 80mg . Closer today to goal than typical.  BP Readings from Last 3 Encounters:  08/13/16 138/70  07/06/16 122/70  05/10/16 136/66  A/P: blood pressure goal of <140/90. Continue current meds.   Type 2 diabetes mellitus with neurological manifestations, controlled ([place]) S: last a1c was stable at 9. We increased morning insulin to 43 units and evening to 37 units from 41 and 36 of her 70/30 insulin NPH/R. She self titrated to 45 and 40 units after steroid injection for her right shoulder. Unclear weight changes as often denies weight being measured such as today. Still on glipizide 10mg  twice a day. Takes metformin 1 g BID CBGs- 103 in morning has been pretty typical- lowest was 90.  Lab Results  Component Value Date   HGBA1C 9.0 (H) 07/06/2016  A/P: continue metformin, glipizide, and nph/r 70/30 45 AM, 40 PM. Update me with sugars in 3 weeks. She is going to get the freestyle libre meter so she does not have to prick her fingers and thinks this will help her be more compliant with diet and CBG checks. Follow up in office on 10/07/16 or later.   I set a goal for patient of a1c 8 or less within 6 months or I would refer her to endocrine. Endocrine is $50 copay for her but we discussed importance of making some headway on CBG. Also need to discuss diabetes education.   Chronic pain syndrome S: NCCSRS reviewed and only has received hydrocodone from me every 3-4 months. Tramaol one time through me- Only #30. Other neuropathic pain options have not been tolerable like gabapentin, lyrica, cymbalta- affect her cognition.   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 Rare tramadol in  day but vicodin each night.  A/P:refilled medication. May refill every 3 months. Plans to pay out of pocket.   10/07/16 or later. CBG log in 3 weeks.   Meds ordered this encounter  Medications  . HYDROcodone-acetaminophen (NORCO/VICODIN) 5-325 MG tablet    Sig: Take 1 tablet by mouth daily as needed.    Dispense:  90 tablet    Refill:  0  . Continuous Blood Gluc Receiver (FREESTYLE LIBRE READER) DEVI    Sig: 1 each by Does not apply route 3 (three) times daily.    Dispense:  1 Device    Refill:  0  . Continuous Blood Gluc Sensor (FREESTYLE LIBRE SENSOR SYSTEM) MISC    Sig: Please provide enough patches for  1 month (3 patches)    Dispense:  1 each    Refill:  11    Return precautions advised.  Garret Reddish, MD

## 2016-08-13 NOTE — Patient Instructions (Signed)
I want you to check every morning then rotate other checks for total of at least 3 checks a day - 2 hours after each meal - before lunch and dinner -before bed  Refilled medications  Send me blood sugar log in 3 weeks. Can write out or type out  Lab Results  Component Value Date   HGBA1C 9.0 (H) 07/06/2016  Need to get a1c below 8 within 6 months if we are going to continue to care for you outside of endocrine

## 2016-08-13 NOTE — Assessment & Plan Note (Addendum)
S: NCCSRS reviewed and only has received hydrocodone from me every 3-4 months. Tramaol one time through me- Only #30. Other neuropathic pain options have not been tolerable like gabapentin, lyrica, cymbalta- affect her cognition.   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 Rare tramadol in day but vicodin each night.  A/P:refilled medication. May refill every 3 months. Plans to pay out of pocket.

## 2016-08-13 NOTE — Assessment & Plan Note (Addendum)
S: last a1c was stable at 9. We increased morning insulin to 43 units and evening to 37 units from 41 and 36 of her 70/30 insulin NPH/R. She self titrated to 45 and 40 units after steroid injection for her right shoulder. Unclear weight changes as often denies weight being measured such as today. Still on glipizide 10mg  twice a day. Takes metformin 1 g BID CBGs- 103 in morning has been pretty typical- lowest was 90.  Lab Results  Component Value Date   HGBA1C 9.0 (H) 07/06/2016  A/P: continue metformin, glipizide, and nph/r 70/30 45 AM, 40 PM. Update me with sugars in 3 weeks. She is going to get the freestyle libre meter so she does not have to prick her fingers and thinks this will help her be more compliant with diet and CBG checks. Follow up in office on 10/07/16 or later.   I set a goal for patient of a1c 8 or less within 6 months or I would refer her to endocrine. Endocrine is $50 copay for her but we discussed importance of making some headway on CBG. Also need to discuss diabetes education.

## 2016-09-03 ENCOUNTER — Telehealth: Payer: Self-pay | Admitting: Family Medicine

## 2016-09-03 DIAGNOSIS — E1149 Type 2 diabetes mellitus with other diabetic neurological complication: Secondary | ICD-10-CM

## 2016-09-03 NOTE — Telephone Encounter (Signed)
CBG log dropped off.   Fasting 74-134 and usually around 100 (outlier 170. She increased insulin to 48/43 units after having several >230 either before or postmeal. After this had 1 low on first day but otherwise all >100.   Since then only 1 # >200 at 204 after a large breakfast

## 2016-09-03 NOTE — Assessment & Plan Note (Signed)
CBG log dropped off.   Last visit was sent hom eon 45 units in AM and 40 PM of NPH/R 70/30. Using freestyle meter so doesn't have to prick fingers.   Fasting 74-134 and usually around 100 (outlier 170. She increased insulin to 48/43 units after having several >230 either before or postmeal. After this had 1 low on first day but otherwise all >100. Since then only 1 # >200 at 204 after a large breakfast  Follow up planned 10/07/16 or later

## 2016-09-11 ENCOUNTER — Encounter: Payer: Self-pay | Admitting: Family Medicine

## 2016-09-11 ENCOUNTER — Ambulatory Visit (INDEPENDENT_AMBULATORY_CARE_PROVIDER_SITE_OTHER): Payer: BC Managed Care – PPO | Admitting: Family Medicine

## 2016-09-11 VITALS — BP 130/66 | HR 96 | Temp 98.2°F | Ht 64.0 in | Wt 284.0 lb

## 2016-09-11 DIAGNOSIS — E1149 Type 2 diabetes mellitus with other diabetic neurological complication: Secondary | ICD-10-CM

## 2016-09-11 DIAGNOSIS — R140 Abdominal distension (gaseous): Secondary | ICD-10-CM | POA: Diagnosis not present

## 2016-09-11 DIAGNOSIS — I10 Essential (primary) hypertension: Secondary | ICD-10-CM

## 2016-09-11 DIAGNOSIS — R1012 Left upper quadrant pain: Secondary | ICD-10-CM | POA: Diagnosis not present

## 2016-09-11 NOTE — Progress Notes (Signed)
Subjective:  [name] is a 71 y.o. year old very pleasant female patient who presents for/with See problem oriented charting ROS- continued low back pain, no chest pain, stable edema, no worsening shortness of breath, feels bloating, nausea, LUQ pain   Past Medical History-  Patient Active Problem List   Diagnosis Date Noted  . Diastolic CHF ([place]) 98/11/9145    Priority: High  . Chronic pain syndrome 04/20/2009    Priority: High  . CVA (cerebral infarction) 01/05/2008    Priority: High  . Type 2 diabetes mellitus with neurological manifestations, controlled ([place]) 11/01/2005    Priority: High  . Gout 01/04/2015    Priority: Medium  . Osteopenia 07/05/2014    Priority: Medium  . Anemia 11/24/2013    Priority: Medium  . Abnormal transaminases 11/24/2013    Priority: Medium  . CKD (chronic kidney disease), stage III 01/11/2010    Priority: Medium  . Edema 05/13/2009    Priority: Medium  . Obesity 10/23/2007    Priority: Medium  . Depression 11/01/2005    Priority: Medium  . Essential hypertension 11/01/2005    Priority: Medium  . Right hemiplegia ([place]) 03/20/2016    Priority: Low  . Rosacea 11/24/2013    Priority: Low  . Shingles 11/24/2013    Priority: Low  . OA (osteoarthritis) of knee 12/28/2012    Priority: Low  . Ventral hernia 01/17/2010    Priority: Low  . GERD 04/12/2009    Priority: Low  . SOB (shortness of breath) 04/24/2016  . DOE (dyspnea on exertion) 04/24/2016  . Acute on chronic diastolic CHF (congestive heart failure) ([place]) 04/24/2016  . Actinic keratosis 08/11/2015  . Risk for falls 08/11/2015    Medications- reviewed and updated Current Outpatient Prescriptions  Medication Sig Dispense Refill  . colchicine 0.6 MG tablet Take 1 tablet (0.6 mg total) by mouth daily as needed. 90 tablet 1  . Continuous Blood Gluc Receiver (FREESTYLE LIBRE READER) DEVI 1 each by Does not apply route 3 (three) times daily. 1 Device 0  . Continuous Blood Gluc  Sensor ([place]) MISC Please provide enough patches for 1 month (3 patches) 1 each 11  . docusate sodium (COLACE) 50 MG capsule Take 100 mg by mouth daily.     . furosemide (LASIX) 40 MG tablet TAKE 1 TABLET TWICE DAILY. 180 tablet 1  . GLIPIZIDE XL 10 MG 24 hr tablet TAKE 1 TABLET TWICE DAILY. 180 tablet 1  . glucose blood (ONETOUCH VERIO) test strip Test 4 times daily. Dx E11.9 100 each 12  . HYDROcodone-acetaminophen (NORCO/VICODIN) 5-325 MG tablet Take 1 tablet by mouth daily as needed. 90 tablet 0  . hydrocortisone (ANUSOL-HC) 2.5 % rectal cream Place 1 application rectally 2 (two) times daily as needed for hemorrhoids or itching.    . insulin NPH-regular Human (NOVOLIN 70/30) (70-30) 100 UNIT/ML injection INJECT 31-40 UNITS OF INSULIN twice a day as instructed by physician 8 vial 3  . Insulin Syringe-Needle U-100 (B-D INS SYRINGE 0.5CC/31GX5/16) 31G X 5/16" 0.5 ML MISC Inject insulin twice a day. Patient requests syringe that can use up to 50 units. E11.59 180 each 3  . metFORMIN (GLUCOPHAGE) 1000 MG tablet TAKE 1 TABLET TWICE DAILY WITH FOOD. 180 tablet 1  . methocarbamol (ROBAXIN) 500 MG tablet Take 1 tablet (500 mg total) by mouth every 6 (six) hours as needed for muscle spasms. 80 tablet 0  . metroNIDAZOLE (METROCREAM) 0.75 % cream APPLY TO AFFECTED AREA TWICE A  DAY. 45 g 1  . ONE TOUCH LANCETS MISC Test 4 times daily. Dx E11.9 100 each 12  . potassium chloride SA (K-DUR,KLOR-CON) 20 MEQ tablet TAKE 1 TABLET TWICE DAILY. 180 tablet 1  . PRILOSEC OTC 20 MG tablet TAKE 1 TABLET ONCE DAILY. 84 tablet 0  . telmisartan (MICARDIS) 80 MG tablet TAKE 1 TABLET ONCE DAILY. 90 tablet 1  . traMADol (ULTRAM) 50 MG tablet Take 1 tablet (50 mg total) by mouth every 8 (eight) hours as needed. 30 tablet 0  . triamcinolone cream (KENALOG) 0.1 % Apply 1 application topically 2 (two) times daily. For 10 days 30 g 0  . valACYclovir (VALTREX) 1000 MG tablet Take 0.5 tablets (500 mg total)  by mouth 3 (three) times daily. 21 tablet 0   No current facility-administered medications for this visit.     Objective: BP 130/66 (BP Location: Left Arm, Patient Position: Sitting, Cuff Size: Large)   Pulse 96   Temp 98.2 F (36.8 C) (Oral)   Ht 5\' 4"  (1.626 m)   Wt 284 lb (128.8 kg)   SpO2 96%   BMI 48.75 kg/m  Gen: NAD, resting comfortably CV: RRR no murmurs rubs or gallops Lungs: CTAB no crackles, wheeze, rhonchi Abdomen: soft/mild to moderate tenderness LUQ and into epigastric area/nondistended/normal bowel sounds. Morbid obesity. Hernia above umbilicus.  Ext: no edema Skin: warm, dry, no rash over abdomen.   Assessment/Plan:  LUQ abdominal pain - Plan: CT Abdomen Pelvis Wo Contrast Abdominal distension (gaseous) - Plan: CT Abdomen Pelvis Wo Contrast S: LUQ pain for over a month and worsening.  above belly button- bloating also noted prominently. Normal BMs. Nausea with it new. Not better after belching or BM. Mother with ovarian cancer apparently presented with pain in upper abdomen A/P: will get CT - contrast due to allergy  Type 2 diabetes mellitus with neurological manifestations, controlled ([place]) S: on metformin, glipizide nph/r- has titrated up to 48 in the AM and 43 at night. 3 episodes of slightly low sugar in 60s but she states forgot to ate- will work on this.  Nonstick meter helping her. 7 day average on new meter - 152. Fasting for 5 days 83 to 111 fasting. After meals (red lobster got as high as 322) other than that max was 245 A/P: will be back late September and will come in for a1c at that time. Hoping improved. Goal has been if within 6 months for a1c <8- so by [facility]- if not at goal would refer to endocrine again. I have agreed to see her as long as improving.   Essential hypertension At goal. No change in meds  Follow up late September or early october  Orders Placed This Encounter  Procedures  . CT Abdomen Pelvis Wo Contrast    Standing  Status:   Future    Standing Expiration Date:   12/12/2017    Order Specific Question:   Preferred imaging location?    Answer:   [facility]    Order Specific Question:   Radiology Contrast Protocol - do NOT remove file path    Answer:   [email or web address]    Order Specific Question:   Reason for Exam additional comments    Answer:   LUQ pain for over a month worsening, family history ovarian cancer, abdominal bloating. Iodine allergy so no contrast.   Return precautions advised.  Garret Reddish, MD

## 2016-09-11 NOTE — Assessment & Plan Note (Signed)
S: on metformin, glipizide nph/r- has titrated up to 48 in the AM and 43 at night. 3 episodes of slightly low sugar in 60s but she states forgot to ate- will work on this.  Nonstick meter helping her. 7 day average on new meter - 152. Fasting for 5 days 83 to 111 fasting. After meals (red lobster got as high as 322) other than that max was 245 A/P: will be back late September and will come in for a1c at that time. Hoping improved. Goal has been if within 6 months for a1c <8- so by [facility]- if not at goal would refer to endocrine again. I have agreed to see her as long as improving.

## 2016-09-11 NOTE — Assessment & Plan Note (Signed)
At goal. No change in meds

## 2016-09-11 NOTE — Patient Instructions (Addendum)
Lab Results  Component Value Date   HGBA1C 9.0 (H) 07/06/2016  schedule visit 10/07/16 or later  Weight needs to go down for a1c to improve most likely but with sugars looking better- hoping for a1c under 8. Refer back to endocrine if a1c not down within next 2 readings to at least below 8 with goal within a year under 7 Wt Readings from Last 3 Encounters:  09/11/16 284 lb (128.8 kg)  07/06/16 281 lb 12.8 oz (127.8 kg)  05/10/16 282 lb (127.9 kg)   We will call you within a week or two about your referral for CT abdomen/pelvis no contrast planned. If you do not hear within 3 weeks, give [place] a call.

## 2016-09-13 ENCOUNTER — Ambulatory Visit (INDEPENDENT_AMBULATORY_CARE_PROVIDER_SITE_OTHER)
Admission: RE | Admit: 2016-09-13 | Discharge: 2016-09-13 | Disposition: A | Discharge status: Home / Self Care | Payer: BC Managed Care – PPO | Source: Ambulatory Visit | Attending: Family Medicine | Admitting: Family Medicine

## 2016-09-13 DIAGNOSIS — R1012 Left upper quadrant pain: Secondary | ICD-10-CM

## 2016-09-13 DIAGNOSIS — R140 Abdominal distension (gaseous): Secondary | ICD-10-CM

## 2016-09-21 ENCOUNTER — Telehealth: Payer: Self-pay | Admitting: Family Medicine

## 2016-09-21 NOTE — Telephone Encounter (Signed)
Pt  received  What jamie mail to her however the ct scan report was not included. Pt is requesting jamie to mail ct scan to home address. Pt is aware jamie not in office today

## 2016-09-22 ENCOUNTER — Other Ambulatory Visit: Payer: Self-pay | Admitting: Family Medicine

## 2016-09-24 ENCOUNTER — Other Ambulatory Visit: Payer: Self-pay | Admitting: Family Medicine

## 2016-09-24 NOTE — Telephone Encounter (Signed)
Printed out CT results and mailed the report to her. She will have to request actual images through radiology.

## 2016-10-08 ENCOUNTER — Ambulatory Visit: Payer: BC Managed Care – PPO | Admitting: Family Medicine

## 2016-10-20 ENCOUNTER — Other Ambulatory Visit: Payer: Self-pay | Admitting: Family Medicine

## 2016-11-19 ENCOUNTER — Other Ambulatory Visit: Payer: Self-pay | Admitting: Family Medicine

## 2016-11-23 ENCOUNTER — Encounter: Payer: Self-pay | Admitting: Family Medicine

## 2016-11-23 ENCOUNTER — Ambulatory Visit (INDEPENDENT_AMBULATORY_CARE_PROVIDER_SITE_OTHER): Payer: BC Managed Care – PPO | Admitting: Family Medicine

## 2016-11-23 ENCOUNTER — Telehealth: Payer: Self-pay | Admitting: Family Medicine

## 2016-11-23 VITALS — BP 114/70 | HR 97 | Temp 97.9°F | Ht 64.0 in | Wt 281.0 lb

## 2016-11-23 DIAGNOSIS — I10 Essential (primary) hypertension: Secondary | ICD-10-CM | POA: Diagnosis not present

## 2016-11-23 DIAGNOSIS — E1149 Type 2 diabetes mellitus with other diabetic neurological complication: Secondary | ICD-10-CM

## 2016-11-23 DIAGNOSIS — K760 Fatty (change of) liver, not elsewhere classified: Secondary | ICD-10-CM | POA: Diagnosis not present

## 2016-11-23 DIAGNOSIS — G894 Chronic pain syndrome: Secondary | ICD-10-CM

## 2016-11-23 DIAGNOSIS — Z23 Encounter for immunization: Secondary | ICD-10-CM | POA: Diagnosis not present

## 2016-11-23 MED ORDER — HYDROCODONE-ACETAMINOPHEN 5-325 MG PO TABS: 1.0000 | ORAL_TABLET | Freq: Every day | ORAL | 0 refills | Status: AC | PRN

## 2016-11-23 MED ORDER — TRAMADOL HCL 50 MG PO TABS: 50.0000 mg | ORAL_TABLET | Freq: Three times a day (TID) | ORAL | 1 refills | Status: AC | PRN

## 2016-11-23 NOTE — Patient Instructions (Addendum)
[name] will set you up with cologuard and can be mailed to [facility] address I believe  Refilled hydrocodone  Hope you have another great few months in [place]  IF a1c not at least below 9, get you into endocrine in [place]  Please stop by lab before you go

## 2016-11-23 NOTE — Addendum Note (Signed)
Addended by: Frutoso Chase A on: 11/23/2016 02:43 PM   Modules accepted: Orders

## 2016-11-23 NOTE — Assessment & Plan Note (Signed)
UDS 03/22/16. 02/20/16 pain contract.  Due to diabetic neuropathy and post herpetic neuralgia- Vicodin 1 tab per day (1/2 tab BID), no increase. Refilled today. States reasonably controlling pain

## 2016-11-23 NOTE — Assessment & Plan Note (Signed)
Based on last ct results- looks like likely fatty liver potentially. Will check LFTs. Strongly advised weight loss. Question of cirrhosis based on CT.

## 2016-11-23 NOTE — Assessment & Plan Note (Signed)
S: we had made an agreement that if a1c was not under 8 by January or February would refer back to endocrine.   She has been on metformin 1g BID, glipizide, nph/r and has titrate dup to 48 in AM and 43 at night, now up to 51 and 46 units.  CBGs- twice had CBGs into 60s. Most mornings 90s- 130. Last evening was 90, this AM 107.  Exercise and diet- Running in pool in [place]- going back down for 4 months. Weight stable from last a1c. States has been "being good" Lab Results  Component Value Date   HGBA1C 9.0 (H) 07/06/2016   HGBA1C 8.9 (H) 03/20/2016   HGBA1C 9.5 (H) 12/16/2015   A/P: update a1c today

## 2016-11-23 NOTE — Telephone Encounter (Signed)
Patient states that she needs a rx for her insulin NPH-regular Human (NOVOLIN 70/30) (70-30) 100 UNIT/ML injection [facility] with the dosage increase. She is going out of town and worried that she won't get it in time.  [place], [place] [phone] (Phone) [phone] (Fax)

## 2016-11-23 NOTE — Progress Notes (Addendum)
Subjective:  [name] is a 71 y.o. year old very pleasant female patient who presents for/with See problem oriented charting ROS- had 2 episodes hypoglycemia. No chest pain. Feels abdominal fullness. No fever or chills.    Past Medical History-  Patient Active Problem List   Diagnosis Date Noted  . Diastolic CHF ([place]) 16/10/9602    Priority: High  . Chronic pain syndrome 04/20/2009    Priority: High  . CVA (cerebral infarction) 01/05/2008    Priority: High  . Type 2 diabetes mellitus with neurological manifestations, controlled ([place]) 11/01/2005    Priority: High  . Gout 01/04/2015    Priority: Medium  . Osteopenia 07/05/2014    Priority: Medium  . Anemia 11/24/2013    Priority: Medium  . Abnormal transaminases 11/24/2013    Priority: Medium  . CKD (chronic kidney disease), stage III ([place]) 01/11/2010    Priority: Medium  . Edema 05/13/2009    Priority: Medium  . Obesity 10/23/2007    Priority: Medium  . Depression 11/01/2005    Priority: Medium  . Essential hypertension 11/01/2005    Priority: Medium  . Right hemiplegia ([place]) 03/20/2016    Priority: Low  . Rosacea 11/24/2013    Priority: Low  . Shingles 11/24/2013    Priority: Low  . OA (osteoarthritis) of knee 12/28/2012    Priority: Low  . Ventral hernia 01/17/2010    Priority: Low  . GERD 04/12/2009    Priority: Low  . Fatty liver 11/23/2016  . SOB (shortness of breath) 04/24/2016  . DOE (dyspnea on exertion) 04/24/2016  . Acute on chronic diastolic CHF (congestive heart failure) ([place]) 04/24/2016  . Actinic keratosis 08/11/2015  . Risk for falls 08/11/2015    Medications- reviewed and updated Current Outpatient Prescriptions  Medication Sig Dispense Refill  . colchicine 0.6 MG tablet TAKE (1) TABLET DAILY AS NEEDED. 30 tablet 5  . Continuous Blood Gluc Receiver (FREESTYLE LIBRE READER) DEVI 1 each by Does not apply route 3 (three) times daily. 1 Device 0  . Continuous Blood Gluc Sensor ([place]) MISC Please provide enough patches for 1 month (3 patches) 1 each 11  . docusate sodium (COLACE) 50 MG capsule Take 100 mg by mouth daily.     . furosemide (LASIX) 40 MG tablet TAKE 1 TABLET TWICE DAILY. 180 tablet 1  . GLIPIZIDE XL 10 MG 24 hr tablet TAKE 1 TABLET TWICE DAILY. 180 tablet 1  . glucose blood (ONETOUCH VERIO) test strip Test 4 times daily. Dx E11.9 100 each 12  . HYDROcodone-acetaminophen (NORCO/VICODIN) 5-325 MG tablet Take 1 tablet by mouth daily as needed. 90 tablet 0  . hydrocortisone (ANUSOL-HC) 2.5 % rectal cream Place 1 application rectally 2 (two) times daily as needed for hemorrhoids or itching.    . insulin NPH-regular Human (NOVOLIN 70/30) (70-30) 100 UNIT/ML injection INJECT 31-40 UNITS OF INSULIN twice a day as instructed by physician 8 vial 3  . Insulin Syringe-Needle U-100 (B-D INS SYRINGE 0.5CC/31GX5/16) 31G X 5/16" 0.5 ML MISC Inject insulin twice a day. Patient requests syringe that can use up to 50 units. E11.59 180 each 3  . metFORMIN (GLUCOPHAGE) 1000 MG tablet TAKE 1 TABLET TWICE DAILY WITH FOOD. 180 tablet 1  . methocarbamol (ROBAXIN) 500 MG tablet Take 1 tablet (500 mg total) by mouth every 6 (six) hours as needed for muscle spasms. 80 tablet 0  . metroNIDAZOLE (METROCREAM) 0.75 % cream APPLY TO AFFECTED AREA TWICE A DAY. 45 g  1  . ONE TOUCH LANCETS MISC Test 4 times daily. Dx E11.9 100 each 12  . potassium chloride SA (K-DUR,KLOR-CON) 20 MEQ tablet TAKE 1 TABLET TWICE DAILY. 180 tablet 1  . PRILOSEC OTC 20 MG tablet TAKE 1 TABLET ONCE DAILY. 84 tablet 0  . telmisartan (MICARDIS) 80 MG tablet TAKE 1 TABLET ONCE DAILY. 90 tablet 1  . traMADol (ULTRAM) 50 MG tablet Take 1 tablet (50 mg total) by mouth every 8 (eight) hours as needed. 30 tablet 0  . triamcinolone cream (KENALOG) 0.1 % Apply 1 application topically 2 (two) times daily. For 10 days 30 g 0  . valACYclovir (VALTREX) 1000 MG tablet Take 0.5 tablets (500 mg total) by mouth 3 (three)  times daily. 21 tablet 0   No current facility-administered medications for this visit.     Objective: BP 114/70 (BP Location: Left Arm, Patient Position: Sitting, Cuff Size: Large)   Pulse 97   Temp 97.9 F (36.6 C) (Oral)   Ht 5\' 4"  (1.626 m)   Wt 281 lb (127.5 kg)   SpO2 97%   BMI 48.23 kg/m  Gen: NAD, resting comfortably CV: RRR no murmurs rubs or gallops Lungs: CTAB no crackles, wheeze, rhonchi Abdomen: soft/nontender/nondistended.  Morbid central obesity Ext: continued edema Skin: warm, dry Neuro: walks with cane, antalgic gait  Assessment/Plan:  cologuard agrees  Hypertension S: controlled on current medicines as above- micardis 80mg , lasix 40mg  twice a day.  BP Readings from Last 3 Encounters:  11/23/16 114/70  09/11/16 130/66  08/13/16 138/70  A/P: We discussed blood pressure goal of <140/90. Continue current meds  Type 2 diabetes mellitus with neurological manifestations, controlled ([place]) S: we had made an agreement that if a1c was not under 8 by January or February would refer back to endocrine.   She has been on metformin 1g BID, glipizide, nph/r and has titrate dup to 48 in AM and 43 at night, now up to 51 and 46 units.  CBGs- twice had CBGs into 60s. Most mornings 90s- 130. Last evening was 90, this AM 107.  Exercise and diet- Running in pool in [place]- going back down for 4 months. Weight stable from last a1c. States has been "being good" Lab Results  Component Value Date   HGBA1C 9.0 (H) 07/06/2016   HGBA1C 8.9 (H) 03/20/2016   HGBA1C 9.5 (H) 12/16/2015   A/P: update a1c today  Fatty liver Based on last ct results- looks like likely fatty liver potentially. Will check LFTs. Strongly advised weight loss. Question of cirrhosis based on CT.   Chronic pain syndrome UDS 03/22/16. 02/20/16 pain contract.  Due to diabetic neuropathy and post herpetic neuralgia- Vicodin 1 tab per day (1/2 tab BID), no increase. Refilled today. States reasonably controlling  pain  February follow up likely.   Orders Placed This Encounter  Procedures  . Flu vaccine HIGH DOSE PF  . CBC    Standing Status:   Future    Standing Expiration Date:   11/23/2017  . Comprehensive metabolic panel    [place]    Standing Status:   Future    Standing Expiration Date:   11/23/2017  . Hemoglobin A1c    [place]    Standing Status:   Future    Standing Expiration Date:   11/23/2017   [facility] reviewe-d only rx from me for narcotics  Meds ordered this encounter  Medications  . HYDROcodone-acetaminophen (NORCO/VICODIN) 5-325 MG tablet    Sig: Take 1 tablet by mouth daily  as needed.    Dispense:  90 tablet    Refill:  0  . traMADol (ULTRAM) 50 MG tablet    Sig: Take 1 tablet (50 mg total) by mouth every 8 (eight) hours as needed.    Dispense:  30 tablet    Refill:  1    Return precautions advised.  Garret Reddish, MD

## 2016-11-24 LAB — COMPREHENSIVE METABOLIC PANEL
AG RATIO: 1.2 (calc) (ref 1.0–2.5)
ALKALINE PHOSPHATASE (APISO): 70 U/L (ref 33–130)
ALT: 23 U/L (ref 6–29)
AST: 23 U/L (ref 10–35)
Albumin: 4.1 g/dL (ref 3.6–5.1)
BILIRUBIN TOTAL: 0.6 mg/dL (ref 0.2–1.2)
BUN/Creatinine Ratio: 24 (calc) — ABNORMAL HIGH (ref 6–22)
BUN: 27 mg/dL — ABNORMAL HIGH (ref 7–25)
CALCIUM: 9.4 mg/dL (ref 8.6–10.4)
CHLORIDE: 99 mmol/L (ref 98–110)
CO2: 24 mmol/L (ref 20–32)
Creat: 1.14 mg/dL — ABNORMAL HIGH (ref 0.60–0.93)
GLOBULIN: 3.3 g/dL (ref 1.9–3.7)
Glucose, Bld: 132 mg/dL — ABNORMAL HIGH (ref 65–99)
POTASSIUM: 4.5 mmol/L (ref 3.5–5.3)
Sodium: 136 mmol/L (ref 135–146)
Total Protein: 7.4 g/dL (ref 6.1–8.1)

## 2016-11-24 LAB — CBC
HCT: 34.6 % — ABNORMAL LOW (ref 35.0–45.0)
Hemoglobin: 11.6 g/dL — ABNORMAL LOW (ref 11.7–15.5)
MCH: 28.3 pg (ref 27.0–33.0)
MCHC: 33.5 g/dL (ref 32.0–36.0)
MCV: 84.4 fL (ref 80.0–100.0)
MPV: 9.5 fL (ref 7.5–12.5)
PLATELETS: 400 10*3/uL (ref 140–400)
RBC: 4.1 10*6/uL (ref 3.80–5.10)
RDW: 15.1 % — ABNORMAL HIGH (ref 11.0–15.0)
WBC: 11.3 10*3/uL — AB (ref 3.8–10.8)

## 2016-11-24 LAB — HEMOGLOBIN A1C
HEMOGLOBIN A1C: 6.3 %{Hb} — AB (ref <5.7)
MEAN PLASMA GLUCOSE: 134 (calc)
eAG (mmol/L): 7.4 (calc)

## 2016-11-26 ENCOUNTER — Other Ambulatory Visit: Payer: Self-pay

## 2016-11-26 MED ORDER — INSULIN NPH ISOPHANE & REGULAR (70-30) 100 UNIT/ML ~~LOC~~ SUSP: SUBCUTANEOUS | 3 refills | Status: AC

## 2016-11-26 NOTE — Telephone Encounter (Signed)
Patient calling to check on medication refill. Wants to ensure increased dose is sent to pharmacy.

## 2016-11-26 NOTE — Telephone Encounter (Signed)
Medication was sent to pharmacy as requested

## 2016-11-28 ENCOUNTER — Other Ambulatory Visit: Payer: Self-pay | Admitting: Family Medicine

## 2016-12-13 ENCOUNTER — Telehealth: Payer: Self-pay | Admitting: Family Medicine

## 2016-12-13 NOTE — Telephone Encounter (Signed)
[facility] [place] [address] [phone]   Called to advise that the patient is visiting family and needed her [place] however, the 10 day is no longer available only the 14 day is available. Please call the number provided to advise if this is approved.

## 2016-12-14 ENCOUNTER — Other Ambulatory Visit: Payer: Self-pay | Admitting: *Deleted

## 2016-12-14 NOTE — Telephone Encounter (Signed)
Spoke to pharmacy and approved prescription refill.

## 2016-12-14 NOTE — Telephone Encounter (Signed)
MEDICATION: Continuous Blood Gluc Sensor (FREESTYLE LIBRE SENSOR SYSTEM) MISC   PHARMACY:  [facility] [phone]  IS THIS A 90 DAY SUPPLY : no  IS PATIENT OUT OF MEDICATION: yes  IF NOT; HOW MUCH IS LEFT: 0  LAST APPOINTMENT DATE: @10 /26/2018  NEXT APPOINTMENT DATE:@n /a  OTHER COMMENTS: Patient would like 3 month supply if possbility. Patient was admitted to [facility] and discharged.  **Let patient know to contact pharmacy at the end of the day to make sure medication is ready. **  ** Please notify patient to allow 48-72 hours to process**  **Encourage patient to contact the pharmacy for refills or they can request refills through [facility]**

## 2016-12-31 ENCOUNTER — Other Ambulatory Visit: Payer: Self-pay | Admitting: Family Medicine

## 2017-07-17 ENCOUNTER — Other Ambulatory Visit: Payer: Self-pay

## 2019-04-20 IMAGING — CT CT ABD-PELV W/O CM
2 of 5 series · 15 of 46 positions shown, 17 images · non-contrast
Comparison: CT abdomen pelvis of 10/10/2004

CLINICAL DATA: Left upper quadrant pain, abdominal distention

EXAM:
CT ABDOMEN AND PELVIS WITHOUT CONTRAST
TECHNIQUE: Multidetector CT imaging of the abdomen and pelvis was performed
following the standard protocol without IV contrast.

[Series 2: abd/ pelvis · axial · 0.87mm/px · z∈[-461,-81]mm · 12 of 90 slices shown, 14 images]
[im 7/90  soft-tissue]
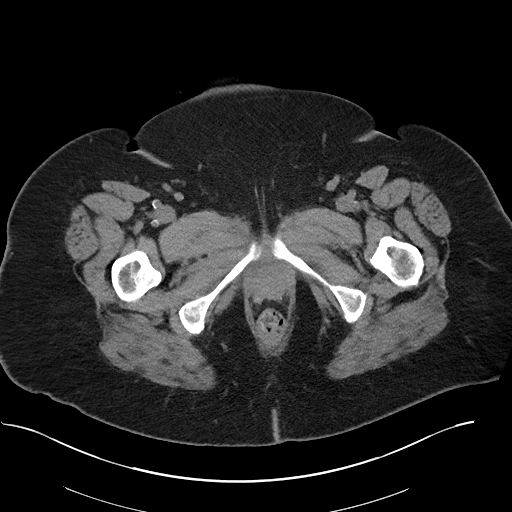
[im 7/90  bone]
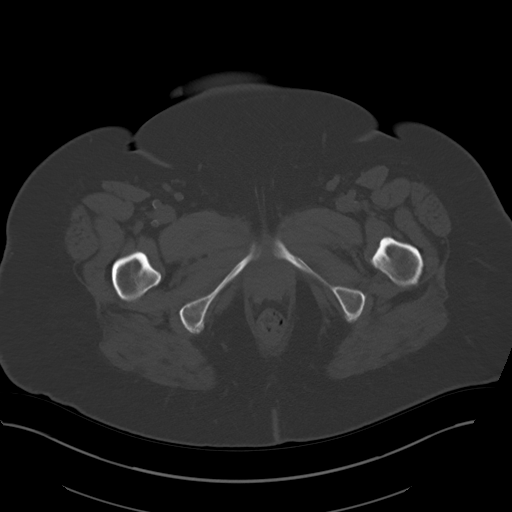
[im 14/90  soft-tissue]
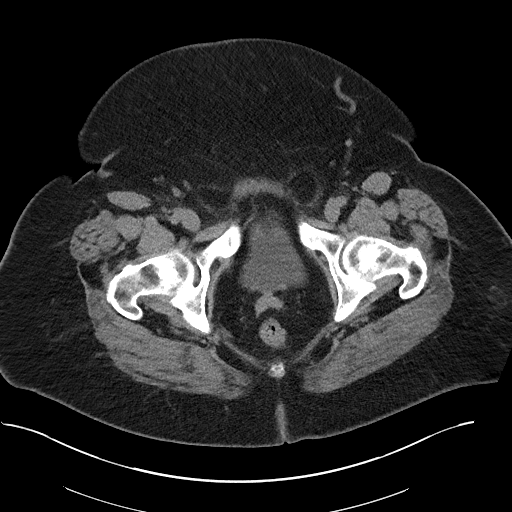
[im 21/90  soft-tissue]
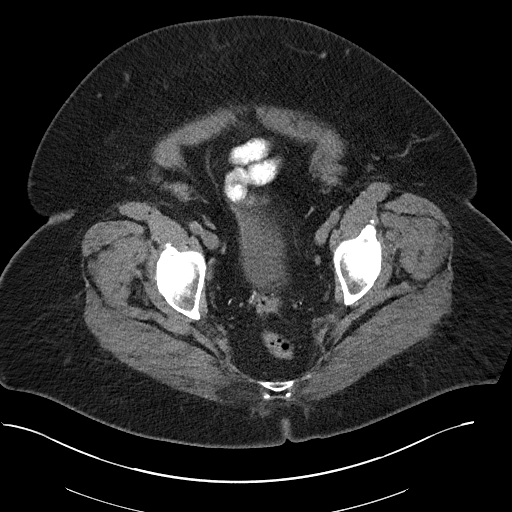
[im 28/90  soft-tissue]
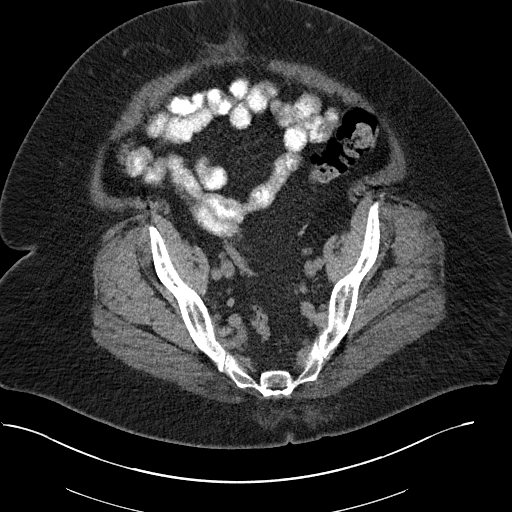
[im 35/90  soft-tissue]
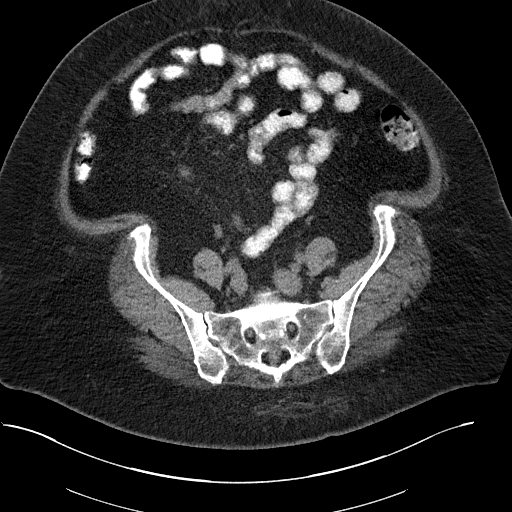
[im 42/90  soft-tissue]
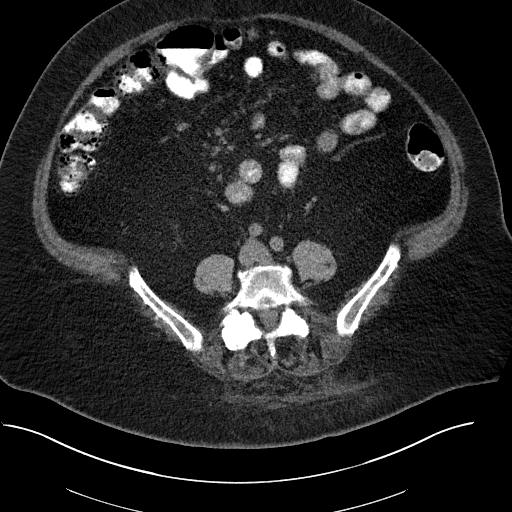
[im 48/90  soft-tissue]
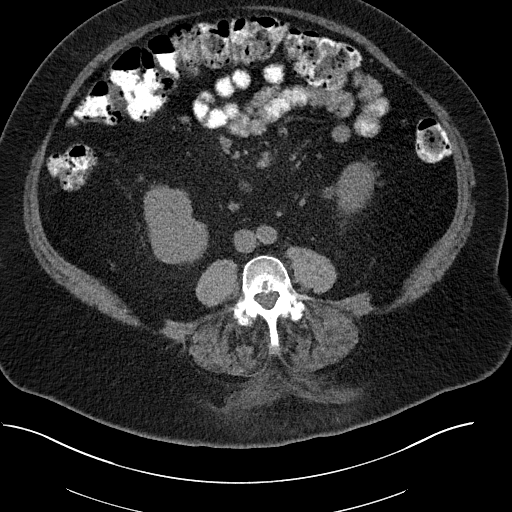
[im 55/90  soft-tissue]
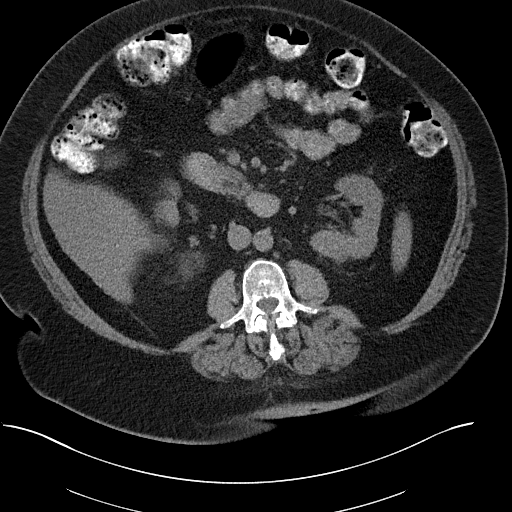
[im 62/90  soft-tissue]
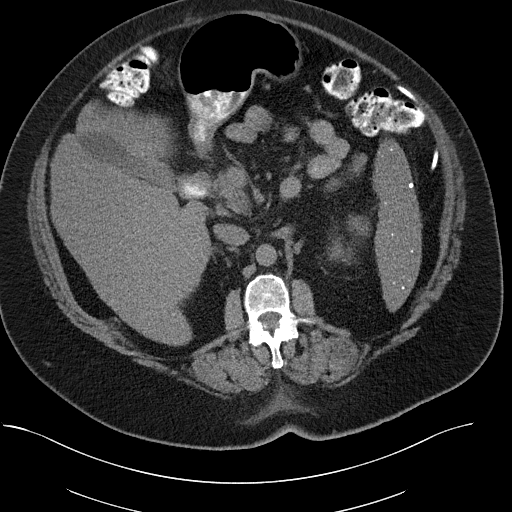
[im 62/90  bone]
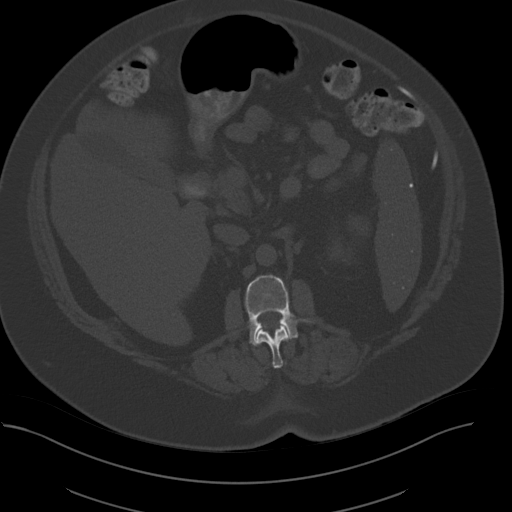
[im 69/90  soft-tissue]
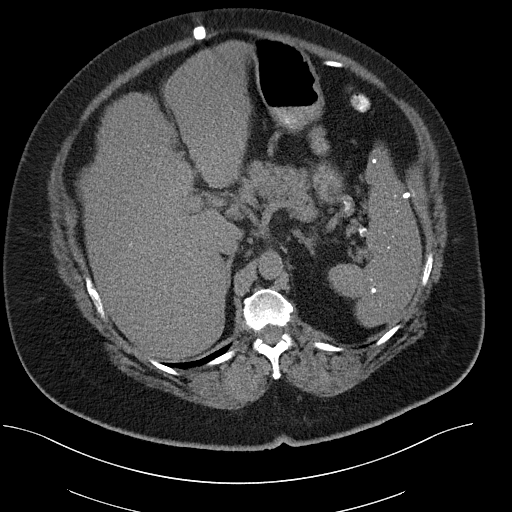
[im 76/90  soft-tissue]
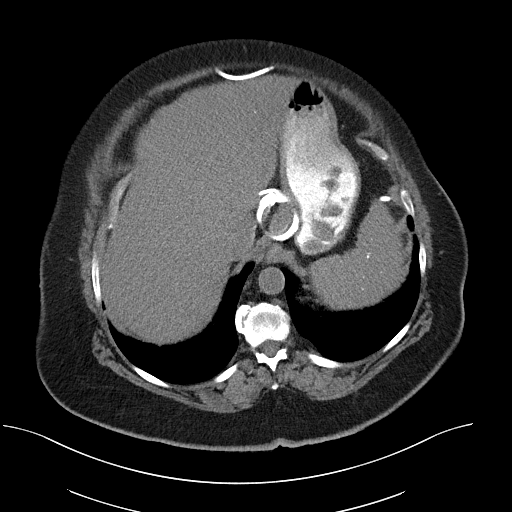
[im 83/90  soft-tissue]
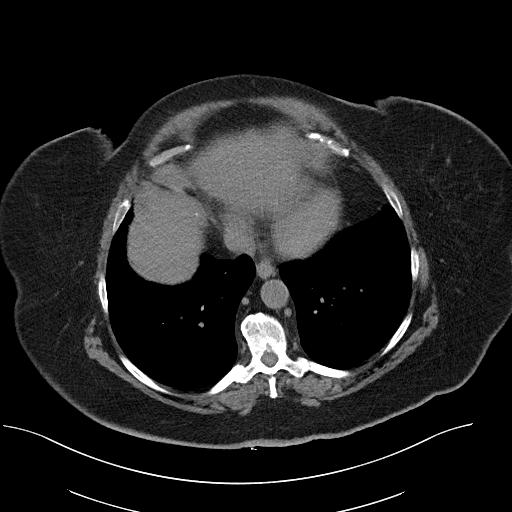

[Series 6: cor st · coronal · 0.81mm/px · 3 of 121 slices shown]
[im 41/121  soft-tissue]
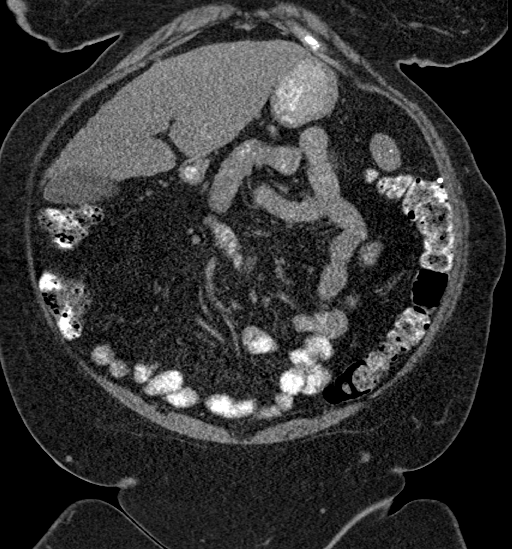
[im 54/121  soft-tissue]
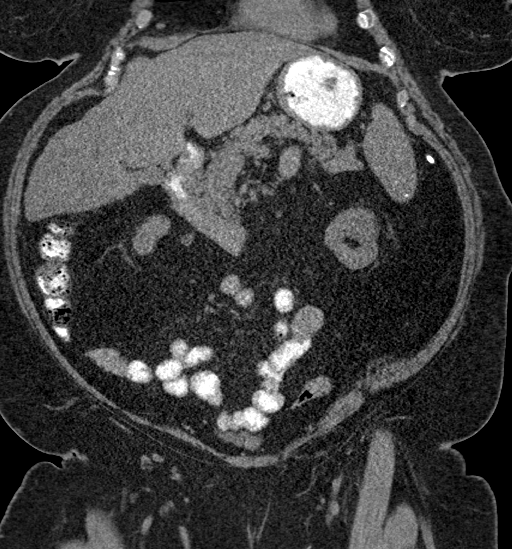
[im 67/121  soft-tissue]
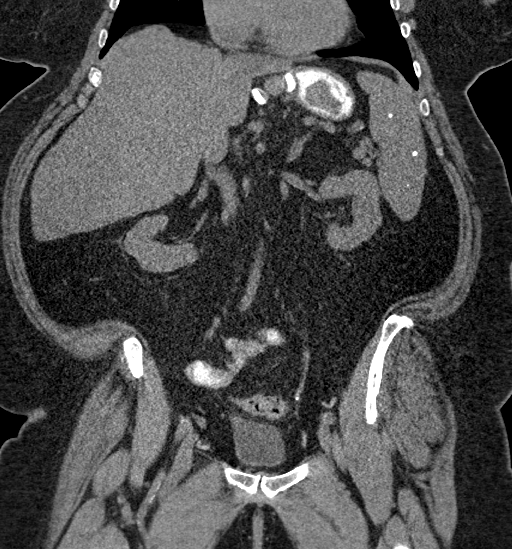

[15 of 46 positions shown; findings below may reference images not displayed]

FINDINGS: Lower chest: The lung bases are clear. The heart is within normal
limits in size.

Hepatobiliary: The multiple hepatic calcified granulomas are present
consistent with prior granulomatous disease. The liver somewhat
nodular and changes of cirrhosis are definite consideration.
Correlate with liver function tests. There are small gallstones
right layering within the gallbladder but the gallbladder wall is
not thickened and the gallbladder is not distended. No
pericholecystic fluid is seen.

Pancreas: The pancreas is normal in size and the pancreatic duct is
not dilated.

Spleen: Multiple calcified splenic granulomas are present from prior
granulomatous disease.

Adrenals/Urinary Tract: The adrenal glands are unremarkable. The
kidneys appear normal in the unenhanced state. No renal mass is
evident, and no hydronephrosis is seen. The ureters are normal in
caliber. The urinary bladder is not well distended but no
abnormality is seen.

Stomach/Bowel: The stomach is not well distended but there is oral
contrast and food debris within the stomach. There is a lap band
present. On the scout film the lap band appears somewhat high in
position and slippage of the band cannot be excluded on this study.
No small bowel distention is seen. There is feces throughout the
nondistended colon. The terminal ileum is unremarkable. No
inflammatory process is seen.

Vascular/Lymphatic: The abdominal aorta is normal in caliber. No
adenopathy is noted.

Reproductive: The uterus has previously been resected. No adnexal
lesion is seen. No free fluid is noted within the pelvis.

Other: There is a small umbilical hernia present containing only
fat.

Musculoskeletal: The lumbar vertebrae are in normal alignment with
no compression deformity and normal intervertebral disc spaces. The
SI joints are corticated.
IMPRESSION: 1. No explanation for the patient's left upper quadrant pain is
seen.
2. The lap band may have changed position somewhat on the scout
film, and slippage of the lap band cannot be excluded.
3. Changes of prior granulomatous disease.
4. Small gallstones layer within the gallbladder.
5. Small umbilical hernia containing fat.
